# Patient Record
Sex: Male | Born: 1945 | Race: Black or African American | Hispanic: No | Marital: Married | Smoking: Former smoker
Health system: Southern US, Community
[De-identification: ages and names within clinical notes are randomized; demographics above are authoritative.]

## PROBLEM LIST (undated history)

## (undated) DIAGNOSIS — Z860100 Personal history of colon polyps, unspecified: Secondary | ICD-10-CM

## (undated) DIAGNOSIS — M1990 Unspecified osteoarthritis, unspecified site: Secondary | ICD-10-CM

## (undated) DIAGNOSIS — G8929 Other chronic pain: Secondary | ICD-10-CM

## (undated) DIAGNOSIS — K5790 Diverticulosis of intestine, part unspecified, without perforation or abscess without bleeding: Secondary | ICD-10-CM

## (undated) DIAGNOSIS — M5030 Other cervical disc degeneration, unspecified cervical region: Secondary | ICD-10-CM

## (undated) DIAGNOSIS — R768 Other specified abnormal immunological findings in serum: Secondary | ICD-10-CM

## (undated) DIAGNOSIS — E119 Type 2 diabetes mellitus without complications: Secondary | ICD-10-CM

## (undated) DIAGNOSIS — N400 Enlarged prostate without lower urinary tract symptoms: Secondary | ICD-10-CM

## (undated) DIAGNOSIS — Z87438 Personal history of other diseases of male genital organs: Secondary | ICD-10-CM

## (undated) DIAGNOSIS — R972 Elevated prostate specific antigen [PSA]: Secondary | ICD-10-CM

## (undated) DIAGNOSIS — H269 Unspecified cataract: Secondary | ICD-10-CM

## (undated) DIAGNOSIS — M545 Low back pain: Secondary | ICD-10-CM

## (undated) DIAGNOSIS — I10 Essential (primary) hypertension: Secondary | ICD-10-CM

## (undated) DIAGNOSIS — Z87442 Personal history of urinary calculi: Secondary | ICD-10-CM

## (undated) DIAGNOSIS — I82409 Acute embolism and thrombosis of unspecified deep veins of unspecified lower extremity: Secondary | ICD-10-CM

## (undated) DIAGNOSIS — D649 Anemia, unspecified: Secondary | ICD-10-CM

## (undated) DIAGNOSIS — E559 Vitamin D deficiency, unspecified: Secondary | ICD-10-CM

## (undated) DIAGNOSIS — H547 Unspecified visual loss: Secondary | ICD-10-CM

## (undated) DIAGNOSIS — N200 Calculus of kidney: Secondary | ICD-10-CM

## (undated) DIAGNOSIS — M519 Unspecified thoracic, thoracolumbar and lumbosacral intervertebral disc disorder: Secondary | ICD-10-CM

## (undated) DIAGNOSIS — K219 Gastro-esophageal reflux disease without esophagitis: Secondary | ICD-10-CM

## (undated) DIAGNOSIS — E785 Hyperlipidemia, unspecified: Secondary | ICD-10-CM

## (undated) DIAGNOSIS — Z972 Presence of dental prosthetic device (complete) (partial): Secondary | ICD-10-CM

## (undated) DIAGNOSIS — J309 Allergic rhinitis, unspecified: Secondary | ICD-10-CM

## (undated) HISTORY — DX: Hyperlipidemia, unspecified: E78.5

## (undated) HISTORY — DX: Calculus of kidney: N20.0

## (undated) HISTORY — DX: Other specified abnormal immunological findings in serum: R76.8

## (undated) HISTORY — PX: WISDOM TOOTH EXTRACTION: SHX21

## (undated) HISTORY — DX: Anemia, unspecified: D64.9

## (undated) HISTORY — DX: Gastro-esophageal reflux disease without esophagitis: K21.9

## (undated) HISTORY — PX: CYST EXCISION: SHX5701

## (undated) HISTORY — DX: Benign prostatic hyperplasia without lower urinary tract symptoms: N40.0

## (undated) HISTORY — DX: Unspecified thoracic, thoracolumbar and lumbosacral intervertebral disc disorder: M51.9

## (undated) HISTORY — DX: Personal history of other diseases of male genital organs: Z87.438

## (undated) HISTORY — DX: Allergic rhinitis, unspecified: J30.9

## (undated) HISTORY — DX: Other cervical disc degeneration, unspecified cervical region: M50.30

## (undated) HISTORY — PX: POLYPECTOMY: SHX149

## (undated) HISTORY — DX: Unspecified visual loss: H54.7

## (undated) HISTORY — PX: PROSTATE BIOPSY: SHX241

## (undated) HISTORY — DX: Essential (primary) hypertension: I10

## (undated) HISTORY — DX: Unspecified cataract: H26.9

## (undated) HISTORY — PX: BACK SURGERY: SHX140

## (undated) HISTORY — DX: Elevated prostate specific antigen (PSA): R97.20

## (undated) HISTORY — DX: Other chronic pain: G89.29

## (undated) HISTORY — DX: Type 2 diabetes mellitus without complications: E11.9

## (undated) HISTORY — DX: Low back pain: M54.5

---

## 2000-11-20 ENCOUNTER — Inpatient Hospital Stay (HOSPITAL_COMMUNITY): Admission: EM | Admit: 2000-11-20 | Discharge: 2000-11-23 | Payer: Self-pay | Admitting: Emergency Medicine

## 2000-11-20 ENCOUNTER — Encounter: Payer: Self-pay | Admitting: Emergency Medicine

## 2000-11-21 ENCOUNTER — Encounter: Payer: Self-pay | Admitting: Internal Medicine

## 2000-11-22 ENCOUNTER — Encounter: Payer: Self-pay | Admitting: Neurological Surgery

## 2000-11-22 ENCOUNTER — Encounter: Payer: Self-pay | Admitting: Internal Medicine

## 2001-03-03 ENCOUNTER — Encounter: Admission: RE | Admit: 2001-03-03 | Discharge: 2001-06-01 | Payer: Self-pay | Admitting: Neurological Surgery

## 2002-05-28 ENCOUNTER — Ambulatory Visit (HOSPITAL_COMMUNITY): Admission: RE | Admit: 2002-05-28 | Discharge: 2002-05-29 | Payer: Self-pay | Admitting: Neurological Surgery

## 2002-05-28 ENCOUNTER — Encounter: Payer: Self-pay | Admitting: Neurological Surgery

## 2002-09-15 ENCOUNTER — Encounter: Payer: Self-pay | Admitting: Internal Medicine

## 2002-09-15 ENCOUNTER — Encounter: Admission: RE | Admit: 2002-09-15 | Discharge: 2002-09-15 | Payer: Self-pay | Admitting: Internal Medicine

## 2005-02-01 HISTORY — PX: COLONOSCOPY: SHX174

## 2010-12-19 ENCOUNTER — Encounter: Payer: Self-pay | Admitting: Internal Medicine

## 2010-12-19 DIAGNOSIS — Z0001 Encounter for general adult medical examination with abnormal findings: Secondary | ICD-10-CM | POA: Insufficient documentation

## 2010-12-19 DIAGNOSIS — Z0000 Encounter for general adult medical examination without abnormal findings: Secondary | ICD-10-CM | POA: Insufficient documentation

## 2010-12-22 ENCOUNTER — Ambulatory Visit: Payer: Self-pay | Admitting: Internal Medicine

## 2011-02-06 ENCOUNTER — Encounter: Payer: Self-pay | Admitting: Internal Medicine

## 2011-02-06 DIAGNOSIS — H547 Unspecified visual loss: Secondary | ICD-10-CM | POA: Insufficient documentation

## 2011-02-06 DIAGNOSIS — M519 Unspecified thoracic, thoracolumbar and lumbosacral intervertebral disc disorder: Secondary | ICD-10-CM

## 2011-02-06 HISTORY — DX: Unspecified thoracic, thoracolumbar and lumbosacral intervertebral disc disorder: M51.9

## 2011-02-06 HISTORY — DX: Unspecified visual loss: H54.7

## 2011-02-08 ENCOUNTER — Ambulatory Visit (INDEPENDENT_AMBULATORY_CARE_PROVIDER_SITE_OTHER): Payer: Self-pay | Admitting: Internal Medicine

## 2011-02-08 ENCOUNTER — Encounter: Payer: Self-pay | Admitting: Internal Medicine

## 2011-02-08 VITALS — BP 120/72 | HR 72 | Temp 97.5°F | Ht 69.0 in | Wt 189.2 lb

## 2011-02-08 DIAGNOSIS — J309 Allergic rhinitis, unspecified: Secondary | ICD-10-CM

## 2011-02-08 DIAGNOSIS — N400 Enlarged prostate without lower urinary tract symptoms: Secondary | ICD-10-CM | POA: Insufficient documentation

## 2011-02-08 DIAGNOSIS — K219 Gastro-esophageal reflux disease without esophagitis: Secondary | ICD-10-CM

## 2011-02-08 DIAGNOSIS — M545 Low back pain, unspecified: Secondary | ICD-10-CM

## 2011-02-08 DIAGNOSIS — R972 Elevated prostate specific antigen [PSA]: Secondary | ICD-10-CM

## 2011-02-08 DIAGNOSIS — G8929 Other chronic pain: Secondary | ICD-10-CM

## 2011-02-08 HISTORY — DX: Other chronic pain: G89.29

## 2011-02-08 HISTORY — DX: Gastro-esophageal reflux disease without esophagitis: K21.9

## 2011-02-08 HISTORY — DX: Allergic rhinitis, unspecified: J30.9

## 2011-02-08 HISTORY — DX: Benign prostatic hyperplasia without lower urinary tract symptoms: N40.0

## 2011-02-08 HISTORY — DX: Low back pain, unspecified: M54.50

## 2011-02-08 HISTORY — DX: Elevated prostate specific antigen (PSA): R97.20

## 2011-02-08 MED ORDER — ASPIRIN 81 MG PO TBEC: 81.0000 mg | DELAYED_RELEASE_TABLET | Freq: Every day | ORAL | Status: AC

## 2011-02-08 MED ORDER — IBUPROFEN 800 MG PO TABS: 800.0000 mg | ORAL_TABLET | Freq: Three times a day (TID) | ORAL | Status: DC | PRN

## 2011-02-08 MED ORDER — LANSOPRAZOLE 30 MG PO CPDR: 30.0000 mg | DELAYED_RELEASE_CAPSULE | Freq: Every day | ORAL | Status: DC

## 2011-02-08 NOTE — Assessment & Plan Note (Addendum)
stable overall by hx and exam, , and pt to continue medical treatment as before   

## 2011-02-08 NOTE — Progress Notes (Signed)
  Subjective:    Patient ID: Vincent [name], male    DOB: [birth year], 66 y.o.   MRN: [id number]  HPI  Here to establish as new pt; overall doing ok,  Pt denies chest pain, increased sob or doe, wheezing, orthopnea, PND, increased LE swelling, palpitations, dizziness or syncope.  Pt denies new neurological symptoms such as new headache, or facial or extremity weakness or numbness   Pt denies polydipsia, polyuria, Pt states overall good compliance with meds, trying to follow lower cholesterol diet, wt overall stable but little exercise however. Sees Dr nesi/urology every 6 mo with psa; s/p 3 prostate biopsy.   Denies urinary symptoms such as dysuria, frequency, urgency,or hematuria.  For flu shot today.  Denies worsening reflux, dysphagia, abd pain, n/v, bowel change or blood but needs PPI refill.   Does have several wks ongoing nasal allergy symptoms with clear congestion, itch and sneeze, without fever, pain, ST, cough or wheezing, but better with meds.  Pt continues to have recurring LBP without change in severity, bowel or bladder change, fever, wt loss,  worsening LE pain/numbness/weakness, gait change or falls. Past Medical History  Diagnosis Date  . Lumbar disc disease 02/06/2011  . 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 Allergic rhinitis, cause unspecified 02/08/2011  . GERD (gastroesophageal reflux disease) 02/08/2011  . Elevated PSA 02/08/2011  . BPH (benign prostatic hypertrophy) 02/08/2011  . 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  reports that he has never smoked. He does not have any smokeless tobacco history on file. He reports that he does not drink alcohol or use illicit drugs. family history includes Blindness in his other; Diabetes in his other; Heart disease in his other; Hypertension in his other; Mental illness in his other; and Sudden death in his other. No Known Allergies No current outpatient prescriptions on file prior to visit.   Review of Systems Review of Systems    Constitutional: Negative for diaphoresis and unexpected weight change.  HENT: Negative for drooling and tinnitus.   Eyes: Negative for photophobia and visual disturbance.  Respiratory: Negative for choking and stridor.   Gastrointestinal: Negative for vomiting and blood in stool.  Genitourinary: Negative for hematuria and decreased urine volume.  Musculoskeletal: Negative for gait problem.  Skin: Negative for color change and wound.  Neurological: Negative for tremors and numbness.  Psychiatric/Behavioral: Negative for decreased concentration. The patient is not hyperactive.       Objective:   Physical Exam BP 120/72  Pulse 72  Temp(Src) 97.5 F (36.4 C) (Oral)  Ht 5\' 9"  (1.753 m)  Wt 189 lb 4 oz (85.843 kg)  BMI 27.95 kg/m2  SpO2 95% Physical Exam  VS noted,  Constitutional: Pt appears well-developed and well-nourished.  HENT: Head: Normocephalic.  Right Ear: External ear normal.  Left Ear: External ear normal.  Eyes: Conjunctivae and EOM are normal. Pupils are equal, round, and reactive to light.  Neck: Normal range of motion. Neck supple.  Cardiovascular: Normal rate and regular rhythm.   Pulmonary/Chest: Effort normal and breath sounds normal.  Abd:  Soft, NT, non-distended, + BS Neurological: Pt is alert. No cranial nerve deficit.  Skin: Skin is warm. No erythema.  Psychiatric: Pt behavior is normal. Thought content normal.     Assessment & Plan:

## 2011-02-08 NOTE — Assessment & Plan Note (Signed)
stable overall by hx and exam, and pt to continue medical treatment as before 

## 2011-02-08 NOTE — Assessment & Plan Note (Signed)
Mild to mod, for OTC allegra prn,  to f/u any worsening symptoms or concerns 

## 2011-02-08 NOTE — Patient Instructions (Addendum)
Please sign Release of Information Form to get records from Dr Eula Listen We will try to get last colonscopy record from Dr Alvis Lemmings had the flu shot today Please start the Aspirin 81 mg - 1 per day - COATED only Your ibuprofen refill was done today Please return in May 2013, or sooner if needed

## 2011-04-06 ENCOUNTER — Telehealth: Payer: Self-pay | Admitting: Internal Medicine

## 2011-04-06 NOTE — Telephone Encounter (Signed)
Received copies from [facility] ,on3/4/13 . Forwarded  15 pages to Dr Jonny Ruiz. ,for review.

## 2011-04-11 ENCOUNTER — Encounter: Payer: Self-pay | Admitting: Internal Medicine

## 2011-04-11 DIAGNOSIS — R768 Other specified abnormal immunological findings in serum: Secondary | ICD-10-CM | POA: Insufficient documentation

## 2011-04-11 DIAGNOSIS — M5030 Other cervical disc degeneration, unspecified cervical region: Secondary | ICD-10-CM

## 2011-04-11 DIAGNOSIS — Z87438 Personal history of other diseases of male genital organs: Secondary | ICD-10-CM

## 2011-04-11 DIAGNOSIS — D649 Anemia, unspecified: Secondary | ICD-10-CM

## 2011-04-11 HISTORY — DX: Other cervical disc degeneration, unspecified cervical region: M50.30

## 2011-04-11 HISTORY — DX: Other specified abnormal immunological findings in serum: R76.8

## 2011-04-11 HISTORY — DX: Anemia, unspecified: D64.9

## 2011-04-11 HISTORY — DX: Personal history of other diseases of male genital organs: Z87.438

## 2011-05-26 ENCOUNTER — Telehealth: Payer: Self-pay | Admitting: Gastroenterology

## 2011-05-26 NOTE — Telephone Encounter (Signed)
Wrong patient error...em

## 2011-06-08 ENCOUNTER — Ambulatory Visit: Payer: 59 | Admitting: Internal Medicine

## 2011-06-11 ENCOUNTER — Ambulatory Visit: Payer: 59 | Admitting: Internal Medicine

## 2011-07-02 ENCOUNTER — Other Ambulatory Visit (INDEPENDENT_AMBULATORY_CARE_PROVIDER_SITE_OTHER): Payer: 59

## 2011-07-02 ENCOUNTER — Other Ambulatory Visit: Payer: Self-pay | Admitting: Internal Medicine

## 2011-07-02 ENCOUNTER — Ambulatory Visit (INDEPENDENT_AMBULATORY_CARE_PROVIDER_SITE_OTHER): Payer: 59 | Admitting: Internal Medicine

## 2011-07-02 ENCOUNTER — Encounter: Payer: Self-pay | Admitting: Internal Medicine

## 2011-07-02 VITALS — BP 110/78 | HR 72 | Temp 97.4°F | Ht 69.5 in | Wt 174.0 lb

## 2011-07-02 DIAGNOSIS — Z125 Encounter for screening for malignant neoplasm of prostate: Secondary | ICD-10-CM

## 2011-07-02 DIAGNOSIS — R972 Elevated prostate specific antigen [PSA]: Secondary | ICD-10-CM

## 2011-07-02 DIAGNOSIS — Z0000 Encounter for general adult medical examination without abnormal findings: Secondary | ICD-10-CM

## 2011-07-02 DIAGNOSIS — Z79899 Other long term (current) drug therapy: Secondary | ICD-10-CM

## 2011-07-02 DIAGNOSIS — J309 Allergic rhinitis, unspecified: Secondary | ICD-10-CM

## 2011-07-02 LAB — CBC WITH DIFFERENTIAL/PLATELET
Basophils Relative: 0.4 % (ref 0.0–3.0)
Eosinophils Absolute: 0.5 10*3/uL (ref 0.0–0.7)
Eosinophils Relative: 11.9 % — ABNORMAL HIGH (ref 0.0–5.0)
HCT: 41.4 % (ref 39.0–52.0)
Hemoglobin: 13.1 g/dL (ref 13.0–17.0)
Lymphs Abs: 1.8 10*3/uL (ref 0.7–4.0)
MCHC: 31.7 g/dL (ref 30.0–36.0)
MCV: 87.4 fl (ref 78.0–100.0)
Monocytes Absolute: 0.4 10*3/uL (ref 0.1–1.0)
Neutro Abs: 1.6 10*3/uL (ref 1.4–7.7)
Neutrophils Relative %: 37.5 % — ABNORMAL LOW (ref 43.0–77.0)
RBC: 4.74 Mil/uL (ref 4.22–5.81)
WBC: 4.3 10*3/uL — ABNORMAL LOW (ref 4.5–10.5)

## 2011-07-02 LAB — URINALYSIS, ROUTINE W REFLEX MICROSCOPIC
Bilirubin Urine: NEGATIVE
Hgb urine dipstick: NEGATIVE
Ketones, ur: NEGATIVE
Leukocytes, UA: NEGATIVE
Nitrite: NEGATIVE
Urobilinogen, UA: 0.2 (ref 0.0–1.0)
pH: 6 (ref 5.0–8.0)

## 2011-07-02 LAB — HEPATIC FUNCTION PANEL
ALT: 16 U/L (ref 0–53)
Albumin: 3.7 g/dL (ref 3.5–5.2)
Bilirubin, Direct: 0.1 mg/dL (ref 0.0–0.3)
Total Protein: 7.2 g/dL (ref 6.0–8.3)

## 2011-07-02 LAB — BASIC METABOLIC PANEL
BUN: 12 mg/dL (ref 6–23)
Chloride: 104 mEq/L (ref 96–112)
Creatinine, Ser: 0.8 mg/dL (ref 0.4–1.5)
GFR: 117.78 mL/min (ref >60.00)
Potassium: 4.2 mEq/L (ref 3.5–5.1)

## 2011-07-02 LAB — LIPID PANEL
Cholesterol: 163 mg/dL (ref 0–200)
Triglycerides: 51 mg/dL (ref 0.0–149.0)
VLDL: 10.2 mg/dL (ref 0.0–40.0)

## 2011-07-02 LAB — PSA: PSA: 10.09 ng/mL — ABNORMAL HIGH (ref 0.10–4.00)

## 2011-07-02 NOTE — Patient Instructions (Signed)
Please try allegra 180 mg per day and mucinex twice per day as needed for the left inner ear stopping up (probably related to mild allergies) OK to try the "ear wax removal kit" for the wax to the right ear Continue all other medications as before Please have the pharmacy call with any refills you may need. Please continue your efforts at being more active, low cholesterol diet, and weight control. Please go to LAB in the Basement for the blood and/or urine tests to be done today You will be contacted by phone if any changes need to be made immediately.  Otherwise, you will receive a letter about your results with an explanation. You are otherwise up to date with prevention Please return in 1 year for your yearly visit, or sooner if needed, with Lab testing done 3-5 days before

## 2011-07-03 ENCOUNTER — Encounter: Payer: Self-pay | Admitting: Internal Medicine

## 2011-07-03 NOTE — Progress Notes (Signed)
Subjective:    Patient ID: Vincent [name], male    DOB: May 13, 1945, 67 y.o.   MRN: [id number]  HPI  Here for wellness and f/u;  Overall doing ok;  Pt denies CP, worsening SOB, DOE, wheezing, orthopnea, PND, worsening LE edema, palpitations, dizziness or syncope.  Pt denies neurological change such as new Headache, facial or extremity weakness.  Pt denies polydipsia, polyuria, or low sugar symptoms. Pt states overall good compliance with treatment and medications, good tolerability, and trying to follow lower cholesterol diet.  Pt denies worsening depressive symptoms, suicidal ideation or panic. No fever, wt loss, night sweats, loss of appetite, or other constitutional symptoms.  Pt states good ability with ADL's, low fall risk, home safety reviewed and adequate, no significant changes in hearing or vision, and occasionally active with exercise.  Does have left ear popping and crackling, in addition to mild nasal allergy symtpoms. Past Medical History  Diagnosis Date  . Lumbar disc disease 02/06/2011  . 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 Allergic rhinitis, cause unspecified 02/08/2011  . GERD (gastroesophageal reflux disease) 02/08/2011  . Elevated PSA 02/08/2011  . BPH (benign prostatic hypertrophy) 02/08/2011  . 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 History of prostatitis 04/11/2011  . 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 Anemia, unspecified 04/11/2011   No past surgical history on file.  reports that he has never smoked. He does not have any smokeless tobacco history on file. He reports that he does not drink alcohol or use illicit drugs. family history includes Blindness in his other; Diabetes in his other; Heart disease in his other; Hypertension in his other; Mental illness in his other; and Sudden death in his other. No Known Allergies Current Outpatient Prescriptions on File Prior to Visit  Medication Sig Dispense Refill  . aspirin 81 MG EC tablet Take 1  tablet (81 mg total) by mouth daily. Swallow whole.  30 tablet  12  . lansoprazole (PREVACID) 30 MG capsule Take 1 capsule (30 mg total) by mouth daily.  90 capsule  3   Review of Systems Review of Systems  Constitutional: Negative for diaphoresis, activity change, appetite change and unexpected weight change.  HENT: Negative for hearing loss, ear pain, facial swelling, mouth sores and neck stiffness.   Eyes: Negative for pain, redness and visual disturbance.  Respiratory: Negative for shortness of breath and wheezing.   Cardiovascular: Negative for chest pain and palpitations.  Gastrointestinal: Negative for diarrhea, blood in stool, abdominal distention and rectal pain.  Genitourinary: Negative for hematuria, flank pain and decreased urine volume.  Musculoskeletal: Negative for myalgias and joint swelling.  Skin: Negative for color change and wound.  Neurological: Negative for syncope and numbness.  Hematological: Negative for adenopathy.  Psychiatric/Behavioral: Negative for hallucinations, self-injury, decreased concentration and agitation.      Objective:   Physical Exam BP 110/78  Pulse 72  Temp(Src) 97.4 F (36.3 C) (Oral)  Ht 5' 9.5" (1.765 m)  Wt 174 lb (78.926 kg)  BMI 25.33 kg/m2  SpO2 99% Physical Exam  VS noted, not ill appaering Constitutional: Pt is oriented to person, place, and time. Appears well-developed and well-nourished.  HENT:  Head: Normocephalic and atraumatic.  Right Ear: External ear normal.  Left Ear: External ear normal.  Nose: Nose normal.  Mouth/Throat: Oropharynx is clear and moist. Bilat tm's mild erythema.  Sinus nontender.  Pharynx mild erythema  Right canal with mild wax impaction Eyes: Conjunctivae and EOM are normal. Pupils are  equal, round, and reactive to light.  Neck: Normal range of motion. Neck supple. No JVD present. No tracheal deviation present.  Cardiovascular: Normal rate, regular rhythm, normal heart sounds and intact distal  pulses.   Pulmonary/Chest: Effort normal and breath sounds normal.  Abdominal: Soft. Bowel sounds are normal. There is no tenderness.  Musculoskeletal: Normal range of motion. Exhibits no edema.  Lymphadenopathy:  Has no cervical adenopathy.  Neurological: Pt is alert and oriented to person, place, and time. Pt has normal reflexes. No cranial nerve deficit. Motor intact  Skin: Skin is warm and dry. No rash noted.  Psychiatric:  Has  normal mood and affect. Behavior is normal.     Assessment & Plan:

## 2011-07-03 NOTE — Assessment & Plan Note (Signed)

## 2011-07-03 NOTE — Assessment & Plan Note (Signed)
For otc allegra prn,  to f/u any worsening symptoms or concerns  

## 2012-02-01 ENCOUNTER — Ambulatory Visit (INDEPENDENT_AMBULATORY_CARE_PROVIDER_SITE_OTHER): Payer: 59 | Admitting: Family Medicine

## 2012-02-01 VITALS — BP 109/78 | HR 108 | Temp 101.0°F | Resp 18 | Ht 69.5 in | Wt 174.2 lb

## 2012-02-01 DIAGNOSIS — R509 Fever, unspecified: Secondary | ICD-10-CM

## 2012-02-01 LAB — POCT CBC
Hemoglobin: 13.9 g/dL — AB (ref 14.1–18.1)
Lymph, poc: 0.9 (ref 0.6–3.4)
MCH, POC: 27 pg (ref 27–31.2)
MCHC: 30.1 g/dL — AB (ref 31.8–35.4)
MPV: 8.6 fL (ref 0–99.8)
POC LYMPH PERCENT: 14.6 %L (ref 10–50)
POC MID %: 6.8 %M (ref 0–12)
RDW, POC: 13.9 %
WBC: 6 10*3/uL (ref 4.6–10.2)

## 2012-02-01 LAB — POCT URINALYSIS DIPSTICK
Blood, UA: NEGATIVE
Glucose, UA: NEGATIVE
Nitrite, UA: NEGATIVE
Urobilinogen, UA: 1
pH, UA: 5.5

## 2012-02-01 LAB — POCT UA - MICROSCOPIC ONLY
Casts, Ur, LPF, POC: NEGATIVE
Mucus, UA: POSITIVE
Yeast, UA: NEGATIVE

## 2012-02-01 MED ORDER — CEPHALEXIN 500 MG PO CAPS: 500.0000 mg | ORAL_CAPSULE | Freq: Four times a day (QID) | ORAL | Status: DC

## 2012-02-01 MED ORDER — OSELTAMIVIR PHOSPHATE 75 MG PO CAPS: 75.0000 mg | ORAL_CAPSULE | Freq: Two times a day (BID) | ORAL | Status: DC

## 2012-02-01 MED ORDER — IBUPROFEN 200 MG PO TABS: 600.0000 mg | ORAL_TABLET | Freq: Once | ORAL | Status: DC

## 2012-02-01 NOTE — Patient Instructions (Signed)
Alternate 650mg  (2 tabs of the regular strength) of tylenol with 600mg  (3 tabs) of ibuprofen every 3 hours so that you are taking medicine every 6 hours.  This will help keep your fever down and aches away.

## 2012-02-01 NOTE — Progress Notes (Signed)
Subjective:    Patient ID: Vincent [name], male    DOB: 06-28-1945, 66 y.o.   MRN: [id number] Chief Complaint  Patient presents with  . Fever    Started yesterday  . Cough  . Generalized Body Aches    HPI  Vincent [name] is a 66 yo male with a history significant for blindness, hepatitis B, and a h/o HepB. He was in his normal state of health until about 24 hrs previously when he developed a HA, myalgias - esp in legs and arms, and fever/chills. Today his temp was up to 102 at home so they decided to come in. He did not get flu shot this year and his wife has also been feeling ill.  No neck stiffness or pain, no upper respiratory sxs in general, no nasal sxs, no sore throat, is having a dry cough. No abd pain, no n/v.  Is urinating more frequent, no dysuria or hesitency.   Has not taken any medications today for sx relief including ibuprofen or tylenol today.  Has not been drinking or eating much today, just no appetite, just has had some OJ but not much else since breakfast, normal BM, feels weak when stands, no DOE, SHoB, no CP. Past Medical History  Diagnosis Date  . Lumbar disc disease 02/06/2011  . 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 Allergic rhinitis, cause unspecified 02/08/2011  . GERD (gastroesophageal reflux disease) 02/08/2011  . Elevated PSA 02/08/2011  . BPH (benign prostatic hypertrophy) 02/08/2011  . 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 History of prostatitis 04/11/2011  . 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 Anemia, unspecified 04/11/2011   Current Outpatient Prescriptions on File Prior to Visit  Medication Sig Dispense Refill  . aspirin 81 MG EC tablet Take 1 tablet (81 mg total) by mouth daily. Swallow whole.  30 tablet  12  . lansoprazole (PREVACID) 30 MG capsule Take 1 capsule (30 mg total) by mouth daily.  90 capsule  3   No Known Allergies   Review of Systems  Constitutional: Positive for fever, chills, diaphoresis, activity  change, appetite change and fatigue. Negative for unexpected weight change.  HENT: Negative for ear pain, nosebleeds, congestion, sore throat, rhinorrhea, sneezing, neck pain, neck stiffness and sinus pressure.   Respiratory: Positive for cough. Negative for chest tightness, shortness of breath and wheezing.   Cardiovascular: Negative for chest pain.  Gastrointestinal: Negative for nausea, vomiting, abdominal pain, diarrhea and constipation.  Genitourinary: Negative for dysuria.  Musculoskeletal: Positive for myalgias and arthralgias. Negative for back pain, joint swelling and gait problem.  Skin: Negative for rash.  Neurological: Positive for headaches. Negative for syncope.  Hematological: Negative for adenopathy.  Psychiatric/Behavioral: Negative for sleep disturbance.      BP 118/82  Pulse 111  Temp 101 F (38.3 C) (Oral)  Resp 18  Ht 5' 9.5" (1.765 m)  Wt 174 lb 3.2 oz (79.017 kg)  BMI 25.36 kg/m2  SpO2 93% Objective:   Physical Exam  Constitutional: He is oriented to person, place, and time. He appears well-developed and well-nourished. No distress.  HENT:  Head: Normocephalic and atraumatic.  Right Ear: Tympanic membrane, external ear and ear canal normal.  Left Ear: Tympanic membrane, external ear and ear canal normal.  Nose: Nose normal.  Mouth/Throat: Oropharynx is clear and moist and mucous membranes are normal. No oropharyngeal exudate.  Neck: Normal range of motion. Neck supple. No thyromegaly present.  Cardiovascular: Normal rate, regular rhythm, normal heart sounds  and intact distal pulses.   Pulmonary/Chest: Effort normal and breath sounds normal. No respiratory distress.  Lymphadenopathy:       Head (right side): Submandibular adenopathy present.       Head (left side): Submandibular adenopathy present.    He has cervical adenopathy.       Right cervical: Superficial cervical adenopathy present.       Right: No supraclavicular adenopathy present.       Left:  No supraclavicular adenopathy present.  Neurological: He is alert and oriented to person, place, and time.  Skin: Skin is warm and dry. He is not diaphoretic. No erythema.  Psychiatric: He has a normal mood and affect. His behavior is normal.       Given 600mg  ibuprofen po x 1 in office now.  Temp came down to 99.5 and O2 sat up to 96%. Still tachycardic with low BP. Results for orders placed in visit on 02/01/12  POCT INFLUENZA A/B      Component Value Range   Influenza A, POC Negative     Influenza B, POC Negative    POCT CBC      Component Value Range   WBC 6.0  4.6 - 10.2 K/uL   Lymph, poc 0.9  0.6 - 3.4   POC LYMPH PERCENT 14.6  10 - 50 %L   MID (cbc) 0.4  0 - 0.9   POC MID % 6.8  0 - 12 %M   POC Granulocyte 4.7  2 - 6.9   Granulocyte percent 78.6  37 - 80 %G   RBC 5.14  4.69 - 6.13 M/uL   Hemoglobin 13.9 (*) 14.1 - 18.1 g/dL   HCT, POC 16.1  09.6 - 53.7 %   MCV 89.9  80 - 97 fL   MCH, POC 27.0  27 - 31.2 pg   MCHC 30.1 (*) 31.8 - 35.4 g/dL   RDW, POC 04.5     Platelet Count, POC 181  142 - 424 K/uL   MPV 8.6  0 - 99.8 fL  POCT UA - MICROSCOPIC ONLY      Component Value Range   WBC, Ur, HPF, POC 1-8     RBC, urine, microscopic 0-3     Bacteria, U Microscopic 1+     Mucus, UA pos     Epithelial cells, urine per micros 0-1     Crystals, Ur, HPF, POC neg     Casts, Ur, LPF, POC neg     Yeast, UA neg    POCT URINALYSIS DIPSTICK      Component Value Range   Color, UA amber     Clarity, UA clear     Glucose, UA neg     Bilirubin, UA small     Ketones, UA 15     Spec Grav, UA 1.020     Blood, UA neg     pH, UA 5.5     Protein, UA 30     Urobilinogen, UA 1.0     Nitrite, UA neg     Leukocytes, UA Trace      Assessment & Plan:  Fever - Unknown origin. I truly think it is flu despite neg test. UClx pending. Due to h/o UTI and prostatitis will cover w/ Keflex (chosen since it would cover pulm pathogens as well) as well as starting Tamiflu. Alt tylenol and ibuprofen  every 3 hrs to suppress fever. Push fluids - importance of this reinforced and pt agrees - does not  need any nausea medicine for this.  Fast-track card given for recheck in 1-2d - if diagnosis still unclear, I would consider repeating flu test or checking a CXR.  F/u tomorrow if not able to drink much or any decrease in urination.

## 2012-02-04 ENCOUNTER — Ambulatory Visit (INDEPENDENT_AMBULATORY_CARE_PROVIDER_SITE_OTHER): Payer: 59 | Admitting: Emergency Medicine

## 2012-02-04 VITALS — BP 118/82 | HR 80 | Temp 98.4°F | Resp 18 | Ht 69.5 in | Wt 181.0 lb

## 2012-02-04 DIAGNOSIS — J111 Influenza due to unidentified influenza virus with other respiratory manifestations: Secondary | ICD-10-CM

## 2012-02-04 LAB — URINE CULTURE: Colony Count: 10000

## 2012-02-04 NOTE — Progress Notes (Signed)
[facility] [address], [place] [address] [phone]  Date:  02/04/2012   Name:  Vincent [name]   DOB:  1945-11-20   MRN:  [id number]  PCP:  Oliver Barre, MD    Chief Complaint: Follow-up   History of Present Illness:  Vincent [name] is a 67 y.o. very pleasant male patient who presents with the following:  Seen on 02/01/12 with influenza and on tamiflu.  Tolerating medication and is improving clinically.  No further fever or cough.  No nausea or vomiting.  No wheezing or shortness of breath.  Eating well.  Patient Active Problem List  Diagnosis  . Preventative health care  . Lumbar disc disease  . Blindness  . Allergic rhinitis, cause unspecified  . GERD (gastroesophageal reflux disease)  . Elevated PSA  . BPH (benign prostatic hypertrophy)  . Chronic LBP  . History of prostatitis  . Hepatitis B antibody positive  . DDD (degenerative disc disease), cervical  . Anemia, unspecified    Past Medical History  Diagnosis Date  . Lumbar disc disease 02/06/2011  . 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 Allergic rhinitis, cause unspecified 02/08/2011  . GERD (gastroesophageal reflux disease) 02/08/2011  . Elevated PSA 02/08/2011  . BPH (benign prostatic hypertrophy) 02/08/2011  . 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 History of prostatitis 04/11/2011  . 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 Anemia, unspecified 04/11/2011    No past surgical history on file.  History  Substance Use Topics  . Smoking status: Never Smoker   . Smokeless tobacco: Not on file  . Alcohol Use: No    Family History  Problem Relation Age of Onset  . Blindness Other   . Heart disease Other   . Hypertension Other   . Diabetes Other   . Mental illness Other   . Sudden death Other     No Known Allergies  Medication list has been reviewed and updated.  Current Outpatient Prescriptions on File Prior to Visit  Medication Sig  Dispense Refill  . aspirin 81 MG EC tablet Take 1 tablet (81 mg total) by mouth daily. Swallow whole.  30 tablet  12  . cephALEXin (KEFLEX) 500 MG capsule Take 1 capsule (500 mg total) by mouth 4 (four) times daily.  28 capsule  0  . lansoprazole (PREVACID) 30 MG capsule Take 1 capsule (30 mg total) by mouth daily.  90 capsule  3  . oseltamivir (TAMIFLU) 75 MG capsule Take 1 capsule (75 mg total) by mouth 2 (two) times daily.  10 capsule  0    Review of Systems:  As per HPI, otherwise negative.    Physical Examination: Filed Vitals:   02/04/12 1654  BP: 118/82  Pulse: 80  Temp: 98.4 F (36.9 C)  Resp: 18   Filed Vitals:   02/04/12 1654  Height: 5' 9.5" (1.765 m)  Weight: 181 lb (82.101 kg)   Body mass index is 26.35 kg/(m^2). Ideal Body Weight: Weight in (lb) to have BMI = 25: 171.4   GEN: WDWN, NAD, Non-toxic, A & O x 3 HEENT: Atraumatic, Normocephalic. Neck supple. No masses, No LAD. Ears and Nose: No external deformity. CV: RRR, No M/G/R. No JVD. No thrill. No extra heart sounds. PULM: CTA B, no wheezes, crackles, rhonchi. No retractions. No resp. distress. No accessory muscle use. ABD: S, NT, ND, +BS. No rebound. No HSM. EXTR: No c/c/e NEURO Normal gait.  PSYCH: Normally interactive. Conversant.  Not depressed or anxious appearing.  Calm demeanor.    Assessment and Plan: Influenza Follow up as needed  Carmelina Dane, MD

## 2012-08-01 ENCOUNTER — Encounter: Payer: Self-pay | Admitting: Internal Medicine

## 2012-08-01 ENCOUNTER — Ambulatory Visit (INDEPENDENT_AMBULATORY_CARE_PROVIDER_SITE_OTHER): Payer: 59 | Admitting: Internal Medicine

## 2012-08-01 ENCOUNTER — Other Ambulatory Visit (INDEPENDENT_AMBULATORY_CARE_PROVIDER_SITE_OTHER): Payer: Medicare Other

## 2012-08-01 VITALS — BP 132/80 | HR 59 | Temp 97.0°F | Ht 69.5 in | Wt 181.5 lb

## 2012-08-01 DIAGNOSIS — D649 Anemia, unspecified: Secondary | ICD-10-CM

## 2012-08-01 DIAGNOSIS — N400 Enlarged prostate without lower urinary tract symptoms: Secondary | ICD-10-CM

## 2012-08-01 DIAGNOSIS — Z0000 Encounter for general adult medical examination without abnormal findings: Secondary | ICD-10-CM

## 2012-08-01 DIAGNOSIS — R972 Elevated prostate specific antigen [PSA]: Secondary | ICD-10-CM

## 2012-08-01 DIAGNOSIS — Z23 Encounter for immunization: Secondary | ICD-10-CM

## 2012-08-01 LAB — LIPID PANEL
Cholesterol: 183 mg/dL (ref 0–200)
HDL: 44.3 mg/dL (ref >39.00)
LDL Cholesterol: 114 mg/dL — ABNORMAL HIGH (ref 0–99)
Triglycerides: 125 mg/dL (ref 0.0–149.0)
VLDL: 25 mg/dL (ref 0.0–40.0)

## 2012-08-01 LAB — CBC WITH DIFFERENTIAL/PLATELET
Basophils Relative: 0.3 % (ref 0.0–3.0)
Eosinophils Absolute: 0.2 10*3/uL (ref 0.0–0.7)
MCHC: 32.5 g/dL (ref 30.0–36.0)
MCV: 88.6 fl (ref 78.0–100.0)
Monocytes Absolute: 0.6 10*3/uL (ref 0.1–1.0)
Neutro Abs: 2.2 10*3/uL (ref 1.4–7.7)
Neutrophils Relative %: 45.5 % (ref 43.0–77.0)
RBC: 4.77 Mil/uL (ref 4.22–5.81)

## 2012-08-01 LAB — URINALYSIS, ROUTINE W REFLEX MICROSCOPIC
Hgb urine dipstick: NEGATIVE
Ketones, ur: NEGATIVE
Nitrite: NEGATIVE
Urine Glucose: NEGATIVE

## 2012-08-01 LAB — HEPATIC FUNCTION PANEL
Albumin: 4 g/dL (ref 3.5–5.2)
Bilirubin, Direct: 0.1 mg/dL (ref 0.0–0.3)
Total Protein: 7.7 g/dL (ref 6.0–8.3)

## 2012-08-01 LAB — BASIC METABOLIC PANEL
Chloride: 105 mEq/L (ref 96–112)
Potassium: 4.1 mEq/L (ref 3.5–5.1)
Sodium: 139 mEq/L (ref 135–145)

## 2012-08-01 LAB — PSA: PSA: 12.48 ng/mL — ABNORMAL HIGH (ref 0.10–4.00)

## 2012-08-01 MED ORDER — LANSOPRAZOLE 30 MG PO CPDR: 30.0000 mg | DELAYED_RELEASE_CAPSULE | Freq: Every day | ORAL | Status: DC | PRN

## 2012-08-01 MED ORDER — ASPIRIN 81 MG PO TBEC: 81.0000 mg | DELAYED_RELEASE_TABLET | Freq: Every day | ORAL | Status: AC

## 2012-08-01 NOTE — Patient Instructions (Signed)
You had the pneumonia shot today Please continue all other medications as before, the prevacid Please have the pharmacy call with any other refills you may need. Please also start Aspirin 81 mg - 1 per day (Enteric coated only) Please continue your efforts at being more active, low cholesterol diet, and weight control. You are otherwise up to date with prevention measures today. Please go to the LAB in the Basement (turn left off the elevator) for the tests to be done today You will be contacted by phone if any changes need to be made immediately.  Otherwise, you will receive a letter about your results with an explanation, but please check with MyChart first.  Please remember to sign up for My Chart if you have not done so, as this will be important to you in the future with finding out test results, communicating by private email, and scheduling acute appointments online when needed.  Please return in 1 year for your yearly visit, or sooner if needed

## 2012-08-01 NOTE — Assessment & Plan Note (Addendum)
Overall doing well, age appropriate education and counseling updated, referrals for preventative services and immunizations addressed, dietary and smoking counseling addressed, most recent labs reviewed.  I have personally reviewed and have noted: 1) the patient's medical and social history 2) The pt's use of alcohol, tobacco, and illicit drugs 3) The patient's current medications and supplements 4) Functional ability including ADL's, fall risk, home safety risk, hearing and visual impairment 5) Diet and physical activities 6) Evidence for depression or mood disorder 7) The patient's height, weight, and BMI have been recorded in the chart I have made referrals, and provided counseling and education based on review of the above ECG reviewed as per emr To start asa 81 mg, for pnuemovax today, and lab f/u.

## 2012-08-01 NOTE — Progress Notes (Signed)
Subjective:    Patient ID: Vincent [name], male    DOB: [birth year], 67 y.o.   MRN: [id number]  HPI  Here for wellness and f/u;  Overall doing ok;  Pt denies CP, worsening SOB, DOE, wheezing, orthopnea, PND, worsening LE edema, palpitations, dizziness or syncope.  Pt denies neurological change such as new headache, facial or extremity weakness.  Pt denies polydipsia, polyuria, or low sugar symptoms. Pt states overall good compliance with treatment and medications, good tolerability, and has been trying to follow lower cholesterol diet.  Pt denies worsening depressive symptoms, suicidal ideation or panic. No fever, night sweats, wt loss, loss of appetite, or other constitutional symptoms.  Pt states good ability with ADL's, has low fall risk, home safety reviewed and adequate, no other significant changes in hearing or vision, and only occasionally active with exercise.  No acute complaints Past Medical History  Diagnosis Date  . Lumbar disc disease 02/06/2011  . 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 Allergic rhinitis, cause unspecified 02/08/2011  . GERD (gastroesophageal reflux disease) 02/08/2011  . Elevated PSA 02/08/2011  . BPH (benign prostatic hypertrophy) 02/08/2011  . 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 History of prostatitis 04/11/2011  . 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 Anemia, unspecified 04/11/2011   No past surgical history on file.  reports that he has never smoked. He does not have any smokeless tobacco history on file. He reports that he does not drink alcohol or use illicit drugs. family history includes Blindness in his other; Diabetes in his other; Heart disease in his other; Hypertension in his other; Mental illness in his other; and Sudden death in his other. No Known Allergies No current outpatient prescriptions on file prior to visit.   No current facility-administered medications on file prior to visit.   Review of  Systems Constitutional: Negative for diaphoresis, activity change, appetite change or unexpected weight change.  HENT: Negative for hearing loss, ear pain, facial swelling, mouth sores and neck stiffness.   Eyes: Negative for pain, redness and visual disturbance.  Respiratory: Negative for shortness of breath and wheezing.   Cardiovascular: Negative for chest pain and palpitations.  Gastrointestinal: Negative for diarrhea, blood in stool, abdominal distention or other pain Genitourinary: Negative for hematuria, flank pain or change in urine volume.  Musculoskeletal: Negative for myalgias and joint swelling.  Skin: Negative for color change and wound.  Neurological: Negative for syncope and numbness. other than noted Hematological: Negative for adenopathy.  Psychiatric/Behavioral: Negative for hallucinations, self-injury, decreased concentration and agitation.      Objective:   Physical Exam BP 132/80  Pulse 59  Temp(Src) 97 F (36.1 C) (Oral)  Ht 5' 9.5" (1.765 m)  Wt 181 lb 8 oz (82.328 kg)  BMI 26.43 kg/m2  SpO2 98% VS noted,  Constitutional: Pt is oriented to person, place, and time. Appears well-developed and well-nourished.  Head: Normocephalic and atraumatic.  Right Ear: External ear normal.  Left Ear: External ear normal.  Nose: Nose normal.  Mouth/Throat: Oropharynx is clear and moist.  Eyes: Conjunctivae and EOM are normal. Pupils are equal, round, and reactive to light.  Neck: Normal range of motion. Neck supple. No JVD present. No tracheal deviation present.  Cardiovascular: Normal rate, regular rhythm, normal heart sounds and intact distal pulses.   Pulmonary/Chest: Effort normal and breath sounds normal.  Abdominal: Soft. Bowel sounds are normal. There is no tenderness. No HSM  Musculoskeletal: Normal range of motion. Exhibits no edema.  Lymphadenopathy:  Has no cervical adenopathy.  Neurological: Pt is alert and oriented to person, place, and time. Pt has normal  reflexes. No cranial nerve deficit.  Skin: Skin is warm and dry. No rash noted.  Psychiatric:  Has  normal mood and affect. Behavior is normal.     Assessment & Plan:

## 2012-08-02 ENCOUNTER — Encounter: Payer: Self-pay | Admitting: Internal Medicine

## 2012-12-05 ENCOUNTER — Other Ambulatory Visit: Payer: Self-pay | Admitting: Internal Medicine

## 2012-12-05 ENCOUNTER — Encounter (HOSPITAL_COMMUNITY): Payer: Self-pay | Admitting: Emergency Medicine

## 2012-12-05 ENCOUNTER — Emergency Department (HOSPITAL_COMMUNITY)
Admission: EM | Admit: 2012-12-05 | Discharge: 2012-12-05 | Disposition: A | Discharge status: Home / Self Care | Payer: Medicare Other | Attending: Emergency Medicine | Admitting: Emergency Medicine

## 2012-12-05 DIAGNOSIS — M5030 Other cervical disc degeneration, unspecified cervical region: Secondary | ICD-10-CM | POA: Insufficient documentation

## 2012-12-05 DIAGNOSIS — N419 Inflammatory disease of prostate, unspecified: Secondary | ICD-10-CM

## 2012-12-05 DIAGNOSIS — K219 Gastro-esophageal reflux disease without esophagitis: Secondary | ICD-10-CM | POA: Insufficient documentation

## 2012-12-05 DIAGNOSIS — N410 Acute prostatitis: Secondary | ICD-10-CM | POA: Insufficient documentation

## 2012-12-05 DIAGNOSIS — R894 Abnormal immunological findings in specimens from other organs, systems and tissues: Secondary | ICD-10-CM | POA: Insufficient documentation

## 2012-12-05 DIAGNOSIS — Z79899 Other long term (current) drug therapy: Secondary | ICD-10-CM | POA: Insufficient documentation

## 2012-12-05 DIAGNOSIS — H543 Unqualified visual loss, both eyes: Secondary | ICD-10-CM | POA: Insufficient documentation

## 2012-12-05 DIAGNOSIS — M545 Low back pain, unspecified: Secondary | ICD-10-CM | POA: Insufficient documentation

## 2012-12-05 DIAGNOSIS — Z7982 Long term (current) use of aspirin: Secondary | ICD-10-CM | POA: Insufficient documentation

## 2012-12-05 DIAGNOSIS — D649 Anemia, unspecified: Secondary | ICD-10-CM | POA: Insufficient documentation

## 2012-12-05 DIAGNOSIS — G8929 Other chronic pain: Secondary | ICD-10-CM | POA: Insufficient documentation

## 2012-12-05 DIAGNOSIS — M519 Unspecified thoracic, thoracolumbar and lumbosacral intervertebral disc disorder: Secondary | ICD-10-CM | POA: Insufficient documentation

## 2012-12-05 LAB — POCT I-STAT, CHEM 8
BUN: 17 mg/dL (ref 6–23)
Calcium, Ion: 1.21 mmol/L (ref 1.13–1.30)
Chloride: 104 mEq/L (ref 96–112)
HCT: 45 % (ref 39.0–52.0)
Hemoglobin: 15.3 g/dL (ref 13.0–17.0)
TCO2: 23 mmol/L (ref 0–100)

## 2012-12-05 LAB — CBC WITH DIFFERENTIAL/PLATELET
Basophils Absolute: 0 10*3/uL (ref 0.0–0.1)
Basophils Relative: 0 % (ref 0–1)
Eosinophils Absolute: 0 10*3/uL (ref 0.0–0.7)
Eosinophils Relative: 0 % (ref 0–5)
HCT: 39.4 % (ref 39.0–52.0)
Hemoglobin: 13.4 g/dL (ref 13.0–17.0)
Lymphocytes Relative: 14 % (ref 12–46)
MCH: 28.9 pg (ref 26.0–34.0)
MCHC: 34 g/dL (ref 30.0–36.0)
MCV: 84.9 fL (ref 78.0–100.0)
Monocytes Absolute: 0.3 10*3/uL (ref 0.1–1.0)
Monocytes Relative: 3 % (ref 3–12)
Platelets: 153 10*3/uL (ref 150–400)
RDW: 13.5 % (ref 11.5–15.5)
WBC: 8.4 10*3/uL (ref 4.0–10.5)

## 2012-12-05 LAB — URINE MICROSCOPIC-ADD ON

## 2012-12-05 LAB — URINALYSIS, ROUTINE W REFLEX MICROSCOPIC
Glucose, UA: 250 mg/dL — AB
Ketones, ur: 40 mg/dL — AB
Protein, ur: NEGATIVE mg/dL
Urobilinogen, UA: 0.2 mg/dL (ref 0.0–1.0)

## 2012-12-05 LAB — OCCULT BLOOD, POC DEVICE: Fecal Occult Bld: NEGATIVE

## 2012-12-05 MED ORDER — CIPROFLOXACIN HCL 500 MG PO TABS: 500.0000 mg | ORAL_TABLET | Freq: Two times a day (BID) | ORAL | Status: DC

## 2012-12-05 MED ORDER — HYDROCODONE-ACETAMINOPHEN 5-325 MG PO TABS
1.0000 | ORAL_TABLET | Freq: Once | ORAL | Status: AC
  Administered 2012-12-05: 1 via ORAL
  Filled 2012-12-05: qty 1

## 2012-12-05 MED ORDER — CIPROFLOXACIN HCL 500 MG PO TABS
500.0000 mg | ORAL_TABLET | Freq: Once | ORAL | Status: AC
  Administered 2012-12-05: 500 mg via ORAL
  Filled 2012-12-05: qty 1

## 2012-12-05 MED ORDER — HYDROCODONE-ACETAMINOPHEN 5-325 MG PO TABS: 1.0000 | ORAL_TABLET | Freq: Four times a day (QID) | ORAL | Status: DC | PRN

## 2012-12-05 NOTE — ED Notes (Signed)
Assisted NP with rectal exam. Pt tolerated well.

## 2012-12-05 NOTE — ED Provider Notes (Signed)
CSN: [id number]     Arrival date & time 12/05/12  0302 History   First MD Initiated Contact with Patient 12/05/12 0319     Chief Complaint  Patient presents with  . Back Pain  . Abdominal Pain   (Consider location/radiation/quality/duration/timing/severity/associated sxs/prior Treatment) HPI Comments: Patient presents tonight with low abdominal pain, dysuria.  That started this evening.  Denies any fever, constipation, difficulty urinating.  He does, state, that he's had 2 bowel movements since the pain started.  He has a history of chronic low back pain.  GERD.  Enlarged prostate prostatitis anemia, degenerative disc disease, and blindness.  Patient is a 67 y.o. male presenting with back pain and abdominal pain. The history is provided by the patient.  Back Pain Location:  Lumbar spine Quality:  Unable to specify Radiates to:  Does not radiate Pain severity:  Mild Onset quality:  Sudden Duration: Chronic low back pain. Associated symptoms: abdominal pain   Associated symptoms: no dysuria, no fever and no weakness   Abdominal Pain Associated symptoms: no chills, no dysuria, no fever, no nausea and no vomiting     Past Medical History  Diagnosis Date  . Lumbar disc disease 02/06/2011  . 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 Allergic rhinitis, cause unspecified 02/08/2011  . GERD (gastroesophageal reflux disease) 02/08/2011  . Elevated PSA 02/08/2011  . BPH (benign prostatic hypertrophy) 02/08/2011  . 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 History of prostatitis 04/11/2011  . 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 Anemia, unspecified 04/11/2011   Past Surgical History  Procedure Laterality Date  . Back surgery    . Cyst excision     Family History  Problem Relation Age of Onset  . Blindness Other   . Heart disease Other   . Hypertension Other   . Diabetes Other   . Mental illness Other   . Sudden death Other    History  Substance Use Topics  .  Smoking status: Never Smoker   . Smokeless tobacco: Not on file  . Alcohol Use: No    Review of Systems  Constitutional: Negative for fever and chills.  Gastrointestinal: Positive for abdominal pain. Negative for nausea and vomiting.  Genitourinary: Positive for flank pain. Negative for dysuria, frequency and decreased urine volume.  Musculoskeletal: Positive for back pain.  Neurological: Negative for dizziness and weakness.  All other systems reviewed and are negative.    Allergies  Review of patient's allergies indicates no known allergies.  Home Medications   Current Outpatient Rx  Name  Route  Sig  Dispense  Refill  . aspirin 81 MG EC tablet   Oral   Take 1 tablet (81 mg total) by mouth daily. Swallow whole.   30 tablet   12   . CRANBERRY PO   Oral   Take 2 capsules by mouth daily.         . lansoprazole (PREVACID) 30 MG capsule   Oral   Take 1 capsule (30 mg total) by mouth daily as needed.   90 capsule   3   . Multiple Vitamins-Minerals (MULTIVITAMIN WITH MINERALS) tablet   Oral   Take 1 tablet by mouth daily.         . ciprofloxacin (CIPRO) 500 MG tablet   Oral   Take 1 tablet (500 mg total) by mouth 2 (two) times daily.   28 tablet   0   . HYDROcodone-acetaminophen (NORCO/VICODIN) 5-325 MG per tablet  Oral   Take 1 tablet by mouth every 6 (six) hours as needed for pain.   20 tablet   0    BP 125/83  Pulse 78  Temp(Src) 97.7 F (36.5 C) (Oral)  Resp 24  Ht 5' 9.5" (1.765 m)  Wt 175 lb (79.379 kg)  BMI 25.48 kg/m2  SpO2 100% Physical Exam  Nursing note and vitals reviewed. Constitutional: He is oriented to person, place, and time. He appears well-developed and well-nourished.  HENT:  Head: Normocephalic.  Neck: Normal range of motion.  Cardiovascular: Normal rate and regular rhythm.   Pulmonary/Chest: Effort normal and breath sounds normal.  Abdominal: Soft. Bowel sounds are normal. He exhibits no distension. There is tenderness in  the suprapubic area.  Genitourinary: Rectum normal. Rectal exam shows no external hemorrhoid, no internal hemorrhoid, no fissure, no tenderness and anal tone normal. Guaiac negative stool. Prostate is enlarged.  Musculoskeletal: Normal range of motion.  Neurological: He is alert and oriented to person, place, and time.  Skin: Skin is warm and dry.    ED Course  Procedures (including critical care time) Labs Review Labs Reviewed  URINALYSIS, ROUTINE W REFLEX MICROSCOPIC - Abnormal; Notable for the following:    APPearance CLOUDY (*)    Glucose, UA 250 (*)    Hgb urine dipstick LARGE (*)    Ketones, ur 40 (*)    All other components within normal limits  CBC WITH DIFFERENTIAL - Abnormal; Notable for the following:    Neutrophils Relative % 82 (*)    All other components within normal limits  POCT I-STAT, CHEM 8 - Abnormal; Notable for the following:    Glucose, Bld 194 (*)    All other components within normal limits  URINE MICROSCOPIC-ADD ON  OCCULT BLOOD, POC DEVICE   Imaging Review No results found.  EKG Interpretation   None       MDM   1. Prostatitis, unspecified     After reviewing patient's lab work, urine rectal exam, which reveals that he has a large, firm prostate, discussion with Dr. Norlene Campbell,  decided to treat for prostatitis and have patient followup with his primary care physician   Arman Filter, NP 12/05/12 [phone]

## 2012-12-05 NOTE — ED Notes (Signed)
Patient presents to ED via [facility]. Per patient he started having lower back pain, flank pain and lower abdominal pain last night at approx 10pm. Pt c/o of burning/pain with urination. Pt also states that he has had "several loose stools" since the pain started- but denies any diarrhea. Also denies any N/V. Denies any heavy strain or lifting. A&Ox4 and ambulatory from EMS truck upon arrival to ED. BP 142/94. HR 76. Resp 16. Pain 8/10.

## 2012-12-05 NOTE — ED Provider Notes (Signed)
Medical screening examination/treatment/procedure(s) were performed by non-physician practitioner and as supervising physician I was immediately available for consultation/collaboration.    Nolton Denis M Chaden Doom, MD 12/05/12 0642 

## 2012-12-05 NOTE — ED Notes (Signed)
Patient informed that a urine sample is needed and provided patient with a urinal.

## 2013-02-20 ENCOUNTER — Ambulatory Visit (INDEPENDENT_AMBULATORY_CARE_PROVIDER_SITE_OTHER): Payer: 59 | Admitting: Physician Assistant

## 2013-02-20 VITALS — BP 112/82 | HR 100 | Temp 99.3°F | Resp 18 | Wt 181.0 lb

## 2013-02-20 DIAGNOSIS — R300 Dysuria: Secondary | ICD-10-CM

## 2013-02-20 DIAGNOSIS — B9789 Other viral agents as the cause of diseases classified elsewhere: Secondary | ICD-10-CM

## 2013-02-20 DIAGNOSIS — N419 Inflammatory disease of prostate, unspecified: Secondary | ICD-10-CM

## 2013-02-20 DIAGNOSIS — R82998 Other abnormal findings in urine: Secondary | ICD-10-CM

## 2013-02-20 DIAGNOSIS — J069 Acute upper respiratory infection, unspecified: Secondary | ICD-10-CM

## 2013-02-20 LAB — POCT CBC
Granulocyte percent: 71.6 %G (ref 37–80)
HCT, POC: 46.7 % (ref 43.5–53.7)
HEMOGLOBIN: 14.5 g/dL (ref 14.1–18.1)
LYMPH, POC: 1.6 (ref 0.6–3.4)
MCH, POC: 28.1 pg (ref 27–31.2)
MCHC: 31 g/dL — AB (ref 31.8–35.4)
MCV: 90.6 fL (ref 80–97)
MID (CBC): 0.7 (ref 0–0.9)
MPV: 9.3 fL (ref 0–99.8)
POC Granulocyte: 5.8 (ref 2–6.9)
POC LYMPH PERCENT: 19.7 %L (ref 10–50)
POC MID %: 8.7 % (ref 0–12)
Platelet Count, POC: 210 10*3/uL (ref 142–424)
RBC: 5.16 M/uL (ref 4.69–6.13)
RDW, POC: 14.3 %
WBC: 8.1 10*3/uL (ref 4.6–10.2)

## 2013-02-20 LAB — POCT UA - MICROSCOPIC ONLY
Casts, Ur, LPF, POC: NEGATIVE
Crystals, Ur, HPF, POC: NEGATIVE
Mucus, UA: POSITIVE
YEAST UA: NEGATIVE

## 2013-02-20 MED ORDER — DOXYCYCLINE HYCLATE 100 MG PO TABS: 100.0000 mg | ORAL_TABLET | Freq: Two times a day (BID) | ORAL | Status: DC

## 2013-02-20 MED ORDER — HYDROCOD POLST-CHLORPHEN POLST 10-8 MG/5ML PO LQCR: 5.0000 mL | Freq: Two times a day (BID) | ORAL | Status: AC

## 2013-02-20 NOTE — Progress Notes (Signed)
Subjective:    Patient ID: Vincent [name], male    DOB: 05-20-1945, 68 y.o.   MRN: [id number]  HPI Pt presents to clinic with 4 day h/o cold symptoms - his wife is sick with similar symptoms.  He has congestion with rhinorrhea - his worst complaint is the sore throat which has worsened since his symptoms started - he does not think that he has PND.  He also is worried because he has developed dysuria over the last several days and then today he started to have urinary frequency.  He has BPH and has had problems with prostatitis in the past.  OTC meds - mucinex DM Family sick contacts Flu vaccine - Oct  Review of Systems  Constitutional: Positive for fever and chills.  HENT: Positive for congestion, rhinorrhea and sore throat. Negative for postnasal drip.   Respiratory: Positive for cough.   Gastrointestinal: Positive for diarrhea.  Genitourinary: Positive for dysuria and frequency. Negative for testicular pain.  Musculoskeletal: Positive for myalgias.  Neurological: Positive for headaches.       Objective:   Physical Exam  Vitals reviewed. Constitutional: He is oriented to person, place, and time. He appears well-developed and well-nourished.  HENT:  Head: Normocephalic and atraumatic.  Right Ear: Hearing, tympanic membrane, external ear and ear canal normal.  Left Ear: Hearing, tympanic membrane, external ear and ear canal normal.  Nose: Nose normal.  Mouth/Throat: Uvula is midline, oropharynx is clear and moist and mucous membranes are normal.  Eyes: Conjunctivae are normal.  Neck: Normal range of motion.  Cardiovascular: Normal rate, regular rhythm and normal heart sounds.   No murmur heard. Pulmonary/Chest: Effort normal and breath sounds normal.  Genitourinary: Testes normal and penis normal. Uncircumcised. No penile erythema. No discharge found.  Lymphadenopathy:       Head (right side): No preauricular and no occipital adenopathy present.       Head (left  side): No preauricular and no occipital adenopathy present.    He has cervical adenopathy.       Right cervical: Superficial cervical adenopathy present.       Left cervical: Superficial cervical adenopathy present.       Right: No supraclavicular adenopathy present.       Left: No supraclavicular adenopathy present.  Neurological: He is alert and oriented to person, place, and time.  Skin: Skin is warm and dry.  Psychiatric: He has a normal mood and affect. His behavior is normal. Judgment and thought content normal.   Results for orders placed in visit on 02/20/13  POCT UA - MICROSCOPIC ONLY      Result Value Range   WBC, Ur, HPF, POC 2-8     RBC, urine, microscopic 4-7     Bacteria, U Microscopic 3+     Mucus, UA pos     Epithelial cells, urine per micros 0-4     Crystals, Ur, HPF, POC neg     Casts, Ur, LPF, POC neg     Yeast, UA neg    POCT CBC      Result Value Range   WBC 8.1  4.6 - 10.2 K/uL   Lymph, poc 1.6  0.6 - 3.4   POC LYMPH PERCENT 19.7  10 - 50 %L   MID (cbc) 0.7  0 - 0.9   POC MID % 8.7  0 - 12 %M   POC Granulocyte 5.8  2 - 6.9   Granulocyte percent 71.6  37 - 80 %G  RBC 5.16  4.69 - 6.13 M/uL   Hemoglobin 14.5  14.1 - 18.1 g/dL   HCT, POC 46.7  43.5 - 53.7 %   MCV 90.6  80 - 97 fL   MCH, POC 28.1  27 - 31.2 pg   MCHC 31.0 (*) 31.8 - 35.4 g/dL   RDW, POC 14.3     Platelet Count, POC 210  142 - 424 K/uL   MPV 9.3  0 - 99.8 fL       Assessment & Plan:  Dysuria - Plan: POCT UA - Microscopic Only, POCT CBC, POCT urinalysis dipstick  Viral URI with cough - Plan: POCT CBC, chlorpheniramine-HYDROcodone (TUSSIONEX PENNKINETIC ER) 10-8 MG/5ML LQCR  Urine white blood cells increased - Plan: Urine culture  Prostatitis - Plan: doxycycline (VIBRA-TABS) 100 MG tablet  We will treat him with Doxycycline which will cover his prostatitis and his possible bronchitis.  He will continue his Mucinex and we will treat his cough.  We have sent his urine out for  culture.  Windell Hummingbird PA-C 02/20/2013 9:12 PM

## 2013-02-20 NOTE — Patient Instructions (Signed)
Please push fluids.  Tylenol and Motrin for fever and body aches.    

## 2013-02-23 LAB — URINE CULTURE: Colony Count: 25000

## 2013-04-25 ENCOUNTER — Ambulatory Visit (INDEPENDENT_AMBULATORY_CARE_PROVIDER_SITE_OTHER): Payer: Medicare Other | Admitting: Physician Assistant

## 2013-04-25 ENCOUNTER — Encounter: Payer: Self-pay | Admitting: Physician Assistant

## 2013-04-25 ENCOUNTER — Other Ambulatory Visit (INDEPENDENT_AMBULATORY_CARE_PROVIDER_SITE_OTHER): Payer: Medicare Other

## 2013-04-25 VITALS — BP 138/90 | HR 68 | Temp 97.2°F | Ht 69.5 in | Wt 183.8 lb

## 2013-04-25 DIAGNOSIS — R61 Generalized hyperhidrosis: Secondary | ICD-10-CM

## 2013-04-25 LAB — BASIC METABOLIC PANEL
BUN: 13 mg/dL (ref 6–23)
CALCIUM: 9.5 mg/dL (ref 8.4–10.5)
CO2: 27 meq/L (ref 19–32)
CREATININE: 1 mg/dL (ref 0.4–1.5)
Chloride: 106 mEq/L (ref 96–112)
GFR: 93.62 mL/min (ref >60.00)
Glucose, Bld: 110 mg/dL — ABNORMAL HIGH (ref 70–99)
Potassium: 4.5 mEq/L (ref 3.5–5.1)
SODIUM: 138 meq/L (ref 135–145)

## 2013-04-25 LAB — HEPATIC FUNCTION PANEL
ALBUMIN: 3.9 g/dL (ref 3.5–5.2)
ALK PHOS: 76 U/L (ref 39–117)
ALT: 22 U/L (ref 0–53)
AST: 29 U/L (ref 0–37)
Bilirubin, Direct: 0 mg/dL (ref 0.0–0.3)
TOTAL PROTEIN: 7.7 g/dL (ref 6.0–8.3)
Total Bilirubin: 0.6 mg/dL (ref 0.3–1.2)

## 2013-04-25 LAB — CBC WITH DIFFERENTIAL/PLATELET
BASOS PCT: 0.4 % (ref 0.0–3.0)
Basophils Absolute: 0 10*3/uL (ref 0.0–0.1)
EOS PCT: 4.5 % (ref 0.0–5.0)
Eosinophils Absolute: 0.2 10*3/uL (ref 0.0–0.7)
HEMATOCRIT: 41.5 % (ref 39.0–52.0)
Hemoglobin: 13.2 g/dL (ref 13.0–17.0)
Lymphocytes Relative: 44.2 % (ref 12.0–46.0)
Lymphs Abs: 1.8 10*3/uL (ref 0.7–4.0)
MCHC: 31.8 g/dL (ref 30.0–36.0)
MCV: 87.4 fl (ref 78.0–100.0)
MONO ABS: 0.5 10*3/uL (ref 0.1–1.0)
MONOS PCT: 12.4 % — AB (ref 3.0–12.0)
NEUTROS ABS: 1.5 10*3/uL (ref 1.4–7.7)
Neutrophils Relative %: 38.5 % — ABNORMAL LOW (ref 43.0–77.0)
PLATELETS: 186 10*3/uL (ref 150.0–400.0)
RBC: 4.75 Mil/uL (ref 4.22–5.81)
RDW: 14.6 % (ref 11.5–14.6)
WBC: 4 10*3/uL — ABNORMAL LOW (ref 4.5–10.5)

## 2013-04-25 LAB — TSH: TSH: 0.82 u[IU]/mL (ref 0.35–5.50)

## 2013-04-25 NOTE — Progress Notes (Signed)
Pre visit review using our clinic review tool, if applicable. No additional management support is needed unless otherwise documented below in the visit note. 

## 2013-04-25 NOTE — Patient Instructions (Signed)
It was great meeting you today Mr. Khouri!  I have ordered labs for you today, please report to the lab in the lower level to have these completed.  If symptoms become much worse please follow up with Dr. Jenny Reichmann.

## 2013-04-25 NOTE — Progress Notes (Signed)
Subjective:    Patient ID: Vincent [name], male    DOB: 12/03/1945, 68 y.o.   MRN: [id number]  HPI Comments: Patient is a 68 year old male who presents to the office with complaint of night sweats. Reports they have been off and on over the last couple months and are starting to happen more often. Reports he notices it more on his trunk and upper extremities. States when he wakes up in the morning his sheets and bed clothes are wet. Denies N/V/F, diarrhea, constipation, lightheaded, dizziness, weight loss, travel outside of the country, SOB, cough. Patient is in a long term, monogamous relationship.      Review of Systems  Constitutional: Negative for fever, chills, activity change, appetite change and unexpected weight change.       Frequent night sweats  HENT: Negative for congestion and rhinorrhea.   Respiratory: Negative for cough and shortness of breath.   Cardiovascular: Negative for chest pain, palpitations and leg swelling.  Gastrointestinal: Negative for nausea, vomiting and diarrhea.  Musculoskeletal: Negative.   Neurological: Negative for dizziness, weakness, light-headedness and headaches.       Past Medical History  Diagnosis Date  . Lumbar disc disease 02/06/2011  . 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 Allergic rhinitis, cause unspecified 02/08/2011  . GERD (gastroesophageal reflux disease) 02/08/2011  . Elevated PSA 02/08/2011  . BPH (benign prostatic hypertrophy) 02/08/2011  . 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 History of prostatitis 04/11/2011  . 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 Anemia, unspecified 04/11/2011    Objective:   Physical Exam  Vitals reviewed. Constitutional: He is oriented to person, place, and time. He appears well-developed and well-nourished. No distress.  HENT:  Head: Normocephalic and atraumatic.  Right Ear: External ear normal.  Left Ear: External ear normal.  Nose: Nose normal.  Mouth/Throat:  Oropharynx is clear and moist. No oropharyngeal exudate.  Eyes:  Patient is blind  Neck: Normal range of motion. No tracheal deviation present. No thyromegaly present.  Cardiovascular: Normal rate, regular rhythm and intact distal pulses.  Exam reveals no gallop and no friction rub.   No murmur heard. Pulmonary/Chest: Effort normal and breath sounds normal. He has no wheezes. He has no rales.  Abdominal: Soft. There is no tenderness.  Musculoskeletal: Normal range of motion.  Lymphadenopathy:    He has no cervical adenopathy.       Right: No supraclavicular adenopathy present.       Left: No supraclavicular adenopathy present.  Neurological: He is alert and oriented to person, place, and time.  Skin: Skin is warm and dry.  Psychiatric: He has a normal mood and affect.       Lab Results  Component Value Date   WBC 8.1 02/20/2013   HGB 14.5 02/20/2013   HCT 46.7 02/20/2013   PLT 153 12/05/2012   GLUCOSE 194* 12/05/2012   CHOL 183 08/01/2012   TRIG 125.0 08/01/2012   HDL 44.30 08/01/2012   LDLCALC 114* 08/01/2012   ALT 16 08/01/2012   AST 19 08/01/2012   NA 139 12/05/2012   K 4.5 12/05/2012   CL 104 12/05/2012   CREATININE 1.20 12/05/2012   BUN 17 12/05/2012   CO2 27 08/01/2012   TSH 1.58 08/01/2012   PSA 12.48* 08/01/2012   Filed Vitals:   04/25/13 0827  BP: 138/90  Pulse: 68  Temp: 97.2 F (36.2 C)   Wt Readings from Last 3 Encounters:  04/25/13 183 lb 12.8  oz (83.371 kg)  02/20/13 181 lb (82.101 kg)  12/05/12 175 lb (79.379 kg)    Assessment & Plan:    Night sweats: Labs ordered today for CBC, BMET, HFT and TSH, will review when resulted. Patient instructed to monitor when it happens and frequency.  RTO if symptoms become worse.

## 2013-08-02 ENCOUNTER — Encounter: Payer: 59 | Admitting: Internal Medicine

## 2013-09-05 ENCOUNTER — Encounter: Payer: Self-pay | Admitting: Internal Medicine

## 2013-09-05 ENCOUNTER — Ambulatory Visit (INDEPENDENT_AMBULATORY_CARE_PROVIDER_SITE_OTHER): Payer: Medicare Other | Admitting: Internal Medicine

## 2013-09-05 VITALS — BP 132/80 | HR 57 | Temp 97.9°F | Wt 184.0 lb

## 2013-09-05 DIAGNOSIS — Z136 Encounter for screening for cardiovascular disorders: Secondary | ICD-10-CM

## 2013-09-05 DIAGNOSIS — Z0000 Encounter for general adult medical examination without abnormal findings: Secondary | ICD-10-CM

## 2013-09-05 DIAGNOSIS — M542 Cervicalgia: Secondary | ICD-10-CM

## 2013-09-05 MED ORDER — IBUPROFEN 800 MG PO TABS: ORAL_TABLET | ORAL | Status: DC

## 2013-09-05 MED ORDER — TIZANIDINE HCL 4 MG PO TABS: 4.0000 mg | ORAL_TABLET | Freq: Four times a day (QID) | ORAL | Status: DC | PRN

## 2013-09-05 NOTE — Patient Instructions (Signed)
Please take all new medication as prescribed - the ibuprofen, and muscle relaxer for the neck as needed  Please continue all other medications as before, and refills have been done if requested.  Please have the pharmacy call with any other refills you may need.  Please continue your efforts at being more active, low cholesterol diet, and weight control.  You are otherwise up to date with prevention measures today.  Please keep your appointments with your specialists as you may have planned - Dr Janice Norrie, for the PSA  I think we can hold on more blood work today  Please return in 1 year for your yearly visit, or sooner if needed, with Lab testing done 3-5 days before

## 2013-09-05 NOTE — Progress Notes (Signed)
Subjective:    Patient ID: Vincent [name], male    DOB: 1945/10/12, 68 y.o.   MRN: [id number]  HPI  Here for wellness and f/u;  Overall doing ok;  Pt denies CP, worsening SOB, DOE, wheezing, orthopnea, PND, worsening LE edema, palpitations, dizziness or syncope.  Pt denies neurological change such as new headache, facial or extremity weakness.  Pt denies polydipsia, polyuria, or low sugar symptoms. Pt states overall good compliance with treatment and medications, good tolerability, and has been trying to follow lower cholesterol diet.  Pt denies worsening depressive symptoms, suicidal ideation or panic. No fever, night sweats, wt loss, loss of appetite, or other constitutional symptoms.  Pt states good ability with ADL's, has low fall risk, home safety reviewed and adequate, no other significant changes in hearing or vision, and only occasionally active with exercise. Declines labs today, done in mar 2015, Sees DR nesi/urology with PSA. No complaints except for several days left post neck aching, mild to mod, worse to turn head to left, no radicular symtoms, Pt denies new neurological symptoms such as new headache, or facial or extremity weakness or numbness Past Medical History  Diagnosis Date  . Lumbar disc disease 02/06/2011  . 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 Allergic rhinitis, cause unspecified 02/08/2011  . GERD (gastroesophageal reflux disease) 02/08/2011  . Elevated PSA 02/08/2011  . BPH (benign prostatic hypertrophy) 02/08/2011  . 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 History of prostatitis 04/11/2011  . 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 Anemia, unspecified 04/11/2011   Past Surgical History  Procedure Laterality Date  . Back surgery    . Cyst excision      reports that he has never smoked. He does not have any smokeless tobacco history on file. He reports that he does not drink alcohol or use illicit drugs. family history includes Blindness in  his other; Diabetes in his other; Heart disease in his other; Hypertension in his other; Mental illness in his other; Sudden death in his other. No Known Allergies Current Outpatient Prescriptions on File Prior to Visit  Medication Sig Dispense Refill  . aspirin 81 MG EC tablet Take 1 tablet (81 mg total) by mouth daily. Swallow whole.  30 tablet  12  . CRANBERRY PO Take 2 capsules by mouth daily.      . Multiple Vitamins-Minerals (MULTIVITAMIN WITH MINERALS) tablet Take 1 tablet by mouth daily.       No current facility-administered medications on file prior to visit.   Review of Systems Constitutional: Negative for increased diaphoresis, other activity, appetite or other siginficant weight change  HENT: Negative for worsening hearing loss, ear pain, facial swelling, mouth sores and neck stiffness.   Eyes: Negative for other worsening pain, redness or visual disturbance.  Respiratory: Negative for shortness of breath and wheezing.   Cardiovascular: Negative for chest pain and palpitations.  Gastrointestinal: Negative for diarrhea, blood in stool, abdominal distention or other pain Genitourinary: Negative for hematuria, flank pain or change in urine volume.  Musculoskeletal: Negative for myalgias or other joint complaints.  Skin: Negative for color change and wound.  Neurological: Negative for syncope and numbness. other than noted Hematological: Negative for adenopathy. or other swelling Psychiatric/Behavioral: Negative for hallucinations, self-injury, decreased concentration or other worsening agitation.      Objective:   Physical Exam BP 132/80  Pulse 57  Temp(Src) 97.9 F (36.6 C) (Oral)  Wt 184 lb (83.462 kg)  SpO2 97% VS noted,  Constitutional: Pt is oriented to person, place, and time. Appears well-developed and well-nourished.  Head: Normocephalic and atraumatic.  Right Ear: External ear normal.  Left Ear: External ear normal.  Nose: Nose normal.  Mouth/Throat:  Oropharynx is clear and moist.  Eyes: Conjunctivae and EOM are normal. Pupils are equal, round, and reactive to light.  Neck: Normal range of motion. Neck supple. No JVD present. No tracheal deviation present.  Cardiovascular: Normal rate, regular rhythm, normal heart sounds and intact distal pulses.   Pulmonary/Chest: Effort normal and breath sounds without rales or wheezing  Abdominal: Soft. Bowel sounds are normal. NT. No HSM  Musculoskeletal: Normal range of motion. Exhibits no edema.  Lymphadenopathy:  Has no cervical adenopathy.  Neurological: Pt is alert and oriented to person, place, and time. Pt has normal reflexes. No cranial nerve deficit. Motor grossly intact Skin: Skin is warm and dry. No rash noted.  Psychiatric:  Has normal mood and affect. Behavior is normal.  Has mild tender left postlateral neck only    Assessment & Plan:

## 2013-09-05 NOTE — Assessment & Plan Note (Signed)

## 2013-09-05 NOTE — Progress Notes (Signed)
Pre visit review using our clinic review tool, if applicable. No additional management support is needed unless otherwise documented below in the visit note. 

## 2013-09-08 DIAGNOSIS — M542 Cervicalgia: Secondary | ICD-10-CM | POA: Insufficient documentation

## 2013-09-08 NOTE — Assessment & Plan Note (Signed)
C/w msk strain, for ibuprofen, muscle relaxer prn,  to f/u any worsening symptoms or concerns

## 2014-04-12 ENCOUNTER — Telehealth: Payer: Self-pay

## 2014-04-15 NOTE — Telephone Encounter (Signed)
LVM for patient to call the practice for confirmation of flu shot or can call the office for an apt to receive a flu vaccine before 05/02/2014.

## 2014-09-10 ENCOUNTER — Ambulatory Visit (INDEPENDENT_AMBULATORY_CARE_PROVIDER_SITE_OTHER): Payer: Medicare Other | Admitting: Internal Medicine

## 2014-09-10 ENCOUNTER — Encounter: Payer: Self-pay | Admitting: Internal Medicine

## 2014-09-10 ENCOUNTER — Other Ambulatory Visit (INDEPENDENT_AMBULATORY_CARE_PROVIDER_SITE_OTHER): Payer: Medicare Other

## 2014-09-10 VITALS — BP 114/82 | HR 68 | Temp 97.8°F | Ht 70.0 in | Wt 182.0 lb

## 2014-09-10 DIAGNOSIS — R972 Elevated prostate specific antigen [PSA]: Secondary | ICD-10-CM | POA: Diagnosis not present

## 2014-09-10 DIAGNOSIS — Z23 Encounter for immunization: Secondary | ICD-10-CM

## 2014-09-10 DIAGNOSIS — E119 Type 2 diabetes mellitus without complications: Secondary | ICD-10-CM | POA: Insufficient documentation

## 2014-09-10 DIAGNOSIS — D649 Anemia, unspecified: Secondary | ICD-10-CM

## 2014-09-10 DIAGNOSIS — Z0000 Encounter for general adult medical examination without abnormal findings: Secondary | ICD-10-CM

## 2014-09-10 DIAGNOSIS — R739 Hyperglycemia, unspecified: Secondary | ICD-10-CM

## 2014-09-10 LAB — LIPID PANEL
Cholesterol: 199 mg/dL (ref 0–200)
HDL: 45.3 mg/dL (ref >39.00)
LDL Cholesterol: 137 mg/dL — ABNORMAL HIGH (ref 0–99)
NONHDL: 153.58
Total CHOL/HDL Ratio: 4
Triglycerides: 85 mg/dL (ref 0.0–149.0)
VLDL: 17 mg/dL (ref 0.0–40.0)

## 2014-09-10 LAB — HEPATIC FUNCTION PANEL
ALT: 21 U/L (ref 0–53)
AST: 24 U/L (ref 0–37)
Albumin: 4.4 g/dL (ref 3.5–5.2)
Alkaline Phosphatase: 82 U/L (ref 39–117)
BILIRUBIN DIRECT: 0.1 mg/dL (ref 0.0–0.3)
Total Bilirubin: 0.5 mg/dL (ref 0.2–1.2)
Total Protein: 7.9 g/dL (ref 6.0–8.3)

## 2014-09-10 LAB — BASIC METABOLIC PANEL
BUN: 18 mg/dL (ref 6–23)
CALCIUM: 10 mg/dL (ref 8.4–10.5)
CHLORIDE: 104 meq/L (ref 96–112)
CO2: 31 mEq/L (ref 19–32)
CREATININE: 1.03 mg/dL (ref 0.40–1.50)
GFR: 92.19 mL/min (ref >60.00)
GLUCOSE: 104 mg/dL — AB (ref 70–99)
Potassium: 4.5 mEq/L (ref 3.5–5.1)
Sodium: 139 mEq/L (ref 135–145)

## 2014-09-10 LAB — URINALYSIS, ROUTINE W REFLEX MICROSCOPIC
Bilirubin Urine: NEGATIVE
HGB URINE DIPSTICK: NEGATIVE
Ketones, ur: NEGATIVE
Leukocytes, UA: NEGATIVE
Nitrite: NEGATIVE
RBC / HPF: NONE SEEN (ref 0–?)
Specific Gravity, Urine: 1.025 (ref 1.000–1.030)
TOTAL PROTEIN, URINE-UPE24: NEGATIVE
UROBILINOGEN UA: 0.2 (ref 0.0–1.0)
Urine Glucose: NEGATIVE
WBC UA: NONE SEEN (ref 0–?)
pH: 6 (ref 5.0–8.0)

## 2014-09-10 LAB — CBC WITH DIFFERENTIAL/PLATELET
BASOS PCT: 0.4 % (ref 0.0–3.0)
Basophils Absolute: 0 10*3/uL (ref 0.0–0.1)
EOS PCT: 4.8 % (ref 0.0–5.0)
Eosinophils Absolute: 0.2 10*3/uL (ref 0.0–0.7)
HCT: 43.5 % (ref 39.0–52.0)
Hemoglobin: 14 g/dL (ref 13.0–17.0)
LYMPHS ABS: 1.5 10*3/uL (ref 0.7–4.0)
Lymphocytes Relative: 37.6 % (ref 12.0–46.0)
MCHC: 32.3 g/dL (ref 30.0–36.0)
MCV: 86.7 fl (ref 78.0–100.0)
MONOS PCT: 9.9 % (ref 3.0–12.0)
Monocytes Absolute: 0.4 10*3/uL (ref 0.1–1.0)
NEUTROS ABS: 1.9 10*3/uL (ref 1.4–7.7)
NEUTROS PCT: 47.3 % (ref 43.0–77.0)
PLATELETS: 197 10*3/uL (ref 150.0–400.0)
RBC: 5.02 Mil/uL (ref 4.22–5.81)
RDW: 14.3 % (ref 11.5–15.5)
WBC: 4 10*3/uL (ref 4.0–10.5)

## 2014-09-10 LAB — TSH: TSH: 0.77 u[IU]/mL (ref 0.35–4.50)

## 2014-09-10 LAB — PSA: PSA: 14.74 ng/mL — ABNORMAL HIGH (ref 0.10–4.00)

## 2014-09-10 LAB — HEMOGLOBIN A1C: Hgb A1c MFr Bld: 6.8 % — ABNORMAL HIGH (ref 4.6–6.5)

## 2014-09-10 MED ORDER — IBUPROFEN 800 MG PO TABS: ORAL_TABLET | ORAL | Status: DC

## 2014-09-10 NOTE — Progress Notes (Signed)
Pre visit review using our clinic review tool, if applicable. No additional management support is needed unless otherwise documented below in the visit note. 

## 2014-09-10 NOTE — Progress Notes (Signed)
Subjective:    Patient ID: Vincent [name], male    DOB: 06-Feb-1945, 69 y.o.   MRN: [id number]  HPI  Here for wellness and f/u;  Overall doing ok;  Pt denies Chest pain, worsening SOB, DOE, wheezing, orthopnea, PND, worsening LE edema, palpitations, dizziness or syncope.  Pt denies neurological change such as new headache, facial or extremity weakness.  Pt denies polydipsia, polyuria, or low sugar symptoms. Pt states overall good compliance with treatment and medications, good tolerability, and has been trying to follow appropriate diet.  Pt denies worsening depressive symptoms, suicidal ideation or panic. No fever, night sweats, wt loss, loss of appetite, or other constitutional symptoms.  Pt states good ability with ADL's, has low fall risk, home safety reviewed and adequate, no other significant changes in hearing or vision, and occasionally active with exercise with walking on treadmill several days per wk for 30 min  Also has occas wakening in the AM with right sciatica after lying flat for several hours, always improved with 30 min Wt Readings from Last 3 Encounters:  09/10/14 182 lb (82.555 kg)  09/05/13 184 lb (83.462 kg)  04/25/13 183 lb 12.8 oz (83.371 kg)   Past Medical History  Diagnosis Date  . Lumbar disc disease 02/06/2011  . 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 Allergic rhinitis, cause unspecified 02/08/2011  . GERD (gastroesophageal reflux disease) 02/08/2011  . Elevated PSA 02/08/2011  . BPH (benign prostatic hypertrophy) 02/08/2011  . 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 History of prostatitis 04/11/2011  . 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 Anemia, unspecified 04/11/2011   Past Surgical History  Procedure Laterality Date  . Back surgery    . Cyst excision      reports that he has never smoked. He does not have any smokeless tobacco history on file. He reports that he does not drink alcohol or use illicit drugs. family history includes  Blindness in his other; Diabetes in his other; Heart disease in his other; Hypertension in his other; Mental illness in his other; Sudden death in his other. No Known Allergies Current Outpatient Prescriptions on File Prior to Visit  Medication Sig Dispense Refill  . aspirin 81 MG EC tablet Take 1 tablet (81 mg total) by mouth daily. Swallow whole. 30 tablet 12  . CRANBERRY PO Take 2 capsules by mouth daily.    . Multiple Vitamins-Minerals (MULTIVITAMIN WITH MINERALS) tablet Take 1 tablet by mouth daily.    Marland Kitchen tiZANidine (ZANAFLEX) 4 MG tablet Take 1 tablet (4 mg total) by mouth every 6 (six) hours as needed for muscle spasms. (Patient not taking: Reported on 09/10/2014) 40 tablet 1   No current facility-administered medications on file prior to visit.     Review of Systems Constitutional: Negative for increased diaphoresis, other activity, appetite or siginficant weight change other than noted HENT: Negative for worsening hearing loss, ear pain, facial swelling, mouth sores and neck stiffness.   Eyes: Negative for other worsening pain, redness or visual disturbance.  Respiratory: Negative for shortness of breath and wheezing  Cardiovascular: Negative for chest pain and palpitations.  Gastrointestinal: Negative for diarrhea, blood in stool, abdominal distention or other pain Genitourinary: Negative for hematuria, flank pain or change in urine volume.  Musculoskeletal: Negative for myalgias or other joint complaints.  Skin: Negative for color change and wound or drainage.  Neurological: Negative for syncope and numbness. other than noted Hematological: Negative for adenopathy. or other swelling Psychiatric/Behavioral: Negative for hallucinations,  SI, self-injury, decreased concentration or other worsening agitation.      Objective:   Physical Exam BP 114/82 mmHg  Pulse 68  Temp(Src) 97.8 F (36.6 C) (Oral)  Ht 5\' 10"  (1.778 m)  Wt 182 lb (82.555 kg)  BMI 26.11 kg/m2  SpO2 98% VS  noted,  Constitutional: Pt is oriented to person, place, and time. Appears well-developed and well-nourished, in no significant distress Head: Normocephalic and atraumatic.  Right Ear: External ear normal.  Left Ear: External ear normal.  Nose: Nose normal.  Mouth/Throat: Oropharynx is clear and moist.  Eyes: Conjunctivae and EOM are normal. Pupils are equal, round, and reactive to light.  Neck: Normal range of motion. Neck supple. No JVD present. No tracheal deviation present or significant neck LA or mass Cardiovascular: Normal rate, regular rhythm, normal heart sounds and intact distal pulses.   Pulmonary/Chest: Effort normal and breath sounds without rales or wheezing  Abdominal: Soft. Bowel sounds are normal. NT. No HSM  Musculoskeletal: Normal range of motion. Exhibits no edema.  Lymphadenopathy:  Has no cervical adenopathy.  Neurological: Pt is alert and oriented to person, place, and time. Pt has normal reflexes. No cranial nerve deficit. Motor grossly intact Skin: Skin is warm and dry. No rash noted.  Psychiatric:  Has normal mood and affect. Behavior is normal.  Lab Results  Component Value Date   WBC 4.0* 04/25/2013   HGB 13.2 04/25/2013   HCT 41.5 04/25/2013   PLT 186.0 04/25/2013   GLUCOSE 110* 04/25/2013   CHOL 183 08/01/2012   TRIG 125.0 08/01/2012   HDL 44.30 08/01/2012   LDLCALC 114* 08/01/2012   ALT 22 04/25/2013   AST 29 04/25/2013   NA 138 04/25/2013   K 4.5 04/25/2013   CL 106 04/25/2013   CREATININE 1.0 04/25/2013   BUN 13 04/25/2013   CO2 27 04/25/2013   TSH 0.82 04/25/2013   PSA 12.48* 08/01/2012       Assessment & Plan:

## 2014-09-10 NOTE — Assessment & Plan Note (Signed)
Labs to include PSA with forwarding to urology/Dr [facility]

## 2014-09-10 NOTE — Patient Instructions (Addendum)
You had the Pneumovax shot today  Please continue all other medications as before, and refills have been done if requested.  Please have the pharmacy call with any other refills you may need.  Please continue your efforts at being more active, low cholesterol diet, and weight control.  You are otherwise up to date with prevention measures today.  Please keep your appointments with your specialists as you may have planned  Please go to the LAB in the Basement (turn left off the elevator) for the tests to be done today  You will be contacted by phone if any changes need to be made immediately.  Otherwise, you will receive a letter about your results with an explanation, but please check with MyChart first.  Please remember to sign up for MyChart if you have not done so, as this will be important to you in the future with finding out test results, communicating by private email, and scheduling acute appointments online when needed.  Please return in 1 year for your yearly visit, or sooner if needed, with Lab testing done 3-5 days before  

## 2014-09-10 NOTE — Assessment & Plan Note (Signed)
Minor, to also add [id number]

## 2014-09-10 NOTE — Assessment & Plan Note (Signed)

## 2014-09-10 NOTE — Addendum Note (Signed)
Addended by: Lyman Bishop on: 09/10/2014 01:57 PM   Modules accepted: Orders

## 2014-12-21 ENCOUNTER — Other Ambulatory Visit: Payer: Self-pay | Admitting: Internal Medicine

## 2015-01-27 ENCOUNTER — Ambulatory Visit (INDEPENDENT_AMBULATORY_CARE_PROVIDER_SITE_OTHER): Payer: Medicare Other | Admitting: Family Medicine

## 2015-01-27 VITALS — BP 118/80 | HR 91 | Temp 99.2°F | Resp 18 | Ht 69.0 in | Wt 186.0 lb

## 2015-01-27 DIAGNOSIS — R300 Dysuria: Secondary | ICD-10-CM

## 2015-01-27 DIAGNOSIS — N390 Urinary tract infection, site not specified: Secondary | ICD-10-CM

## 2015-01-27 DIAGNOSIS — R8281 Pyuria: Secondary | ICD-10-CM

## 2015-01-27 LAB — POCT URINALYSIS DIP (MANUAL ENTRY)
Bilirubin, UA: NEGATIVE
Glucose, UA: 100 — AB
Nitrite, UA: POSITIVE — AB
Protein Ur, POC: 100 — AB
Spec Grav, UA: 1.02
Urobilinogen, UA: 1
pH, UA: 5

## 2015-01-27 LAB — POC MICROSCOPIC URINALYSIS (UMFC): Mucus: ABSENT

## 2015-01-27 MED ORDER — CIPROFLOXACIN HCL 500 MG PO TABS: 500.0000 mg | ORAL_TABLET | Freq: Two times a day (BID) | ORAL | Status: DC

## 2015-01-27 NOTE — Patient Instructions (Signed)

## 2015-01-27 NOTE — Progress Notes (Signed)
@[email or web address] @  By signing my name below, I, Vincent [name], attest that this documentation has been prepared under the direction and in the presence of Vincent Haber, MD.  Electronically Signed: [name], ED Scribe. 01/27/2015. 3:18 PM.  Patient ID: Vincent [name] MRN: [id number], DOB: 04/25/1945, 69 y.o. Date of Encounter: 01/27/2015, 3:17 PM  Primary Physician: [name], MD  Chief Complaint:  Chief Complaint  Patient presents with  . Urinary Tract Infection    Frequency, Painful urination x 2 days    HPI: 69 y.o. year old male with history below presents with dysuria that began 2 days ago. Pt reports associated frequency, urine odor, fever and body aches. He thinks he may have a UTI. He denies nausea and emesis.    Pt is blind and works at the [employer].   Past Medical History  Diagnosis Date  . Lumbar disc disease 02/06/2011  . 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 Allergic rhinitis, cause unspecified 02/08/2011  . GERD (gastroesophageal reflux disease) 02/08/2011  . Elevated PSA 02/08/2011  . BPH (benign prostatic hypertrophy) 02/08/2011  . 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 History of prostatitis 04/11/2011  . 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 Anemia, unspecified 04/11/2011     Home Meds: Prior to Admission medications   Medication Sig Start Date End Date Taking? Authorizing Provider  aspirin 81 MG EC tablet Take 1 tablet (81 mg total) by mouth daily. Swallow whole. 08/01/12  Yes Biagio Borg, MD  CRANBERRY PO Take 2 capsules by mouth daily.   Yes Historical Provider, MD  ibuprofen (ADVIL,MOTRIN) 800 MG tablet Take 1 tablet (800 mg total) by mouth every 8 (eight) hours as needed. for pain 12/23/14  Yes Biagio Borg, MD  lansoprazole (PREVACID) 15 MG capsule Take 15 mg by mouth daily at 12 noon.   Yes Historical Provider, MD  Multiple Vitamins-Minerals (MULTIVITAMIN WITH MINERALS) tablet Take 1 tablet by mouth daily.   Yes  Historical Provider, MD  tamsulosin (FLOMAX) 0.4 MG CAPS capsule TK ONE C PO  HS 07/12/14  Yes Historical Provider, MD    Allergies: No Known Allergies  Social History   Social History  . Marital Status: Married    Spouse Name: N/A  . Number of Children: N/A  . Years of Education: N/A   Occupational History  . Not on file.   Social History Main Topics  . Smoking status: Never Smoker   . Smokeless tobacco: Not on file  . Alcohol Use: No  . Drug Use: No  . Sexual Activity: Not on file   Other Topics Concern  . Not on file   Social History Narrative     Review of Systems: Constitutional: negative for chills, night sweats, weight changes, or fatigue  HEENT: negative for vision changes, hearing loss, congestion, rhinorrhea, ST, epistaxis, or sinus pressure Cardiovascular: negative for chest pain or palpitations Respiratory: negative for hemoptysis, wheezing, shortness of breath, or cough Abdominal: negative for abdominal pain, nausea, vomiting, diarrhea, or constipation Dermatological: negative for rash Neurologic: negative for headache, dizziness, or syncope All other systems reviewed and are otherwise negative with the exception to those above and in the HPI.   Physical Exam: Blood pressure 118/80, pulse 91, temperature 99.2 F (37.3 C), temperature source Oral, resp. rate 18, height 5\' 9"  (1.753 m), weight 186 lb (84.369 kg), SpO2 94 %., Body mass index is 27.45 kg/(m^2). General: Well developed, well nourished, in no acute distress. Head:  Normocephalic, atraumatic, eyes without discharge, sclera non-icteric, nares are without discharge. Bilateral auditory canals clear, TM's are without perforation, pearly grey and translucent with reflective cone of light bilaterally. Oral cavity moist, posterior pharynx without exudate, erythema, peritonsillar abscess, or post nasal drip.  Neck: Supple. No thyromegaly. Full ROM. No lymphadenopathy. Lungs: Clear bilaterally to  auscultation without wheezes, rales, or rhonchi. Breathing is unlabored. Heart: RRR with S1 S2. No murmurs, rubs, or gallops appreciated. Abdomen: Soft, non-tender, non-distended with normoactive bowel sounds. No hepatomegaly. No rebound/guarding. No obvious abdominal masses. Msk:  Strength and tone normal for age. Extremities/Skin: Warm and dry. No clubbing or cyanosis. No edema. No rashes or suspicious lesions. Neuro: Alert and oriented X 3. Moves all extremities spontaneously. Gait is normal. CNII-XII grossly in tact. Psych:  Responds to questions appropriately with a normal affect.   Labs: Results for orders placed or performed in visit on 01/27/15  POCT urinalysis dipstick  Result Value Ref Range   Color, UA orange (A) yellow   Clarity, UA cloudy (A) clear   Glucose, UA =100 (A) negative   Bilirubin, UA negative negative   Ketones, POC UA trace (5) (A) negative   Spec Grav, UA 1.020    Blood, UA moderate (A) negative   pH, UA 5.0    Protein Ur, POC =100 (A) negative   Urobilinogen, UA 1.0    Nitrite, UA Positive (A) Negative   Leukocytes, UA large (3+) (A) Negative     ASSESSMENT AND PLAN:  69 y.o. year old male with recurrence of remote UTI This chart was scribed in my presence and reviewed by me personally.    ICD-9-CM ICD-10-CM   1. Dysuria 788.1 R30.0 POCT urinalysis dipstick     POCT Microscopic Urinalysis ([facility])     Urine culture     ciprofloxacin (CIPRO) 500 MG tablet  2. Pyuria 791.9 N39.0 Urine culture     ciprofloxacin (CIPRO) 500 MG tablet      Signed, Vincent Haber, MD 01/27/2015 3:17 PM

## 2015-01-29 LAB — URINE CULTURE: Colony Count: >=100000

## 2015-02-02 HISTORY — PX: COLONOSCOPY: SHX174

## 2015-07-31 ENCOUNTER — Ambulatory Visit (INDEPENDENT_AMBULATORY_CARE_PROVIDER_SITE_OTHER): Payer: Medicare Other | Admitting: Family

## 2015-07-31 ENCOUNTER — Encounter: Payer: Self-pay | Admitting: Family

## 2015-07-31 VITALS — BP 132/70 | HR 68 | Temp 97.8°F | Ht 69.0 in | Wt 184.2 lb

## 2015-07-31 DIAGNOSIS — M25561 Pain in right knee: Secondary | ICD-10-CM

## 2015-07-31 MED ORDER — DICLOFENAC SODIUM 1 % TD GEL: 4.0000 g | Freq: Four times a day (QID) | TRANSDERMAL | Status: DC

## 2015-07-31 NOTE — Progress Notes (Signed)
Subjective:    Patient ID: Vincent [name], male    DOB: 02-11-1945, 70 y.o.   MRN: [id number]   Vincent [name] is a 70 y.o. male who presents today for an acute visit.    HPI Comments:  Patient complains of right knee pain which started yesterday. Notes recent long car ride 3 days ago. Describes pain as dull pain when sitting, when standing, pain is intense. Pain doesn't radiate. No catching or giving way of knee. No numbness and tingling.  Pain is worse with standing and walking.  Tried ibuprofen for pain with some relief. No injury or falls. No h/o blood clots or GIB.  He also mentions nausea which started this morning. Resolved. No vomiting, abdominal pain.   Past Medical History  Diagnosis Date  . Lumbar disc disease 02/06/2011  . 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 Allergic rhinitis, cause unspecified 02/08/2011  . GERD (gastroesophageal reflux disease) 02/08/2011  . Elevated PSA 02/08/2011  . BPH (benign prostatic hypertrophy) 02/08/2011  . 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 History of prostatitis 04/11/2011  . 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 Anemia, unspecified 04/11/2011   Allergies: Review of patient's allergies indicates no known allergies. Current Outpatient Prescriptions on File Prior to Visit  Medication Sig Dispense Refill  . aspirin 81 MG EC tablet Take 1 tablet (81 mg total) by mouth daily. Swallow whole. 30 tablet 12  . ciprofloxacin (CIPRO) 500 MG tablet Take 1 tablet (500 mg total) by mouth 2 (two) times daily. 20 tablet 0  . CRANBERRY PO Take 2 capsules by mouth daily.    Marland Kitchen ibuprofen (ADVIL,MOTRIN) 800 MG tablet Take 1 tablet (800 mg total) by mouth every 8 (eight) hours as needed. for pain 90 tablet 1  . lansoprazole (PREVACID) 15 MG capsule Take 15 mg by mouth daily at 12 noon.    . Multiple Vitamins-Minerals (MULTIVITAMIN WITH MINERALS) tablet Take 1 tablet by mouth daily.    . tamsulosin (FLOMAX) 0.4 MG CAPS  capsule TK ONE C PO  HS  8   No current facility-administered medications on file prior to visit.    Social History  Substance Use Topics  . Smoking status: Never Smoker   . Smokeless tobacco: None  . Alcohol Use: No    Review of Systems  Constitutional: Negative for fever and chills.  Respiratory: Negative for cough.   Cardiovascular: Negative for chest pain and palpitations.  Gastrointestinal: Positive for nausea. Negative for vomiting, abdominal pain and abdominal distention.      Objective:    BP 132/70 mmHg  Pulse 68  Temp(Src) 97.8 F (36.6 C) (Oral)  Ht 5\' 9"  (1.753 m)  Wt 184 lb 4 oz (83.575 kg)  BMI 27.20 kg/m2  SpO2 98%   Physical Exam  Constitutional: He appears well-developed and well-nourished.  Cardiovascular: Regular rhythm and normal heart sounds.   Pulmonary/Chest: Effort normal and breath sounds normal. No respiratory distress. He has no wheezes. He has no rhonchi. He has no rales.  Musculoskeletal:       Right knee: He exhibits normal range of motion, no swelling, no effusion, no ecchymosis, no deformity and no laceration. No tenderness found. No medial joint line and no lateral joint line tenderness noted.  Right knee:No tenderness of joint lines with palpation however patient reports medial joint line pain with standing.   No catching with McMurray maneuver. Full ROM. No effusion appreciated. Negative anterior drawer, posterior translation, and lachman's- no laxity  appreciated.   Lymphadenopathy:       Head (left side): No submandibular and no preauricular adenopathy present.  Neurological: He is alert.  Skin: Skin is warm and dry.  Psychiatric: He has a normal mood and affect. His speech is normal and behavior is normal.  Vitals reviewed.      Assessment & Plan:   1. Right knee pain Suspect osteoarthritis. Patient has had no injury. We jointly agreed on conservative therapy at this time with topical Voltaren gel, alternating heat and ice, and  him exercises that I printed for patient. If this dose not improve pain, I advised patient that I would place a referral to sports medicine for him.  - diclofenac sodium (VOLTAREN) 1 % GEL; Apply 4 g topically 4 (four) times daily.  Dispense: 1 Tube; Refill: 3    I am having Mr. Britz maintain his aspirin, multivitamin with minerals, CRANBERRY PO, tamsulosin, ibuprofen, lansoprazole, ciprofloxacin, and finasteride.   Meds ordered this encounter  Medications  . finasteride (PROSCAR) 5 MG tablet    Sig: TK 1 T PO QD    Refill:  11     Start medications as prescribed and explained to patient on After Visit Summary ( AVS). Risks, benefits, and alternatives of the medications and treatment plan prescribed today were discussed, and patient expressed understanding.   Education regarding symptom management and diagnosis given to patient.   Follow-up:Plan follow-up and return precautions given if any worsening symptoms or change in condition.   Continue to follow with [name], MD for routine health maintenance.   Charleston Poot and I agreed with plan.   Mable Paris, FNP

## 2015-07-31 NOTE — Patient Instructions (Signed)
Suspect arthritis. Rest your knee. Do more NON weight bearing activities.  Alternate ice and heat. Apply topical cream I have prescribed.   Let's treat conservatively as we discussed.   Over-the-counter medications you may try for arthritic pain include:   ThermaCare patches   Capsaicin cream   Icy hot  Home exercises as printed separately.  If conservative treatment doesn't yield results, we will consider physical therapy, consult to Sports Therapy, and imaging.   If there is no improvement in your symptoms, or if there is any worsening of symptoms, or if you have any additional concerns, please return for re-evaluation; or, if we are closed, consider going to the Emergency Room for evaluation if symptoms urgent.  Knee Pain Knee pain is a very common symptom and can have many causes. Knee pain often goes away when you follow your health care provider's instructions for relieving pain and discomfort at home. However, knee pain can develop into a condition that needs treatment. Some conditions may include:  Arthritis caused by wear and tear (osteoarthritis).  Arthritis caused by swelling and irritation (rheumatoid arthritis or gout).  A cyst or growth in your knee.  An infection in your knee joint.  An injury that will not heal.  Damage, swelling, or irritation of the tissues that support your knee (torn ligaments or tendinitis). If your knee pain continues, additional tests may be ordered to diagnose your condition. Tests may include X-rays or other imaging studies of your knee. You may also need to have fluid removed from your knee. Treatment for ongoing knee pain depends on the cause, but treatment may include:  Medicines to relieve pain or swelling.  Steroid injections in your knee.  Physical therapy.  Surgery. HOME CARE INSTRUCTIONS  Take medicines only as directed by your health care provider.  Rest your knee and keep it raised (elevated) while you are resting.  Do  not do things that cause or worsen pain.  Avoid high-impact activities or exercises, such as running, jumping rope, or doing jumping jacks.  Apply ice to the knee area:  Put ice in a plastic bag.  Place a towel between your skin and the bag.  Leave the ice on for 20 minutes, 2-3 times a day.  Ask your health care provider if you should wear an elastic knee support.  Keep a pillow under your knee when you sleep.  Lose weight if you are overweight. Extra weight can put pressure on your knee.  Do not use any tobacco products, including cigarettes, chewing tobacco, or electronic cigarettes. If you need help quitting, ask your health care provider. Smoking may slow the healing of any bone and joint problems that you may have. SEEK MEDICAL CARE IF:  Your knee pain continues, changes, or gets worse.  You have a fever along with knee pain.  Your knee buckles or locks up.  Your knee becomes more swollen. SEEK IMMEDIATE MEDICAL CARE IF:   Your knee joint feels hot to the touch.  You have chest pain or trouble breathing.   This information is not intended to replace advice given to you by your health care provider. Make sure you discuss any questions you have with your health care provider.   Document Released: 11/15/2006 Document Revised: 02/08/2014 Document Reviewed: 09/03/2013 Elsevier Interactive Patient Education [employer].

## 2015-07-31 NOTE — Progress Notes (Signed)
Pre visit review using our clinic review tool, if applicable. No additional management support is needed unless otherwise documented below in the visit note. 

## 2015-08-02 ENCOUNTER — Ambulatory Visit (INDEPENDENT_AMBULATORY_CARE_PROVIDER_SITE_OTHER): Payer: Medicare Other

## 2015-08-02 ENCOUNTER — Ambulatory Visit (INDEPENDENT_AMBULATORY_CARE_PROVIDER_SITE_OTHER): Payer: Medicare Other | Admitting: Physician Assistant

## 2015-08-02 VITALS — BP 120/80 | HR 65 | Temp 98.0°F | Resp 18 | Ht 69.0 in | Wt 184.1 lb

## 2015-08-02 DIAGNOSIS — M25561 Pain in right knee: Secondary | ICD-10-CM

## 2015-08-02 MED ORDER — MELOXICAM 7.5 MG PO TABS: 7.5000 mg | ORAL_TABLET | Freq: Every day | ORAL | Status: AC

## 2015-08-02 NOTE — Patient Instructions (Addendum)
IF you received an x-ray today, you will receive an invoice from [facility]. Please contact [facility] at [phone] with questions or concerns regarding your invoice.   IF you received labwork today, you will receive an invoice from [employer]. Please contact [facility] at [phone] with questions or concerns regarding your invoice.   Our billing staff will not be able to assist you with questions regarding bills from these companies.  You will be contacted with the lab results as soon as they are available. The fastest way to get your results is to activate your My Chart account. Instructions are located on the last page of this paperwork. If you have not heard from [place] regarding the results in 2 weeks, please contact this office.    Please ice the knee three times per day for 15 minutes Do not take ibuprofen or naproxen with this pain.  You are able to take tylenol.   Please perform stretches three times per day then follow with the ice mentioned.  Wear the knee brace during the day, and take off at night.  You will let me know if you are not having improvement of the pain within 10-14 days.  I will then pursue an orthopedic consult.  Generic Knee Exercises EXERCISES RANGE OF MOTION (ROM) AND STRETCHING EXERCISES These exercises may help you when beginning to rehabilitate your injury. Your symptoms may resolve with or without further involvement from your physician, physical therapist, or athletic trainer. While completing these exercises, remember:   Restoring tissue flexibility helps normal motion to return to the joints. This allows healthier, less painful movement and activity.  An effective stretch should be held for at least 30 seconds.  A stretch should never be painful. You should only feel a gentle lengthening or release in the stretched tissue. STRETCH - Knee Extension, Prone  Lie on your stomach on a firm surface, such as  a bed or countertop. Place your right / left knee and leg just beyond the edge of the surface. You may wish to place a towel under the far end of your right / left thigh for comfort.  Relax your leg muscles and allow gravity to straighten your knee. Your clinician may advise you to add an ankle weight if more resistance is helpful for you.  You should feel a stretch in the back of your right / left knee. Hold this position for __________ seconds. Repeat __________ times. Complete this stretch __________ times per day. * Your physician, physical therapist, or athletic trainer may ask you to add ankle weight to enhance your stretch.  RANGE OF MOTION - Knee Flexion, Active  Lie on your back with both knees straight. (If this causes back discomfort, bend your opposite knee, placing your foot flat on the floor.)  Slowly slide your heel back toward your buttocks until you feel a gentle stretch in the front of your knee or thigh.  Hold for __________ seconds. Slowly slide your heel back to the starting position. Repeat __________ times. Complete this exercise __________ times per day.  STRETCH - Quadriceps, Prone   Lie on your stomach on a firm surface, such as a bed or padded floor.  Bend your right / left knee and grasp your ankle. If you are unable to reach your ankle or pant leg, use a belt around your foot to lengthen your reach.  Gently pull your heel toward your buttocks. Your knee should not slide out to the  side. You should feel a stretch in the front of your thigh and/or knee.  Hold this position for __________ seconds. Repeat __________ times. Complete this stretch __________ times per day.  STRETCH - Hamstrings, Supine   Lie on your back. Loop a belt or towel over the ball of your right / left foot.  Straighten your right / left knee and slowly pull on the belt to raise your leg. Do not allow the right / left knee to bend. Keep your opposite leg flat on the floor.  Raise the leg  until you feel a gentle stretch behind your right / left knee or thigh. Hold this position for __________ seconds. Repeat __________ times. Complete this stretch __________ times per day.  STRENGTHENING EXERCISES These exercises may help you when beginning to rehabilitate your injury. They may resolve your symptoms with or without further involvement from your physician, physical therapist, or athletic trainer. While completing these exercises, remember:   Muscles can gain both the endurance and the strength needed for everyday activities through controlled exercises.  Complete these exercises as instructed by your physician, physical therapist, or athletic trainer. Progress the resistance and repetitions only as guided.  You may experience muscle soreness or fatigue, but the pain or discomfort you are trying to eliminate should never worsen during these exercises. If this pain does worsen, stop and make certain you are following the directions exactly. If the pain is still present after adjustments, discontinue the exercise until you can discuss the trouble with your clinician. STRENGTH - Quadriceps, Isometrics  Lie on your back with your right / left leg extended and your opposite knee bent.  Gradually tense the muscles in the front of your right / left thigh. You should see either your knee cap slide up toward your hip or increased dimpling just above the knee. This motion will push the back of the knee down toward the floor/mat/bed on which you are lying.  Hold the muscle as tight as you can without increasing your pain for __________ seconds.  Relax the muscles slowly and completely in between each repetition. Repeat __________ times. Complete this exercise __________ times per day.  STRENGTH - Quadriceps, Short Arcs   Lie on your back. Place a __________ inch towel roll under your knee so that the knee slightly bends.  Raise only your lower leg by tightening the muscles in the front of  your thigh. Do not allow your thigh to rise.  Hold this position for __________ seconds. Repeat __________ times. Complete this exercise __________ times per day.  OPTIONAL ANKLE WEIGHTS: Begin with ____________________, but DO NOT exceed ____________________. Increase in 1 pound/0.5 kilogram increments.  STRENGTH - Quadriceps, Straight Leg Raises  Quality counts! Watch for signs that the quadriceps muscle is working to insure you are strengthening the correct muscles and not "cheating" by substituting with healthier muscles.  Lay on your back with your right / left leg extended and your opposite knee bent.  Tense the muscles in the front of your right / left thigh. You should see either your knee cap slide up or increased dimpling just above the knee. Your thigh may even quiver.  Tighten these muscles even more and raise your leg 4 to 6 inches off the floor. Hold for __________ seconds.  Keeping these muscles tense, lower your leg.  Relax the muscles slowly and completely in between each repetition. Repeat __________ times. Complete this exercise __________ times per day.  STRENGTH - Hamstring, Curls  Lay  on your stomach with your legs extended. (If you lay on a bed, your feet may hang over the edge.)  Tighten the muscles in the back of your thigh to bend your right / left knee up to 90 degrees. Keep your hips flat on the bed/floor.  Hold this position for __________ seconds.  Slowly lower your leg back to the starting position. Repeat __________ times. Complete this exercise __________ times per day.  OPTIONAL ANKLE WEIGHTS: Begin with ____________________, but DO NOT exceed ____________________. Increase in 1 pound/0.5 kilogram increments.  STRENGTH - Quadriceps, Squats  Stand in a door frame so that your feet and knees are in line with the frame.  Use your hands for balance, not support, on the frame.  Slowly lower your weight, bending at the hips and knees. Keep your lower  legs upright so that they are parallel with the door frame. Squat only within the range that does not increase your knee pain. Never let your hips drop below your knees.  Slowly return upright, pushing with your legs, not pulling with your hands. Repeat __________ times. Complete this exercise __________ times per day.  STRENGTH - Quadriceps, Wall Slides  Follow guidelines for form closely. Increased knee pain often results from poorly placed feet or knees.  Lean against a smooth wall or door and walk your feet out 18-24 inches. Place your feet hip-width apart.  Slowly slide down the wall or door until your knees bend __________ degrees.* Keep your knees over your heels, not your toes, and in line with your hips, not falling to either side.  Hold for __________ seconds. Stand up to rest for __________ seconds in between each repetition. Repeat __________ times. Complete this exercise __________ times per day. * Your physician, physical therapist, or athletic trainer will alter this angle based on your symptoms and progress.   This information is not intended to replace advice given to you by your health care provider. Make sure you discuss any questions you have with your health care provider.   Document Released: 12/02/2004 Document Revised: 02/08/2014 Document Reviewed: 05/02/2008 Elsevier Interactive Patient Education [employer].

## 2015-08-02 NOTE — Progress Notes (Signed)
[facility] [address], [place] [address] [phone]  Date:  08/02/2015   Name:  Vincent [name]   DOB:  12-17-1945   MRN:  [id number]  PCP:  [name], MD  Chief Complaint  Patient presents with  . Knee Pain    right knee & leg pain since Wed/Thurs     History of Present Illness:  Vincent [name] is a 70 y.o. male patient who presents to [facility] for right leg pain.     4 days of right leg pain, discomfort which has progressively worsened around the knee and below the knee, with radiation up to the top of the knee.  He can barely place pressure on his right leg.  Standing or walking is aggravating.  Sitting is a relief from the pain.  He has no warmth to the knee or numbness or tingling.  No hx of injury to this knee.  No past injuries or problems with the knee.   No sob or cough.  Patient Active Problem List   Diagnosis Date Noted  . Hyperglycemia 09/10/2014  . Neck pain on left side 09/08/2013  . History of prostatitis 04/11/2011  . 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 Anemia, unspecified 04/11/2011  . Allergic rhinitis, cause unspecified 02/08/2011  . GERD (gastroesophageal reflux disease) 02/08/2011  . Elevated PSA 02/08/2011  . BPH (benign prostatic hypertrophy) 02/08/2011  . 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 Lumbar disc disease 02/06/2011  . 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 Preventative health care 12/19/2010    Past Medical History  Diagnosis Date  . Lumbar disc disease 02/06/2011  . 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 Allergic rhinitis, cause unspecified 02/08/2011  . GERD (gastroesophageal reflux disease) 02/08/2011  . Elevated PSA 02/08/2011  . BPH (benign prostatic hypertrophy) 02/08/2011  . 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 History of prostatitis 04/11/2011  . 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 Anemia, unspecified 04/11/2011    Past Surgical  History  Procedure Laterality Date  . Back surgery    . Cyst excision      Social History  Substance Use Topics  . Smoking status: Never Smoker   . Smokeless tobacco: None  . Alcohol Use: No    Family History  Problem Relation Age of Onset  . Blindness Other   . Heart disease Other   . Hypertension Other   . Diabetes Other   . Mental illness Other   . Sudden death Other     No Known Allergies  Medication list has been reviewed and updated.  Current Outpatient Prescriptions on File Prior to Visit  Medication Sig Dispense Refill  . aspirin 81 MG EC tablet Take 1 tablet (81 mg total) by mouth daily. Swallow whole. 30 tablet 12  . CRANBERRY PO Take 2 capsules by mouth daily.    . diclofenac sodium (VOLTAREN) 1 % GEL Apply 4 g topically 4 (four) times daily. 1 Tube 3  . finasteride (PROSCAR) 5 MG tablet TK 1 T PO QD  11  . ibuprofen (ADVIL,MOTRIN) 800 MG tablet Take 1 tablet (800 mg total) by mouth every 8 (eight) hours as needed. for pain 90 tablet 1  . lansoprazole (PREVACID) 15 MG capsule Take 15 mg by mouth daily at 12 noon.    . Multiple Vitamins-Minerals (MULTIVITAMIN WITH MINERALS) tablet Take 1 tablet by mouth daily.    . tamsulosin (FLOMAX) 0.4 MG  CAPS capsule TK ONE C PO  HS  8   No current facility-administered medications on file prior to visit.    ROS ROS otherwise unremarkable unless listed above.   Physical Examination: BP 120/80 mmHg  Pulse 65  Temp(Src) 98 F (36.7 C) (Oral)  Resp 18  Ht 5\' 9"  (1.753 m)  Wt 184 lb 2 oz (83.519 kg)  BMI 27.18 kg/m2  SpO2 98% Ideal Body Weight: Weight in (lb) to have BMI = 25: 168.9  Physical Exam  Constitutional: He is oriented to person, place, and time. He appears well-developed and well-nourished. No distress.  HENT:  Head: Normocephalic and atraumatic.  Eyes: Conjunctivae and EOM are normal. Pupils are equal, round, and reactive to light.  Cardiovascular: Normal rate.   Pulmonary/Chest: Effort normal. No  respiratory distress.  Musculoskeletal:       Right knee: He exhibits normal range of motion, no swelling, no effusion, normal alignment and normal meniscus. No tenderness found. No medial joint line, no lateral joint line and no patellar tendon tenderness noted.  Pain incited with resisted extension.    Neurological: He is alert and oriented to person, place, and time.  Skin: Skin is warm and dry. He is not diaphoretic.  Psychiatric: He has a normal mood and affect. His behavior is normal.     Assessment and Plan: Vincent [name] is a 70 y.o. male who is here today for right knee pain. Advised nsaid and precautionary use.  Ice, and will let me know if his pain continues after 7-10 days.  Given stretches to perform at home.  If not improved will appreciate ortho consult.  Brace given.  Right knee pain - Plan: DG Knee Complete 4 Views Right, meloxicam (MOBIC) 7.5 MG tablet   Ivar Drape, PA-C [facility] 08/02/2015 11:02 AM

## 2015-08-07 ENCOUNTER — Emergency Department (HOSPITAL_BASED_OUTPATIENT_CLINIC_OR_DEPARTMENT_OTHER)
Admit: 2015-08-07 | Discharge: 2015-08-07 | Disposition: A | Discharge status: Home / Self Care | Payer: Medicare Other | Attending: Emergency Medicine | Admitting: Emergency Medicine

## 2015-08-07 ENCOUNTER — Encounter (HOSPITAL_COMMUNITY): Payer: Self-pay | Admitting: Emergency Medicine

## 2015-08-07 ENCOUNTER — Emergency Department (HOSPITAL_COMMUNITY)
Admission: EM | Admit: 2015-08-07 | Discharge: 2015-08-07 | Disposition: A | Discharge status: Home / Self Care | Payer: Medicare Other | Attending: Emergency Medicine | Admitting: Emergency Medicine

## 2015-08-07 ENCOUNTER — Emergency Department (HOSPITAL_COMMUNITY): Payer: Medicare Other

## 2015-08-07 DIAGNOSIS — Z7982 Long term (current) use of aspirin: Secondary | ICD-10-CM | POA: Insufficient documentation

## 2015-08-07 DIAGNOSIS — Z79899 Other long term (current) drug therapy: Secondary | ICD-10-CM | POA: Diagnosis not present

## 2015-08-07 DIAGNOSIS — Z791 Long term (current) use of non-steroidal anti-inflammatories (NSAID): Secondary | ICD-10-CM | POA: Diagnosis not present

## 2015-08-07 DIAGNOSIS — M5030 Other cervical disc degeneration, unspecified cervical region: Secondary | ICD-10-CM | POA: Diagnosis not present

## 2015-08-07 DIAGNOSIS — M25561 Pain in right knee: Secondary | ICD-10-CM

## 2015-08-07 MED ORDER — OXYCODONE-ACETAMINOPHEN 5-325 MG PO TABS
1.0000 | ORAL_TABLET | Freq: Once | ORAL | Status: AC
  Administered 2015-08-07: 1 via ORAL
  Filled 2015-08-07: qty 1

## 2015-08-07 MED ORDER — HYDROCODONE-ACETAMINOPHEN 5-325 MG PO TABS: 1.0000 | ORAL_TABLET | Freq: Four times a day (QID) | ORAL | Status: DC | PRN

## 2015-08-07 NOTE — ED Provider Notes (Signed)
CSN: [id number]     Arrival date & time 08/07/15  0124 History  By signing my name below, I, Reola Mosher, attest that this documentation has been prepared under the direction and in the presence of Delora Fuel, MD.  Electronically Signed: Reola Mosher, ED Scribe. 08/07/2015. 2:45 AM.   Chief Complaint  Patient presents with  . Knee Pain   The history is provided by the patient. No language interpreter was used.   HPI Comments: Vincent [name] is a 70 y.o. male with a PMHx significant for blindness, lumbar disc disease, GERD, DDD, and anemia who presents to the Emergency Department complaining of gradual onset, gradually worsening, constant right knee pain onset 2 weeks ago. He has associated lower back pain. Pt denies any known trauma to the knee or recent falls. Pt reports that he recently took a long trip where he sat for six hours, but had intermittent breaks where he ambulated and his pain began a few days after that. His knee pain is rated 10/10, and it is exacerbated by ambulation, long periods of standing, and when he is laying down to sleep. Pt has been taking Motrin and Meloxicam with no relief of his pain. He has also been wearing a knee brace which has not helped with his pain. He denies any recent falls. Pt was seen at [facility] on 08/02/15 where they performed an xray with no abnormalities. Pt is not complaining of any other symptoms.   Past Medical History  Diagnosis Date  . Lumbar disc disease 02/06/2011  . 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 Allergic rhinitis, cause unspecified 02/08/2011  . GERD (gastroesophageal reflux disease) 02/08/2011  . Elevated PSA 02/08/2011  . BPH (benign prostatic hypertrophy) 02/08/2011  . 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 History of prostatitis 04/11/2011  . 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 Anemia, unspecified 04/11/2011   Past Surgical History  Procedure Laterality Date  . Back surgery    . Cyst  excision     Family History  Problem Relation Age of Onset  . Blindness Other   . Heart disease Other   . Hypertension Other   . Diabetes Other   . Mental illness Other   . Sudden death Other    Social History  Substance Use Topics  . Smoking status: Never Smoker   . Smokeless tobacco: None  . Alcohol Use: No    Review of Systems  Constitutional: Negative for fever.  Musculoskeletal: Positive for myalgias, back pain (lower) and arthralgias.  All other systems reviewed and are negative.  Allergies  Review of patient's allergies indicates no known allergies.  Home Medications   Prior to Admission medications   Medication Sig Start Date End Date Taking? Authorizing Provider  aspirin 81 MG EC tablet Take 1 tablet (81 mg total) by mouth daily. Swallow whole. 08/01/12  Yes Biagio Borg, MD  CRANBERRY PO Take 2 capsules by mouth daily.   Yes Historical Provider, MD  diclofenac sodium (VOLTAREN) 1 % GEL Apply 4 g topically 4 (four) times daily. 07/31/15  Yes Burnard Hawthorne, FNP  finasteride (PROSCAR) 5 MG tablet TK 1 T PO QD 05/13/15  Yes Historical Provider, MD  ibuprofen (ADVIL,MOTRIN) 800 MG tablet Take 1 tablet (800 mg total) by mouth every 8 (eight) hours as needed. for pain 12/23/14  Yes Biagio Borg, MD  lansoprazole (PREVACID) 15 MG capsule Take 15 mg by mouth daily at 12 noon.  Yes Historical Provider, MD  meloxicam (MOBIC) 7.5 MG tablet Take 1 tablet (7.5 mg total) by mouth daily. 08/02/15 09/01/15 Yes Stephanie D English, PA  Multiple Vitamins-Minerals (MULTIVITAMIN WITH MINERALS) tablet Take 1 tablet by mouth daily.   Yes Historical Provider, MD  tamsulosin (FLOMAX) 0.4 MG CAPS capsule TK ONE C PO  HS 07/12/14  Yes Historical Provider, MD   BP 166/99 mmHg  Pulse 70  Temp(Src) 98.1 F (36.7 C) (Oral)  Resp 16  SpO2 100%   Physical Exam  Constitutional: He is oriented to person, place, and time. He appears well-developed and well-nourished.  HENT:  Head: Normocephalic  and atraumatic.  Eyes: Conjunctivae and EOM are normal. Pupils are equal, round, and reactive to light.  Neck: Normal range of motion. Neck supple. No JVD present.  Cardiovascular: Normal rate and normal heart sounds.   No murmur heard. Pulmonary/Chest: Effort normal and breath sounds normal. He has no wheezes. He has no rales. He exhibits no tenderness.  Abdominal: Soft. Bowel sounds are normal. He exhibits no distension and no mass. There is no tenderness.  Musculoskeletal: Normal range of motion. He exhibits no edema.  Mild tenderness on the lower lumber spine with mild paralumbar spasm. Negative straight leg raise. Full ROM of right hip without obvious pain.  Right knee/leg: No TTP, no effusion, no instability. Lachman and McMurray's tests are negative. No calf tenderness or swelling. Calf circumference is symmetric.   Neurological: He is alert and oriented to person, place, and time. No cranial nerve deficit. He exhibits normal muscle tone. Coordination normal.  Skin: Skin is warm and dry. No rash noted.  Psychiatric: He has a normal mood and affect. His behavior is normal. Judgment and thought content normal.  Nursing note and vitals reviewed.  ED Course  Procedures (including critical care time)  DIAGNOSTIC STUDIES: Oxygen Saturation is 100% on RA, normal by my interpretation.   COORDINATION OF CARE: 2:43 AM-Discussed next steps with pt including DG right hip. Pt verbalized understanding and is agreeable with the plan.   Imaging Review Dg Hip Unilat With Pelvis 2-3 Views Right  08/07/2015  CLINICAL DATA:  Right hip pain radiating to the knee, onset 2 days prior. No known injury. EXAM: DG HIP (WITH OR WITHOUT PELVIS) 2-3V RIGHT COMPARISON:  None. FINDINGS: The cortical margins of the bony pelvis are intact. No fracture. Pubic symphysis and sacroiliac joints are congruent. Both femoral heads are well-seated in the respective acetabula. Age-related acetabular spurring. No bony  destructive change. IMPRESSION: No acute bony abnormality. 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 Electronically Signed   By: Jeb Levering M.D.   On: 08/07/2015 03:21    I have personally reviewed and evaluated these images as part of my medical decision-making.  MDM   Final diagnoses:  Pain in right knee    Right knee pain of uncertain cause. Old records are reviewed and he has been seen twice in the last week. X-rays showed no significant degenerative changes. He had been treated with Voltaren gel and with oral meloxicam with no relief. Exam does not show significant abnormality in the knee. Consider causes of referred pain. He is sent for x-rays of the right hip which were negative. He'll be sent for venous Doppler. If negative, consider sciatica. He was given dose of oxycodone have acetaminophen for pain. Case will be signed out to Dr. Tomi Bamberger.  I personally performed the services described in this documentation, which was scribed in my presence. The recorded information  has been reviewed and is accurate.      Delora Fuel, MD [phone] [id number]

## 2015-08-07 NOTE — Discharge Instructions (Signed)

## 2015-08-07 NOTE — ED Notes (Addendum)
Pt from home with his wife with complaints of right knee pain. Pt states this began around Tuesday or Wednesday of last week and has gotten progressively worse since then. Pt was seen at [facility] on 7/1 and had an xray done. Pt states he was told at [facility] to follow up in about 2 weeks if the pain persists. Pt states he has taken was given a rx for meloxicam which he last took 7/5 in the morning. Pt also has been taking motrin with no relief.   Pt is blind in both eyes

## 2015-08-07 NOTE — ED Notes (Signed)
Patient transported to X-ray 

## 2015-08-07 NOTE — ED Provider Notes (Signed)
Doppler study negative for DVT. Follow up with PCP to discuss further evaluation, possible MRI for further evaluation of referred sources of pain.  Dorie Rank, MD 08/07/15 [phone]

## 2015-08-07 NOTE — Progress Notes (Signed)
*  Preliminary Results* Right lower extremity venous duplex completed. Patient was unwilling to move into proper position for exam, therefore the exam was technically difficult. Visualized veins of the right lower extremity are negative for deep vein thrombosis. There is no evidence of right Baker's cyst.  Preliminary results discussed with Dr. Tomi Bamberger.  08/07/2015 8:44 AM  Maudry Mayhew, RVT, RDCS, RDMS

## 2015-08-16 ENCOUNTER — Other Ambulatory Visit: Payer: Self-pay | Admitting: Orthopedic Surgery

## 2015-08-16 DIAGNOSIS — M25561 Pain in right knee: Secondary | ICD-10-CM

## 2015-08-23 ENCOUNTER — Ambulatory Visit
Admission: RE | Admit: 2015-08-23 | Discharge: 2015-08-23 | Disposition: A | Discharge status: Home / Self Care | Payer: Medicare Other | Source: Ambulatory Visit | Attending: Orthopedic Surgery | Admitting: Orthopedic Surgery

## 2015-08-23 DIAGNOSIS — M25561 Pain in right knee: Secondary | ICD-10-CM

## 2015-09-18 ENCOUNTER — Ambulatory Visit (INDEPENDENT_AMBULATORY_CARE_PROVIDER_SITE_OTHER): Payer: Medicare Other | Admitting: Internal Medicine

## 2015-09-18 ENCOUNTER — Encounter: Payer: Self-pay | Admitting: Internal Medicine

## 2015-09-18 ENCOUNTER — Other Ambulatory Visit (INDEPENDENT_AMBULATORY_CARE_PROVIDER_SITE_OTHER): Payer: Medicare Other

## 2015-09-18 VITALS — BP 122/78 | HR 57 | Temp 97.9°F | Resp 20 | Wt 180.0 lb

## 2015-09-18 DIAGNOSIS — E119 Type 2 diabetes mellitus without complications: Secondary | ICD-10-CM

## 2015-09-18 DIAGNOSIS — R6889 Other general symptoms and signs: Secondary | ICD-10-CM | POA: Diagnosis not present

## 2015-09-18 DIAGNOSIS — G8929 Other chronic pain: Secondary | ICD-10-CM

## 2015-09-18 DIAGNOSIS — Z1159 Encounter for screening for other viral diseases: Secondary | ICD-10-CM

## 2015-09-18 DIAGNOSIS — M545 Low back pain, unspecified: Secondary | ICD-10-CM

## 2015-09-18 DIAGNOSIS — Z0000 Encounter for general adult medical examination without abnormal findings: Secondary | ICD-10-CM

## 2015-09-18 DIAGNOSIS — Z0001 Encounter for general adult medical examination with abnormal findings: Secondary | ICD-10-CM

## 2015-09-18 DIAGNOSIS — R739 Hyperglycemia, unspecified: Secondary | ICD-10-CM | POA: Diagnosis not present

## 2015-09-18 DIAGNOSIS — Z1211 Encounter for screening for malignant neoplasm of colon: Secondary | ICD-10-CM | POA: Diagnosis not present

## 2015-09-18 DIAGNOSIS — R972 Elevated prostate specific antigen [PSA]: Secondary | ICD-10-CM

## 2015-09-18 LAB — LIPID PANEL
CHOL/HDL RATIO: 3
Cholesterol: 178 mg/dL (ref 0–200)
HDL: 56.6 mg/dL (ref >39.00)
LDL CALC: 106 mg/dL — AB (ref 0–99)
NonHDL: 121.35
Triglycerides: 75 mg/dL (ref 0.0–149.0)
VLDL: 15 mg/dL (ref 0.0–40.0)

## 2015-09-18 LAB — URINALYSIS, ROUTINE W REFLEX MICROSCOPIC
BILIRUBIN URINE: NEGATIVE
HGB URINE DIPSTICK: NEGATIVE
Ketones, ur: NEGATIVE
LEUKOCYTES UA: NEGATIVE
NITRITE: NEGATIVE
RBC / HPF: NONE SEEN (ref 0–?)
Specific Gravity, Urine: 1.02 (ref 1.000–1.030)
TOTAL PROTEIN, URINE-UPE24: NEGATIVE
Urine Glucose: NEGATIVE
Urobilinogen, UA: 0.2 (ref 0.0–1.0)
pH: 6 (ref 5.0–8.0)

## 2015-09-18 LAB — BASIC METABOLIC PANEL
BUN: 14 mg/dL (ref 6–23)
CALCIUM: 9.7 mg/dL (ref 8.4–10.5)
CO2: 29 mEq/L (ref 19–32)
Chloride: 105 mEq/L (ref 96–112)
Creatinine, Ser: 1 mg/dL (ref 0.40–1.50)
GFR: 95.1 mL/min (ref >60.00)
Glucose, Bld: 111 mg/dL — ABNORMAL HIGH (ref 70–99)
POTASSIUM: 4.3 meq/L (ref 3.5–5.1)
SODIUM: 138 meq/L (ref 135–145)

## 2015-09-18 LAB — CBC WITH DIFFERENTIAL/PLATELET
BASOS PCT: 0.3 % (ref 0.0–3.0)
Basophils Absolute: 0 10*3/uL (ref 0.0–0.1)
EOS PCT: 3.7 % (ref 0.0–5.0)
Eosinophils Absolute: 0.2 10*3/uL (ref 0.0–0.7)
HEMATOCRIT: 41.6 % (ref 39.0–52.0)
HEMOGLOBIN: 13.7 g/dL (ref 13.0–17.0)
Lymphocytes Relative: 31 % (ref 12.0–46.0)
Lymphs Abs: 1.4 10*3/uL (ref 0.7–4.0)
MCHC: 33 g/dL (ref 30.0–36.0)
MCV: 86.3 fl (ref 78.0–100.0)
MONO ABS: 0.5 10*3/uL (ref 0.1–1.0)
MONOS PCT: 10.8 % (ref 3.0–12.0)
Neutro Abs: 2.5 10*3/uL (ref 1.4–7.7)
Neutrophils Relative %: 54.2 % (ref 43.0–77.0)
Platelets: 203 10*3/uL (ref 150.0–400.0)
RBC: 4.82 Mil/uL (ref 4.22–5.81)
RDW: 14.2 % (ref 11.5–15.5)
WBC: 4.6 10*3/uL (ref 4.0–10.5)

## 2015-09-18 LAB — HEPATIC FUNCTION PANEL
ALBUMIN: 4.1 g/dL (ref 3.5–5.2)
ALK PHOS: 88 U/L (ref 39–117)
ALT: 15 U/L (ref 0–53)
AST: 17 U/L (ref 0–37)
BILIRUBIN TOTAL: 0.4 mg/dL (ref 0.2–1.2)
Bilirubin, Direct: 0.1 mg/dL (ref 0.0–0.3)
Total Protein: 7.7 g/dL (ref 6.0–8.3)

## 2015-09-18 LAB — TSH: TSH: 0.66 u[IU]/mL (ref 0.35–4.50)

## 2015-09-18 LAB — HEMOGLOBIN A1C: Hgb A1c MFr Bld: 7 % — ABNORMAL HIGH (ref 4.6–6.5)

## 2015-09-18 MED ORDER — IBUPROFEN 800 MG PO TABS: 800.0000 mg | ORAL_TABLET | Freq: Three times a day (TID) | ORAL | 1 refills | Status: DC | PRN

## 2015-09-18 NOTE — Assessment & Plan Note (Signed)

## 2015-09-18 NOTE — Assessment & Plan Note (Addendum)
Recent new onset, diet controlled, for f/u lab, diet, excercse, wt control  In addition to the time spent performing CPE, I spent an additional 15 minutes face to face,in which greater than 50% of this time was spent in counseling and coordination of care for patient's illness as documented.

## 2015-09-18 NOTE — Assessment & Plan Note (Signed)
Now seeing urology every 3 mo per pt, ok for f/u psa, asympt,  to f/u any worsening symptoms or concerns

## 2015-09-18 NOTE — Progress Notes (Signed)
Subjective:    Patient ID: Vincent [name], male    DOB: January 29, 1946, 70 y.o.   MRN: [id number]  HPI  Here for wellness and f/u;  Overall doing ok;  Pt denies Chest pain, worsening SOB, DOE, wheezing, orthopnea, PND, worsening LE edema, palpitations, dizziness or syncope.  Pt denies neurological change such as new headache, facial or extremity weakness.  Pt denies polydipsia, polyuria, or low sugar symptoms. Pt states overall good compliance with treatment and medications, good tolerability, and has been trying to follow appropriate diet.  Pt denies worsening depressive symptoms, suicidal ideation or panic. No fever, night sweats, wt loss, loss of appetite, or other constitutional symptoms.  Pt states good ability with ADL's, has low fall risk, home safety reviewed and adequate, no other significant changes in hearing or vision (complete blind) , but occasionally active with exercise, usually treadmill 4 days per wk. Curently no significant pain with right knee s/p cortisone injection. ibuprofen helps for now, but ran out, needs refill.  Also has new onset DM from last visit, but has not required DM med, and declines DM education for now  Denies urinary symptoms such as dysuria, frequency, urgency, flank pain, hematuria or n/v, fever, chills. Denies worsening reflux, abd pain, dysphagia, n/v, bowel change or blood.  Pt continues to have recurring LBP without change in severity, bowel or bladder change, fever, wt loss,  worsening LE pain/numbness/weakness, gait change or falls.  Pt continues to have recurring LBP without change in severity, bowel or bladder change, fever, wt loss,  worsening LE pain/numbness/weakness, gait change or falls.  Past Medical History:  Diagnosis Date  . Allergic rhinitis, cause unspecified 02/08/2011  . Anemia, unspecified 04/11/2011  . 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 BPH (benign prostatic hypertrophy) 02/08/2011  . 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 DDD (degenerative disc disease), cervical  04/11/2011  . Elevated PSA 02/08/2011  . GERD (gastroesophageal reflux disease) 02/08/2011  . 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 History of prostatitis 04/11/2011  . Lumbar disc disease 02/06/2011   Past Surgical History:  Procedure Laterality Date  . BACK SURGERY    . CYST EXCISION      reports that he has never smoked. He does not have any smokeless tobacco history on file. He reports that he does not drink alcohol or use drugs. family history includes Blindness in his other; Diabetes in his other; Heart disease in his other; Hypertension in his other; Mental illness in his other; Sudden death in his other. No Known Allergies Current Outpatient Prescriptions on File Prior to Visit  Medication Sig Dispense Refill  . aspirin 81 MG EC tablet Take 1 tablet (81 mg total) by mouth daily. Swallow whole. 30 tablet 12  . CRANBERRY PO Take 2 capsules by mouth daily.    . diclofenac sodium (VOLTAREN) 1 % GEL Apply 4 g topically 4 (four) times daily. 1 Tube 3  . finasteride (PROSCAR) 5 MG tablet TK 1 T PO QD  11  . lansoprazole (PREVACID) 15 MG capsule Take 15 mg by mouth daily at 12 noon.    . Multiple Vitamins-Minerals (MULTIVITAMIN WITH MINERALS) tablet Take 1 tablet by mouth daily.    . tamsulosin (FLOMAX) 0.4 MG CAPS capsule TK ONE C PO  HS  8   No current facility-administered medications on file prior to visit.      Review of Systems  Constitutional: Negative for unusual diaphoresis or night sweats HENT: Negative for ear swelling or discharge Eyes: Negative for worsening visual  haziness  Respiratory: Negative for choking and stridor.   Gastrointestinal: Negative for distension or worsening eructation Genitourinary: Negative for retention or change in urine volume.  Musculoskeletal: Negative for other MSK pain or swelling Skin: Negative for color change and worsening wound Neurological: Negative for tremors and numbness other than noted  Psychiatric/Behavioral: Negative for  decreased concentration or agitation other than above      Objective:   Physical Exam BP 122/78   Pulse (!) 57   Temp 97.9 F (36.6 C) (Oral)   Resp 20   Wt 180 lb (81.6 kg)   SpO2 97%   BMI 26.58 kg/m  VS noted,  Constitutional: Pt is oriented to person, place, and time. Appears well-developed and well-nourished, in no significant distress Head: Normocephalic and atraumatic  Eyes: Conjunctivae and EOM are normal. Pupils are equal, round, and reactive to light Right Ear: External ear normal.  Left Ear: External ear normal Nose: Nose normal.  Mouth/Throat: Oropharynx is clear and moist  Neck: Normal range of motion. Neck supple. No JVD present. No tracheal deviation present or significant neck LA or mass Cardiovascular: Normal rate, regular rhythm, normal heart sounds and intact distal pulses.   Pulmonary/Chest: Effort normal and breath sounds without rales or wheezing  Abdominal: Soft. Bowel sounds are normal. NT. No HSM  Musculoskeletal: Normal range of motion. Exhibits no edema Lymphadenopathy: Has no cervical adenopathy.  Neurological: Pt is alert and oriented to person, place, and time. Pt has normal reflexes. No cranial nerve deficit except blindness,. Motor grossly intact Skin: Skin is warm and dry. No rash noted or new ulcers Psychiatric:  Has normal mood and affect. Behavior is normal.   EXAM: most recent July 2017 -  MRI OF THE RIGHT KNEE WITHOUT CONTRAST Negative for meniscal or ligament tear.  No internal derangement. Mild degenerative change patellofemoral and lateral compartments.     Assessment & Plan:

## 2015-09-18 NOTE — Patient Instructions (Addendum)
You had the flu shot today  Please continue all other medications as before, and refills have been done if requested.  Please have the pharmacy call with any other refills you may need.  Please continue your efforts at being more active, low cholesterol diet, and weight control.  You are otherwise up to date with prevention measures today.  Please keep your appointments with your specialists as you may have planned  You will be contacted regarding the referral for: colonoscopy  Please go to the LAB in the Basement (turn left off the elevator) for the tests to be done today  You will be contacted by phone if any changes need to be made immediately.  Otherwise, you will receive a letter about your results with an explanation, but please check with MyChart first.  Please remember to sign up for MyChart if you have not done so, as this will be important to you in the future with finding out test results, communicating by private email, and scheduling acute appointments online when needed.  Please return in 6 months, or sooner if needed, with Lab testing done 3-5 days before

## 2015-09-18 NOTE — Progress Notes (Signed)
Pre visit review using our clinic review tool, if applicable. No additional management support is needed unless otherwise documented below in the visit note. 

## 2015-09-18 NOTE — Assessment & Plan Note (Signed)
stable overall by history and exam, recent data d/w pt including right knee MRI as well, and pt to continue medical treatment as before,  to f/u any worsening symptoms or concerns

## 2015-09-19 ENCOUNTER — Encounter: Payer: Self-pay | Admitting: Internal Medicine

## 2015-09-19 ENCOUNTER — Other Ambulatory Visit: Payer: Self-pay | Admitting: Internal Medicine

## 2015-09-19 LAB — HEPATITIS C ANTIBODY: HCV AB: NEGATIVE

## 2015-09-19 MED ORDER — METFORMIN HCL ER 500 MG PO TB24: 500.0000 mg | ORAL_TABLET | Freq: Every day | ORAL | 3 refills | Status: DC

## 2015-09-19 MED ORDER — ROSUVASTATIN CALCIUM 10 MG PO TABS: 10.0000 mg | ORAL_TABLET | Freq: Every day | ORAL | 3 refills | Status: DC

## 2015-09-19 NOTE — Telephone Encounter (Signed)
Medication refills sent

## 2015-09-19 NOTE — Addendum Note (Signed)
Addended by: Della Goo C on: 09/19/2015 01:16 PM   Modules accepted: Orders

## 2015-09-24 ENCOUNTER — Telehealth: Payer: Self-pay | Admitting: Internal Medicine

## 2015-09-24 NOTE — Telephone Encounter (Signed)
Please follow up with patient on lab results.

## 2015-09-25 NOTE — Telephone Encounter (Signed)
Called patient unable to reach left message to give us a call back.

## 2015-10-15 ENCOUNTER — Encounter: Payer: Self-pay | Admitting: Gastroenterology

## 2015-11-14 ENCOUNTER — Other Ambulatory Visit: Payer: Self-pay | Admitting: Internal Medicine

## 2015-11-27 ENCOUNTER — Ambulatory Visit: Payer: Medicare Other | Admitting: *Deleted

## 2015-11-27 VITALS — Ht 69.0 in | Wt 178.4 lb

## 2015-11-27 DIAGNOSIS — Z1211 Encounter for screening for malignant neoplasm of colon: Secondary | ICD-10-CM

## 2015-11-27 MED ORDER — SUPREP BOWEL PREP KIT 17.5-3.13-1.6 GM/177ML PO SOLN: 1.0000 | Freq: Once | ORAL | 0 refills | Status: AC

## 2015-11-27 NOTE — Progress Notes (Signed)
Patient denies any allergies to egg or soy products. Patient denies complications with anesthesia/sedation.  Patient denies oxygen use at home and denies diet medications. Emmi instructions for colonoscopy  explained but patient denied.     Patient is blind, accompany by wife.  Wife sign consent form for her husband.

## 2015-11-28 ENCOUNTER — Encounter: Payer: Self-pay | Admitting: Gastroenterology

## 2015-12-11 ENCOUNTER — Ambulatory Visit (AMBULATORY_SURGERY_CENTER): Payer: Medicare Other | Admitting: Gastroenterology

## 2015-12-11 ENCOUNTER — Encounter: Payer: Self-pay | Admitting: Gastroenterology

## 2015-12-11 VITALS — BP 144/98 | HR 71 | Temp 97.7°F | Resp 16 | Ht 69.0 in | Wt 178.0 lb

## 2015-12-11 DIAGNOSIS — Z1212 Encounter for screening for malignant neoplasm of rectum: Secondary | ICD-10-CM | POA: Diagnosis not present

## 2015-12-11 DIAGNOSIS — Z1211 Encounter for screening for malignant neoplasm of colon: Secondary | ICD-10-CM

## 2015-12-11 DIAGNOSIS — D120 Benign neoplasm of cecum: Secondary | ICD-10-CM

## 2015-12-11 LAB — GLUCOSE, CAPILLARY
GLUCOSE-CAPILLARY: 80 mg/dL (ref 65–99)
Glucose-Capillary: 81 mg/dL (ref 65–99)

## 2015-12-11 MED ORDER — DEXTROSE 5 % IV SOLN: INTRAVENOUS | Status: DC

## 2015-12-11 MED ORDER — SODIUM CHLORIDE 0.9 % IV SOLN: 500.0000 mL | INTRAVENOUS | Status: DC

## 2015-12-11 NOTE — Progress Notes (Signed)
Called to room to assist during endoscopic procedure.  Patient ID and intended procedure confirmed with present staff. Received instructions for my participation in the procedure from the performing physician.  

## 2015-12-11 NOTE — Progress Notes (Signed)
A/ox3 pleased with MAC, report to Robbin RN 

## 2015-12-11 NOTE — Patient Instructions (Signed)
Colon polyps removed today. Handouts given on polyps, hemorrhoids, diverticulosis.  NO ibuprofen,naproxen,or any other non-steriodal anti-inflammatory medications for 2 weeks! Call us with any questions or concerns. Thank you!   YOU HAD AN ENDOSCOPIC PROCEDURE TODAY AT [facility]:   Refer to the procedure report that was given to you for any specific questions about what was found during the examination.  If the procedure report does not answer your questions, please call your gastroenterologist to clarify.  If you requested that your care partner not be given the details of your procedure findings, then the procedure report has been included in a sealed envelope for you to review at your convenience later.  YOU SHOULD EXPECT: Some feelings of bloating in the abdomen. Passage of more gas than usual.  Walking can help get rid of the air that was put into your GI tract during the procedure and reduce the bloating. If you had a lower endoscopy (such as a colonoscopy or flexible sigmoidoscopy) you may notice spotting of blood in your stool or on the toilet paper. If you underwent a bowel prep for your procedure, you may not have a normal bowel movement for a few days.  Please Note:  You might notice some irritation and congestion in your nose or some drainage.  This is from the oxygen used during your procedure.  There is no need for concern and it should clear up in a day or so.  SYMPTOMS TO REPORT IMMEDIATELY:   Following lower endoscopy (colonoscopy or flexible sigmoidoscopy):  Excessive amounts of blood in the stool  Significant tenderness or worsening of abdominal pains  Swelling of the abdomen that is new, acute  Fever of 100F or higher   For urgent or emergent issues, a gastroenterologist can be reached at any hour by calling [phone].   DIET:  We do recommend a small meal at first, but then you may proceed to your regular diet.  Drink plenty of fluids but you  should avoid alcoholic beverages for 24 hours.  ACTIVITY:  You should plan to take it easy for the rest of today and you should NOT DRIVE or use heavy machinery until tomorrow (because of the sedation medicines used during the test).    FOLLOW UP: Our staff will call the number listed on your records the next business day following your procedure to check on you and address any questions or concerns that you may have regarding the information given to you following your procedure. If we do not reach you, we will leave a message.  However, if you are feeling well and you are not experiencing any problems, there is no need to return our call.  We will assume that you have returned to your regular daily activities without incident.  If any biopsies were taken you will be contacted by phone or by letter within the next 1-3 weeks.  Please call us at [phone] if you have not heard about the biopsies in 3 weeks.    SIGNATURES/CONFIDENTIALITY: You and/or your care partner have signed paperwork which will be entered into your electronic medical record.  These signatures attest to the fact that that the information above on your After Visit Summary has been reviewed and is understood.  Full responsibility of the confidentiality of this discharge information lies with you and/or your care-partner.

## 2015-12-11 NOTE — Op Note (Signed)
[facility] Patient Name: Vincent Vincent [name] Procedure Date: 12/11/2015 8:33 AM MRN: [id number] Endoscopist: Vincent Lipps P. Monai Hindes MD, Vincent [name] Age: 70 Referring Vincent [name]:  Date of Birth: 26-Aug-1945 Gender: Male Account #: [id number] Procedure:                Colonoscopy Indications:              Screening for malignant neoplasm in the colon Medicines:                Monitored Anesthesia Care Procedure:                Pre-Anesthesia Assessment:                           - Prior to the procedure, a History and Physical                            was performed, and patient medications and                            allergies were reviewed. The patient's tolerance of                            previous anesthesia was also reviewed. The risks                            and benefits of the procedure and the sedation                            options and risks were discussed with the patient.                            All questions were answered, and informed consent                            was obtained. Prior Anticoagulants: The patient has                            taken aspirin, last dose was 1 day prior to                            procedure. ASA Grade Assessment: II - A patient                            with mild systemic disease. After reviewing the                            risks and benefits, the patient was deemed in                            satisfactory condition to undergo the procedure.                           After obtaining informed consent, the colonoscope  was passed under direct vision. Throughout the                            procedure, the patient's blood pressure, pulse, and                            oxygen saturations were monitored continuously. The                            Model CF-HQ190L [id number]) scope was introduced                            through the anus and advanced to the the cecum,   identified by appendiceal orifice and ileocecal                            valve. The colonoscopy was performed without                            difficulty. The patient tolerated the procedure                            well. The quality of the bowel preparation was                            good. The ileocecal valve, appendiceal orifice, and                            rectum were photographed. Scope In: 8:37:42 AM Scope Out: 8:54:30 AM Scope Withdrawal Time: 0 hours 13 minutes 55 seconds  Total Procedure Duration: 0 hours 16 minutes 48 seconds  Findings:                 The perianal and digital rectal examinations were                            normal.                           Medium-mouthed diverticula were found in the left                            colon.                           A 5-6 mm polyp was found in the cecum. The polyp                            was sessile. The polyp was removed with a cold                            snare. Resection and retrieval were complete.                           Non-bleeding internal hemorrhoids were found during  retroflexion.                           The exam was otherwise without abnormality. Complications:            No immediate complications. Estimated blood loss:                            Minimal. Estimated Blood Loss:     Estimated blood loss was minimal. Impression:               - Diverticulosis in the left colon.                           - One 5-6 mm polyp in the cecum, removed with a                            cold snare. Resected and retrieved.                           - Non-bleeding internal hemorrhoids.                           - The examination was otherwise normal. Recommendation:           - Patient has a contact number available for                            emergencies. The signs and symptoms of potential                            delayed complications were discussed with the                             patient. Return to normal activities tomorrow.                            Written discharge instructions were provided to the                            patient.                           - Resume previous diet.                           - Continue present medications.                           - No ibuprofen, naproxen, or other non-steroidal                            anti-inflammatory drugs for 2 weeks after polyp                            removal.                           -  Await pathology results.                           - Repeat colonoscopy is recommended for                            surveillance. The colonoscopy date will be                            determined after pathology results from today's                            exam become available for review. Vincent Lipps P. Anh Bigos MD, Vincent [name] 12/11/2015 8:59:41 AM This report has been signed electronically.

## 2015-12-12 ENCOUNTER — Telehealth: Payer: Self-pay | Admitting: *Deleted

## 2015-12-12 NOTE — Telephone Encounter (Signed)
  Follow up Call-  Call back number 12/11/2015  Post procedure Call Back phone  # [phone]  Permission to leave phone message Yes  Some recent data might be hidden     Patient questions:  Do you have a fever, pain , or abdominal swelling? No. Pain Score  0 *  Have you tolerated food without any problems? Yes.      Have you been able to return to your normal activities? Yes.    Do you have any questions about your discharge instructions: Diet   No. Medications  No. Follow up visit  No.  Do you have questions or concerns about your Care? No.  Actions: * If pain score is 4 or above: No action needed, pain <4.

## 2015-12-18 ENCOUNTER — Encounter: Payer: Self-pay | Admitting: Gastroenterology

## 2016-01-28 ENCOUNTER — Other Ambulatory Visit: Payer: Self-pay | Admitting: Internal Medicine

## 2016-01-28 ENCOUNTER — Telehealth: Payer: Self-pay | Admitting: Gastroenterology

## 2016-01-28 NOTE — Telephone Encounter (Signed)
Patient did not receive letter with pathology results. I read him what the letter said, explained that in 5 years would have a recall to repeat the colonoscopy. I have also printed another copy of the letter and mailed it to him, address verified.

## 2016-01-29 NOTE — Telephone Encounter (Signed)
Done erx for ibuprofen  But also possibly needs evaluation if conts to need this, please ask pt if ok to refer to Dr Smith/sport med in this office, or ortho if he already has one

## 2016-01-29 NOTE — Telephone Encounter (Signed)
Please advise, thanks.

## 2016-01-29 NOTE — Telephone Encounter (Signed)
Patient refused needing sports med or ortho referral, states he doesn't take them very often, only occasionally for back ache pain---no referral needed per patient but advised that rx has been sent to [facility]

## 2016-02-05 ENCOUNTER — Ambulatory Visit (INDEPENDENT_AMBULATORY_CARE_PROVIDER_SITE_OTHER): Payer: Medicare Other | Admitting: Physician Assistant

## 2016-02-05 VITALS — BP 118/72 | HR 87 | Temp 98.6°F | Resp 16 | Ht 69.0 in | Wt 176.8 lb

## 2016-02-05 DIAGNOSIS — R8299 Other abnormal findings in urine: Secondary | ICD-10-CM | POA: Diagnosis not present

## 2016-02-05 DIAGNOSIS — R82998 Other abnormal findings in urine: Secondary | ICD-10-CM

## 2016-02-05 DIAGNOSIS — R300 Dysuria: Secondary | ICD-10-CM | POA: Diagnosis not present

## 2016-02-05 LAB — POC MICROSCOPIC URINALYSIS (UMFC): MUCUS RE: ABSENT

## 2016-02-05 LAB — POCT URINALYSIS DIP (MANUAL ENTRY)
Bilirubin, UA: NEGATIVE
Glucose, UA: NEGATIVE
Ketones, POC UA: NEGATIVE
Nitrite, UA: NEGATIVE
PH UA: 5.5
UROBILINOGEN UA: 1

## 2016-02-05 MED ORDER — CIPROFLOXACIN HCL 500 MG PO TABS: 500.0000 mg | ORAL_TABLET | Freq: Two times a day (BID) | ORAL | 0 refills | Status: DC

## 2016-02-05 NOTE — Progress Notes (Signed)
Vincent [name]  MRN: [id number] DOB: [birth year]  Subjective:  Pt presents to clinic with dysuria with urination that started a few days ago. Occurs during the entire stream and no trouble getting the stream started or with afterwards dribbling.  Not currently sexually active. He has had no fevers or chills.  He has had some low back pain.  Review of Systems  Constitutional: Positive for fever (low grade). Negative for chills.  HENT: Negative.   Genitourinary: Positive for dysuria, frequency, testicular pain and urgency. Negative for scrotal swelling.    Patient Active Problem List   Diagnosis Date Noted  . Diabetes ([place]) 09/10/2014  . Neck pain on left side 09/08/2013  . History of prostatitis 04/11/2011  . 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 Anemia, unspecified 04/11/2011  . Allergic rhinitis, cause unspecified 02/08/2011  . GERD (gastroesophageal reflux disease) 02/08/2011  . Elevated PSA 02/08/2011  . BPH (benign prostatic hypertrophy) 02/08/2011  . 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 Lumbar disc disease 02/06/2011  . 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 Encounter for well adult exam with abnormal findings 12/19/2010    Current Outpatient Prescriptions on File Prior to Visit  Medication Sig Dispense Refill  . aspirin 81 MG EC tablet Take 1 tablet (81 mg total) by mouth daily. Swallow whole. 30 tablet 12  . finasteride (PROSCAR) 5 MG tablet TK 1 T PO QHS  11  . ibuprofen (ADVIL,MOTRIN) 800 MG tablet TAKE 1 TABLET(800 MG) BY MOUTH EVERY 8 HOURS AS NEEDED FOR PAIN 90 tablet 0  . lansoprazole (PREVACID) 15 MG capsule Take 15 mg by mouth daily as needed.     . metFORMIN (GLUCOPHAGE-XR) 500 MG 24 hr tablet Take 1 tablet (500 mg total) by mouth daily with breakfast. 90 tablet 3  . rosuvastatin (CRESTOR) 10 MG tablet Take 1 tablet (10 mg total) by mouth daily. 90 tablet 3  . tamsulosin (FLOMAX) 0.4 MG CAPS capsule TK ONE C PO  QHS   8   Current Facility-Administered Medications on File Prior to Visit  Medication Dose Route Frequency Provider Last Rate Last Dose  . 0.9 %  sodium chloride infusion  500 mL Intravenous Continuous Manus Gunning, MD      . dextrose 5 % solution   Intravenous Continuous Manus Gunning, MD        No Known Allergies  Pt patients past, family and social history were reviewed and updated.   Objective:  BP 118/72   Pulse 87   Temp 98.6 F (37 C) (Oral)   Resp 16   Ht 5\' 9"  (1.753 m)   Wt 176 lb 12.8 oz (80.2 kg)   SpO2 98%   BMI 26.11 kg/m   Physical Exam  Constitutional: He is oriented to person, place, and time and well-developed, well-nourished, and in no distress.  HENT:  Head: Normocephalic and atraumatic.  Right Ear: External ear normal.  Left Ear: External ear normal.  Eyes: Conjunctivae are normal.  Neck: Normal range of motion.  Cardiovascular: Normal rate, regular rhythm and normal heart sounds.   No murmur heard. Pulmonary/Chest: Effort normal and breath sounds normal. He has no wheezes.  Abdominal: Soft. Bowel sounds are normal. There is no tenderness. There is no CVA tenderness.  Neurological: He is alert and oriented to person, place, and time. Gait normal.  Skin: Skin is warm and dry.  Psychiatric: Mood, memory, affect and judgment normal.    Results for orders placed  or performed in visit on 02/05/16  POCT Microscopic Urinalysis ([facility])  Result Value Ref Range   WBC,UR,HPF,POC Too numerous to count  (A) None WBC/hpf   RBC,UR,HPF,POC Few (A) None RBC/hpf   Bacteria Many (A) None, Too numerous to count   Mucus Absent Absent   Epithelial Cells, UR Per Microscopy Few (A) None, Too numerous to count cells/hpf  POCT urinalysis dipstick  Result Value Ref Range   Color, UA yellow yellow   Clarity, UA clear clear   Glucose, UA negative negative   Bilirubin, UA negative negative   Ketones, POC UA negative negative   Spec Grav, UA >=1.030     Blood, UA large (A) negative   pH, UA 5.5    Protein Ur, POC =100 (A) negative   Urobilinogen, UA 1.0    Nitrite, UA Negative Negative   Leukocytes, UA large (3+) (A) Negative    Assessment and Plan :  Dysuria - Plan: POCT Microscopic Urinalysis ([facility]), POCT urinalysis dipstick  Urine white blood cells increased - Plan: Urine culture   Likely UTI - start cipro and pt will increase his fluids - we will wait for urine culture and we may need to extend his abx depending on his status at that time.  Windell Hummingbird PA-C  [facility] 02/05/2016 12:19 PM

## 2016-02-05 NOTE — Patient Instructions (Addendum)
  You have a UTI that we are treating today. Please take all your abx and we will call with your lab results.   IF you received an x-ray today, you will receive an invoice from [facility]. Please contact [facility] at [phone] with questions or concerns regarding your invoice.   IF you received labwork today, you will receive an invoice from [facility]. Please contact [facility] at [phone] with questions or concerns regarding your invoice.   Our billing staff will not be able to assist you with questions regarding bills from these companies.  You will be contacted with the lab results as soon as they are available. The fastest way to get your results is to activate your My Chart account. Instructions are located on the last page of this paperwork. If you have not heard from [place] regarding the results in 2 weeks, please contact this office.

## 2016-02-07 LAB — URINE CULTURE

## 2016-05-20 ENCOUNTER — Ambulatory Visit (INDEPENDENT_AMBULATORY_CARE_PROVIDER_SITE_OTHER): Payer: Medicare Other | Admitting: Emergency Medicine

## 2016-05-20 VITALS — BP 144/85 | HR 64 | Temp 97.9°F | Ht 68.5 in | Wt 176.0 lb

## 2016-05-20 DIAGNOSIS — N390 Urinary tract infection, site not specified: Secondary | ICD-10-CM | POA: Insufficient documentation

## 2016-05-20 DIAGNOSIS — R300 Dysuria: Secondary | ICD-10-CM

## 2016-05-20 DIAGNOSIS — R350 Frequency of micturition: Secondary | ICD-10-CM | POA: Diagnosis not present

## 2016-05-20 LAB — POCT URINALYSIS DIPSTICK
Bilirubin, UA: NEGATIVE
GLUCOSE UA: NEGATIVE
Ketones, UA: NEGATIVE
NITRITE UA: POSITIVE
PROTEIN UA: 30
Spec Grav, UA: 1.025 (ref 1.010–1.025)
UROBILINOGEN UA: 0.2 U/dL
pH, UA: 5 (ref 5.0–8.0)

## 2016-05-20 MED ORDER — CIPROFLOXACIN HCL 500 MG PO TABS: 500.0000 mg | ORAL_TABLET | Freq: Two times a day (BID) | ORAL | 0 refills | Status: AC

## 2016-05-20 NOTE — Progress Notes (Signed)
Vincent [name] 71 y.o.   Chief Complaint  Patient presents with  . Urinary Tract Infection    x 1 day  . urinary burning  . Urinary Frequency    HISTORY OF PRESENT ILLNESS: This is a 71 y.o. male complaining of UTI symptoms since yesterday; saw Urologist 3 days ago and everything was OK.  Urinary Tract Infection   This is a new problem. The current episode started yesterday. The problem occurs every urination. The problem has been gradually worsening. The quality of the pain is described as burning. The pain is at a severity of 3/10. The pain is mild. There has been no fever. There is no history of pyelonephritis. Associated symptoms include frequency and urgency. Pertinent negatives include no chills, flank pain, hematuria, nausea or vomiting. He has tried nothing for the symptoms.     Prior to Admission medications   Medication Sig Start Date End Date Taking? Authorizing Provider  aspirin 81 MG EC tablet Take 1 tablet (81 mg total) by mouth daily. Swallow whole. 08/01/12  Yes Biagio Borg, MD  finasteride (PROSCAR) 5 MG tablet TK 1 T PO QHS 05/13/15  Yes Historical Provider, MD  ibuprofen (ADVIL,MOTRIN) 800 MG tablet TAKE 1 TABLET(800 MG) BY MOUTH EVERY 8 HOURS AS NEEDED FOR PAIN 01/29/16  Yes Biagio Borg, MD  lansoprazole (PREVACID) 15 MG capsule Take 15 mg by mouth daily as needed.    Yes Historical Provider, MD  tamsulosin (FLOMAX) 0.4 MG CAPS capsule TK ONE C PO  QHS 07/12/14  Yes Historical Provider, MD  ciprofloxacin (CIPRO) 500 MG tablet Take 1 tablet (500 mg total) by mouth 2 (two) times daily. 05/20/16 05/27/16  Horald Pollen, MD  metFORMIN (GLUCOPHAGE-XR) 500 MG 24 hr tablet Take 1 tablet (500 mg total) by mouth daily with breakfast. Patient not taking: Reported on 05/20/2016 09/19/15   Biagio Borg, MD  rosuvastatin (CRESTOR) 10 MG tablet Take 1 tablet (10 mg total) by mouth daily. Patient taking differently: Take 10 mg by mouth 3 (three) times a week.  09/19/15    Biagio Borg, MD    No Known Allergies  Patient Active Problem List   Diagnosis Date Noted  . Diabetes ([place]) 09/10/2014  . Neck pain on left side 09/08/2013  . History of prostatitis 04/11/2011  . 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 Anemia, unspecified 04/11/2011  . Allergic rhinitis, cause unspecified 02/08/2011  . GERD (gastroesophageal reflux disease) 02/08/2011  . Elevated PSA 02/08/2011  . BPH (benign prostatic hypertrophy) 02/08/2011  . 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 Lumbar disc disease 02/06/2011  . 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 Encounter for well adult exam with abnormal findings 12/19/2010    Past Medical History:  Diagnosis Date  . Allergic rhinitis, cause unspecified 02/08/2011  . Anemia, unspecified 04/11/2011  . 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 BPH (benign prostatic hyperplasia)   . BPH (benign prostatic hypertrophy) 02/08/2011  . Cataract    as a child, blind since 89 or 40 yrs old  . 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 Diabetes ([place])   . Elevated PSA 02/08/2011  . GERD (gastroesophageal reflux disease) 02/08/2011  . 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 History of prostatitis 04/11/2011  . Hyperlipidemia   . Kidney stone    passed stone - no surgery required  . Lumbar disc disease 02/06/2011    Past Surgical History:  Procedure Laterality Date  .  BACK SURGERY     x 3  L4-L5  . COLONOSCOPY  2007   Dr Wynetta Emery  . CYST EXCISION    . WISDOM TOOTH EXTRACTION      Social History   Social History  . Marital status: Married    Spouse name: N/A  . Number of children: N/A  . Years of education: N/A   Occupational History  . Not on file.   Social History Main Topics  . Smoking status: Former Smoker    Types: Cigarettes    Quit date: 05/21/1963  . Smokeless tobacco: Never Used  . Alcohol use No  . Drug use: No  . Sexual activity: Not on file   Other Topics Concern    . Not on file   Social History Narrative  . No narrative on file    Family History  Problem Relation Age of Onset  . Blindness Other   . Heart disease Other   . Hypertension Other   . Diabetes Other   . Mental illness Other   . Sudden death Other   . Colon cancer Neg Hx   . Esophageal cancer Neg Hx   . Stomach cancer Neg Hx   . Rectal cancer Neg Hx      Review of Systems  Constitutional: Negative.  Negative for chills and fever.  HENT: Negative.  Negative for sore throat.   Respiratory: Negative.  Negative for shortness of breath.   Cardiovascular: Negative for chest pain and leg swelling.  Gastrointestinal: Negative for abdominal pain, diarrhea, nausea and vomiting.  Genitourinary: Positive for dysuria, frequency and urgency. Negative for flank pain and hematuria.  Musculoskeletal: Negative.   Skin: Negative.  Negative for rash.  Neurological: Negative.   Endo/Heme/Allergies: Negative.   All other systems reviewed and are negative.   Vitals:   05/20/16 0841 05/20/16 0900  BP: (!) 146/89 (!) 144/85  Pulse: 64   Temp: 97.9 F (36.6 C)     Physical Exam  Constitutional: He is oriented to person, place, and time. He appears well-developed and well-nourished.  HENT:  Head: Normocephalic and atraumatic.  Nose: Nose normal.  Eyes: EOM are normal. Pupils are equal, round, and reactive to light.  Neck: Normal range of motion. Neck supple. No JVD present.  Cardiovascular: Normal rate, regular rhythm and normal heart sounds.   Pulmonary/Chest: Effort normal and breath sounds normal.  Abdominal: Soft. Bowel sounds are normal. He exhibits no distension. There is no tenderness.  Musculoskeletal: Normal range of motion.  Lymphadenopathy:    He has no cervical adenopathy.  Neurological: He is alert and oriented to person, place, and time. No sensory deficit. He exhibits normal muscle tone.  Skin: Skin is warm and dry. Capillary refill takes less than 2 seconds.   Psychiatric: He has a normal mood and affect. His behavior is normal.  Vitals reviewed.   Results for orders placed or performed in visit on 05/20/16 (from the past 24 hour(s))  POCT urinalysis dipstick     Status: Abnormal   Collection Time: 05/20/16  9:11 AM  Result Value Ref Range   Color, UA brown    Clarity, UA cloudy    Glucose, UA negative    Bilirubin, UA negative    Ketones, UA negative    Spec Grav, UA 1.025 1.010 - 1.025   Blood, UA large    pH, UA 5.0 5.0 - 8.0   Protein, UA 30    Urobilinogen, UA 0.2 0.2 or 1.0 E.U./dL  Nitrite, UA positive    Leukocytes, UA Large (3+) (A) Negative    ASSESSMENT & PLAN: Undra was seen today for urinary tract infection, urinary burning and urinary frequency.  Diagnoses and all orders for this visit:  Acute UTI  Frequency of urination -     POCT urinalysis dipstick -     Microalbumin, urine -     Urine culture  Dysuria -     POCT urinalysis dipstick -     Microalbumin, urine -     Urine culture  Other orders -     ciprofloxacin (CIPRO) 500 MG tablet; Take 1 tablet (500 mg total) by mouth 2 (two) times daily.     Patient Instructions       IF you received an x-ray today, you will receive an invoice from [facility]. Please contact [facility] at [phone] with questions or concerns regarding your invoice.   IF you received labwork today, you will receive an invoice from [facility]. Please contact [facility] at [phone] with questions or concerns regarding your invoice.   Our billing staff will not be able to assist you with questions regarding bills from these companies.  You will be contacted with the lab results as soon as they are available. The fastest way to get your results is to activate your My Chart account. Instructions are located on the last page of this paperwork. If you have not heard from [place] regarding the results in 2 weeks, please contact this office.     Urinary  Tract Infection, Adult A urinary tract infection (UTI) is an infection of any part of the urinary tract. The urinary tract includes the:  Kidneys.  Ureters.  Bladder.  Urethra. These organs make, store, and get rid of pee (urine) in the body. Follow these instructions at home:  Take over-the-counter and prescription medicines only as told by your doctor.  If you were prescribed an antibiotic medicine, take it as told by your doctor. Do not stop taking the antibiotic even if you start to feel better.  Avoid the following drinks:  Alcohol.  Caffeine.  Tea.  Carbonated drinks.  Drink enough fluid to keep your pee clear or pale yellow.  Keep all follow-up visits as told by your doctor. This is important.  Make sure to:  Empty your bladder often and completely. Do not to hold pee for long periods of time.  Empty your bladder before and after sex.  Wipe from front to back after a bowel movement if you are male. Use each tissue one time when you wipe. Contact a doctor if:  You have back pain.  You have a fever.  You feel sick to your stomach (nauseous).  You throw up (vomit).  Your symptoms do not get better after 3 days.  Your symptoms go away and then come back. Get help right away if:  You have very bad back pain.  You have very bad lower belly (abdominal) pain.  You are throwing up and cannot keep down any medicines or water. This information is not intended to replace advice given to you by your health care provider. Make sure you discuss any questions you have with your health care provider. Document Released: 07/07/2007 Document Revised: 06/26/2015 Document Reviewed: 12/09/2014 Elsevier Interactive Patient Education  2017 Elsevier Inc.      Agustina Caroli, MD [facility]

## 2016-05-20 NOTE — Patient Instructions (Addendum)
     IF you received an x-ray today, you will receive an invoice from [facility]. Please contact [facility] at [phone] with questions or concerns regarding your invoice.   IF you received labwork today, you will receive an invoice from [facility]. Please contact [facility] at [phone] with questions or concerns regarding your invoice.   Our billing staff will not be able to assist you with questions regarding bills from these companies.  You will be contacted with the lab results as soon as they are available. The fastest way to get your results is to activate your My Chart account. Instructions are located on the last page of this paperwork. If you have not heard from us regarding the results in 2 weeks, please contact this office.      Urinary Tract Infection, Adult A urinary tract infection (UTI) is an infection of any part of the urinary tract. The urinary tract includes the:  Kidneys.  Ureters.  Bladder.  Urethra. These organs make, store, and get rid of pee (urine) in the body. Follow these instructions at home:  Take over-the-counter and prescription medicines only as told by your doctor.  If you were prescribed an antibiotic medicine, take it as told by your doctor. Do not stop taking the antibiotic even if you start to feel better.  Avoid the following drinks:  Alcohol.  Caffeine.  Tea.  Carbonated drinks.  Drink enough fluid to keep your pee clear or pale yellow.  Keep all follow-up visits as told by your doctor. This is important.  Make sure to:  Empty your bladder often and completely. Do not to hold pee for long periods of time.  Empty your bladder before and after sex.  Wipe from front to back after a bowel movement if you are male. Use each tissue one time when you wipe. Contact a doctor if:  You have back pain.  You have a fever.  You feel sick to your stomach (nauseous).  You throw up (vomit).  Your symptoms do  not get better after 3 days.  Your symptoms go away and then come back. Get help right away if:  You have very bad back pain.  You have very bad lower belly (abdominal) pain.  You are throwing up and cannot keep down any medicines or water. This information is not intended to replace advice given to you by your health care provider. Make sure you discuss any questions you have with your health care provider. Document Released: 07/07/2007 Document Revised: 06/26/2015 Document Reviewed: 12/09/2014 Elsevier Interactive Patient Education  2017 Elsevier Inc.  

## 2016-05-21 LAB — MICROALBUMIN, URINE: Microalbumin, Urine: 112.5 ug/mL

## 2016-05-22 LAB — URINE CULTURE

## 2016-08-02 ENCOUNTER — Telehealth: Payer: Self-pay

## 2016-08-02 NOTE — Telephone Encounter (Signed)
Pt is due for an appt.  

## 2016-08-05 NOTE — Telephone Encounter (Signed)
Pt has been scheduled for annual exam.

## 2016-09-21 ENCOUNTER — Encounter: Payer: Self-pay | Admitting: Internal Medicine

## 2016-09-21 ENCOUNTER — Other Ambulatory Visit (INDEPENDENT_AMBULATORY_CARE_PROVIDER_SITE_OTHER): Payer: Medicare Other

## 2016-09-21 ENCOUNTER — Ambulatory Visit (INDEPENDENT_AMBULATORY_CARE_PROVIDER_SITE_OTHER): Payer: Medicare Other | Admitting: Internal Medicine

## 2016-09-21 VITALS — BP 128/86 | HR 60 | Temp 98.0°F | Ht 68.5 in | Wt 180.0 lb

## 2016-09-21 DIAGNOSIS — E785 Hyperlipidemia, unspecified: Secondary | ICD-10-CM | POA: Diagnosis not present

## 2016-09-21 DIAGNOSIS — Z0000 Encounter for general adult medical examination without abnormal findings: Secondary | ICD-10-CM

## 2016-09-21 DIAGNOSIS — E119 Type 2 diabetes mellitus without complications: Secondary | ICD-10-CM

## 2016-09-21 LAB — HEPATIC FUNCTION PANEL
ALT: 11 U/L (ref 0–53)
AST: 16 U/L (ref 0–37)
Albumin: 3.8 g/dL (ref 3.5–5.2)
Alkaline Phosphatase: 80 U/L (ref 39–117)
BILIRUBIN DIRECT: 0.1 mg/dL (ref 0.0–0.3)
BILIRUBIN TOTAL: 0.5 mg/dL (ref 0.2–1.2)
Total Protein: 7.4 g/dL (ref 6.0–8.3)

## 2016-09-21 LAB — LIPID PANEL
CHOL/HDL RATIO: 3
Cholesterol: 147 mg/dL (ref 0–200)
HDL: 43.2 mg/dL (ref >39.00)
LDL Cholesterol: 91 mg/dL (ref 0–99)
NONHDL: 103.91
Triglycerides: 64 mg/dL (ref 0.0–149.0)
VLDL: 12.8 mg/dL (ref 0.0–40.0)

## 2016-09-21 LAB — URINALYSIS, ROUTINE W REFLEX MICROSCOPIC
Bilirubin Urine: NEGATIVE
Hgb urine dipstick: NEGATIVE
KETONES UR: NEGATIVE
Leukocytes, UA: NEGATIVE
Nitrite: NEGATIVE
PH: 6 (ref 5.0–8.0)
RBC / HPF: NONE SEEN (ref 0–?)
SPECIFIC GRAVITY, URINE: 1.02 (ref 1.000–1.030)
Total Protein, Urine: NEGATIVE
URINE GLUCOSE: NEGATIVE
UROBILINOGEN UA: 0.2 (ref 0.0–1.0)

## 2016-09-21 LAB — CBC WITH DIFFERENTIAL/PLATELET
BASOS PCT: 0.6 % (ref 0.0–3.0)
Basophils Absolute: 0 10*3/uL (ref 0.0–0.1)
EOS ABS: 0.2 10*3/uL (ref 0.0–0.7)
Eosinophils Relative: 4.8 % (ref 0.0–5.0)
HEMATOCRIT: 40.2 % (ref 39.0–52.0)
Hemoglobin: 12.8 g/dL — ABNORMAL LOW (ref 13.0–17.0)
LYMPHS PCT: 35.8 % (ref 12.0–46.0)
Lymphs Abs: 1.8 10*3/uL (ref 0.7–4.0)
MCHC: 31.7 g/dL (ref 30.0–36.0)
MCV: 89.1 fl (ref 78.0–100.0)
Monocytes Absolute: 0.5 10*3/uL (ref 0.1–1.0)
Monocytes Relative: 9.8 % (ref 3.0–12.0)
NEUTROS ABS: 2.4 10*3/uL (ref 1.4–7.7)
Neutrophils Relative %: 49 % (ref 43.0–77.0)
PLATELETS: 181 10*3/uL (ref 150.0–400.0)
RBC: 4.52 Mil/uL (ref 4.22–5.81)
RDW: 13.8 % (ref 11.5–15.5)
WBC: 4.9 10*3/uL (ref 4.0–10.5)

## 2016-09-21 LAB — BASIC METABOLIC PANEL
BUN: 14 mg/dL (ref 6–23)
CHLORIDE: 106 meq/L (ref 96–112)
CO2: 30 mEq/L (ref 19–32)
CREATININE: 0.99 mg/dL (ref 0.40–1.50)
Calcium: 9.1 mg/dL (ref 8.4–10.5)
GFR: 95.93 mL/min (ref >60.00)
Glucose, Bld: 98 mg/dL (ref 70–99)
POTASSIUM: 4 meq/L (ref 3.5–5.1)
SODIUM: 138 meq/L (ref 135–145)

## 2016-09-21 LAB — MICROALBUMIN / CREATININE URINE RATIO
CREATININE, U: 153.4 mg/dL
MICROALB UR: 0.9 mg/dL (ref 0.0–1.9)
MICROALB/CREAT RATIO: 0.6 mg/g (ref 0.0–30.0)

## 2016-09-21 LAB — HEMOGLOBIN A1C: Hgb A1c MFr Bld: 6.7 % — ABNORMAL HIGH (ref 4.6–6.5)

## 2016-09-21 LAB — PSA: PSA: 11.76 ng/mL — ABNORMAL HIGH (ref 0.10–4.00)

## 2016-09-21 LAB — TSH: TSH: 0.9 u[IU]/mL (ref 0.35–4.50)

## 2016-09-21 MED ORDER — IBUPROFEN 800 MG PO TABS: ORAL_TABLET | ORAL | 0 refills | Status: DC

## 2016-09-21 NOTE — Assessment & Plan Note (Signed)

## 2016-09-21 NOTE — Patient Instructions (Addendum)
Please remember to make appt with Dr Gershon Crane for eye exam  Please continue all other medications as before, and refills have been done if requested.  Please have the pharmacy call with any other refills you may need.  Please continue your efforts at being more active, low cholesterol diet, and weight control.  You are otherwise up to date with prevention measures today.  Please keep your appointments with your specialists as you may have planned  Please go to the LAB in the Basement (turn left off the elevator) for the tests to be done today  You will be contacted by phone if any changes need to be made immediately.  Otherwise, you will receive a letter about your results with an explanation, but please check with MyChart first.  Please remember to sign up for MyChart if you have not done so, as this will be important to you in the future with finding out test results, communicating by private email, and scheduling acute appointments online when needed.  Please return in 6 months, or sooner if needed, with Lab testing done 3-5 days before

## 2016-09-21 NOTE — Assessment & Plan Note (Addendum)
Stable to cont diet, not taking the statin, for f/u lab today, ideally goal ldl < 70

## 2016-09-21 NOTE — Progress Notes (Signed)
Subjective:    Patient ID: Vincent [name], male    DOB: 1945/04/23, 71 y.o.   MRN: [id number]  HPI  Here for wellness and f/u;  Overall doing ok;  Pt denies Chest pain, worsening SOB, DOE, wheezing, orthopnea, PND, worsening LE edema, palpitations, dizziness or syncope.  Pt denies neurological change such as new headache, facial or extremity weakness.  Pt denies polydipsia, polyuria, or low sugar symptoms. Pt states overall good compliance with treatment and medications, good tolerability, and has been trying to follow appropriate diet.  Pt denies worsening depressive symptoms, suicidal ideation or panic. No fever, night sweats, wt loss, loss of appetite, or other constitutional symptoms.  Pt states good ability with ADL's, has low fall risk, home safety reviewed and adequate, no other significant changes in hearing or vision, and only occasionally active with exercise. Had low sugar on OHA, so stopped metformin.  Does not take the statin regularly Trying to work on diet  Pt continues to have recurring LBP without change in severity, bowel or bladder change, fever, wt loss,  worsening LE pain/numbness/weakness, gait change or falls. Past Medical History:  Diagnosis Date  . Allergic rhinitis, cause unspecified 02/08/2011  . Anemia, unspecified 04/11/2011  . 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 BPH (benign prostatic hyperplasia)   . BPH (benign prostatic hypertrophy) 02/08/2011  . Cataract    as a child, blind since 9 or 14 yrs old  . 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 Diabetes ([place])   . Elevated PSA 02/08/2011  . GERD (gastroesophageal reflux disease) 02/08/2011  . 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 History of prostatitis 04/11/2011  . Hyperlipidemia   . Kidney stone    passed stone - no surgery required  . Lumbar disc disease 02/06/2011   Past Surgical History:  Procedure Laterality Date  . BACK SURGERY     x 3  L4-L5  . COLONOSCOPY  2007   Dr  Wynetta Emery  . CYST EXCISION    . WISDOM TOOTH EXTRACTION      reports that he quit smoking about 53 years ago. His smoking use included Cigarettes. He has never used smokeless tobacco. He reports that he does not drink alcohol or use drugs. family history includes Blindness in his other; Diabetes in his other; Heart disease in his other; Hypertension in his other; Mental illness in his other; Sudden death in his other. No Known Allergies Current Outpatient Prescriptions on File Prior to Visit  Medication Sig Dispense Refill  . aspirin 81 MG EC tablet Take 1 tablet (81 mg total) by mouth daily. Swallow whole. 30 tablet 12  . finasteride (PROSCAR) 5 MG tablet TK 1 T PO QHS  11  . lansoprazole (PREVACID) 15 MG capsule Take 15 mg by mouth daily as needed.     . rosuvastatin (CRESTOR) 10 MG tablet Take 1 tablet (10 mg total) by mouth daily. (Patient taking differently: Take 10 mg by mouth 3 (three) times a week. ) 90 tablet 3  . tamsulosin (FLOMAX) 0.4 MG CAPS capsule TK ONE C PO  QHS  8   No current facility-administered medications on file prior to visit.     Review of Systems  Constitutional: Negative for other unusual diaphoresis, sweats, appetite or weight changes HENT: Negative for other worsening hearing loss, ear pain, facial swelling, mouth sores or neck stiffness.   Eyes: Negative for other worsening pain, redness or other visual disturbance.  Respiratory: Negative for other stridor  or swelling Cardiovascular: Negative for other palpitations or other chest pain  Gastrointestinal: Negative for worsening diarrhea or loose stools, blood in stool, distention or other pain Genitourinary: Negative for hematuria, flank pain or other change in urine volume.  Musculoskeletal: Negative for myalgias or other joint swelling.  Skin: Negative for other color change, or other wound or worsening drainage.  Neurological: Negative for other syncope or numbness. Hematological: Negative for other  adenopathy or swelling Psychiatric/Behavioral: Negative for hallucinations, other worsening agitation, SI, self-injury, or new decreased concentration All other system neg per pt    Objective:   Physical Exam BP 128/86   Pulse 60   Temp 98 F (36.7 C)   Ht 5' 8.5" (1.74 m)   Wt 180 lb (81.6 kg)   SpO2 98%   BMI 26.97 kg/m  VS noted,  Constitutional: Pt is oriented to person, place, and time. Appears well-developed and well-nourished, in no significant distress and comfortable Head: Normocephalic and atraumatic  Eyes: Conjunctivae and EOM are normal. Pupils are equal, round, and reactive to light Right Ear: External ear normal without discharge Left Ear: External ear normal without discharge Nose: Nose without discharge or deformity Mouth/Throat: Oropharynx is without other ulcerations and moist  Neck: Normal range of motion. Neck supple. No JVD present. No tracheal deviation present or significant neck LA or mass Cardiovascular: Normal rate, regular rhythm, normal heart sounds and intact distal pulses.   Pulmonary/Chest: WOB normal and breath sounds without rales or wheezing  Abdominal: Soft. Bowel sounds are normal. NT. No HSM  Musculoskeletal: Normal range of motion. Exhibits no edema Lymphadenopathy: Has no other cervical adenopathy.  Neurological: Pt is alert and oriented to person, place, and time. Pt has normal reflexes. No cranial nerve deficit. Motor grossly intact, Gait intact Skin: Skin is warm and dry. No rash noted or new ulcerations Psychiatric:  Has normal mood and affect. Behavior is normal without agitation  No other exam findings  Lab Results  Component Value Date   WBC 4.6 09/18/2015   HGB 13.7 09/18/2015   HCT 41.6 09/18/2015   PLT 203.0 09/18/2015   GLUCOSE 111 (H) 09/18/2015   CHOL 178 09/18/2015   TRIG 75.0 09/18/2015   HDL 56.60 09/18/2015   LDLCALC 106 (H) 09/18/2015   ALT 15 09/18/2015   AST 17 09/18/2015   NA 138 09/18/2015   K 4.3 09/18/2015     CL 105 09/18/2015   CREATININE 1.00 09/18/2015   BUN 14 09/18/2015   CO2 29 09/18/2015   TSH 0.66 09/18/2015   PSA 14.74 (H) 09/10/2014   HGBA1C 7.0 (H) 09/18/2015       Assessment & Plan:

## 2016-09-21 NOTE — Assessment & Plan Note (Signed)
Unfortunately had low sugar with metformin so stopped, to cont diet,  to f/u any worsening symptoms or concerns

## 2016-11-01 ENCOUNTER — Other Ambulatory Visit: Payer: Self-pay | Admitting: Internal Medicine

## 2017-03-24 ENCOUNTER — Ambulatory Visit: Payer: Medicare Other | Admitting: Internal Medicine

## 2017-04-19 ENCOUNTER — Ambulatory Visit: Payer: Medicare Other | Admitting: Internal Medicine

## 2017-04-29 ENCOUNTER — Encounter: Payer: Self-pay | Admitting: Internal Medicine

## 2017-04-29 ENCOUNTER — Other Ambulatory Visit (INDEPENDENT_AMBULATORY_CARE_PROVIDER_SITE_OTHER): Payer: Medicare Other

## 2017-04-29 ENCOUNTER — Ambulatory Visit: Payer: Medicare Other | Admitting: Internal Medicine

## 2017-04-29 VITALS — BP 122/84 | HR 86 | Temp 98.0°F | Ht 68.5 in | Wt 184.0 lb

## 2017-04-29 DIAGNOSIS — G8929 Other chronic pain: Secondary | ICD-10-CM | POA: Diagnosis not present

## 2017-04-29 DIAGNOSIS — E119 Type 2 diabetes mellitus without complications: Secondary | ICD-10-CM

## 2017-04-29 DIAGNOSIS — R972 Elevated prostate specific antigen [PSA]: Secondary | ICD-10-CM | POA: Diagnosis not present

## 2017-04-29 DIAGNOSIS — Z0000 Encounter for general adult medical examination without abnormal findings: Secondary | ICD-10-CM

## 2017-04-29 DIAGNOSIS — M545 Low back pain: Secondary | ICD-10-CM | POA: Diagnosis not present

## 2017-04-29 LAB — URINALYSIS, ROUTINE W REFLEX MICROSCOPIC
Bilirubin Urine: NEGATIVE
Hgb urine dipstick: NEGATIVE
KETONES UR: NEGATIVE
Leukocytes, UA: NEGATIVE
Nitrite: NEGATIVE
PH: 6 (ref 5.0–8.0)
RBC / HPF: NONE SEEN (ref 0–?)
Specific Gravity, Urine: 1.025 (ref 1.000–1.030)
TOTAL PROTEIN, URINE-UPE24: NEGATIVE
URINE GLUCOSE: NEGATIVE
UROBILINOGEN UA: 0.2 (ref 0.0–1.0)

## 2017-04-29 LAB — CBC WITH DIFFERENTIAL/PLATELET
BASOS PCT: 1.3 % (ref 0.0–3.0)
Basophils Absolute: 0.1 10*3/uL (ref 0.0–0.1)
EOS ABS: 0.2 10*3/uL (ref 0.0–0.7)
EOS PCT: 3.2 % (ref 0.0–5.0)
HCT: 39.5 % (ref 39.0–52.0)
Hemoglobin: 13 g/dL (ref 13.0–17.0)
Lymphocytes Relative: 35.7 % (ref 12.0–46.0)
Lymphs Abs: 1.7 10*3/uL (ref 0.7–4.0)
MCHC: 32.9 g/dL (ref 30.0–36.0)
MCV: 86.3 fl (ref 78.0–100.0)
Monocytes Absolute: 0.5 10*3/uL (ref 0.1–1.0)
Monocytes Relative: 10 % (ref 3.0–12.0)
NEUTROS ABS: 2.3 10*3/uL (ref 1.4–7.7)
Neutrophils Relative %: 49.8 % (ref 43.0–77.0)
PLATELETS: 181 10*3/uL (ref 150.0–400.0)
RBC: 4.58 Mil/uL (ref 4.22–5.81)
RDW: 13.9 % (ref 11.5–15.5)
WBC: 4.6 10*3/uL (ref 4.0–10.5)

## 2017-04-29 LAB — BASIC METABOLIC PANEL
BUN: 19 mg/dL (ref 6–23)
CHLORIDE: 106 meq/L (ref 96–112)
CO2: 29 mEq/L (ref 19–32)
Calcium: 9.3 mg/dL (ref 8.4–10.5)
Creatinine, Ser: 1.1 mg/dL (ref 0.40–1.50)
GFR: 84.8 mL/min (ref >60.00)
Glucose, Bld: 109 mg/dL — ABNORMAL HIGH (ref 70–99)
POTASSIUM: 4.1 meq/L (ref 3.5–5.1)
SODIUM: 139 meq/L (ref 135–145)

## 2017-04-29 LAB — MICROALBUMIN / CREATININE URINE RATIO
Creatinine,U: 174.1 mg/dL
MICROALB/CREAT RATIO: 0.4 mg/g (ref 0.0–30.0)
Microalb, Ur: <0.7 mg/dL (ref 0.0–1.9)

## 2017-04-29 LAB — LIPID PANEL
Cholesterol: 145 mg/dL (ref 0–200)
HDL: 45.1 mg/dL (ref >39.00)
LDL Cholesterol: 79 mg/dL (ref 0–99)
NONHDL: 99.99
Total CHOL/HDL Ratio: 3
Triglycerides: 103 mg/dL (ref 0.0–149.0)
VLDL: 20.6 mg/dL (ref 0.0–40.0)

## 2017-04-29 LAB — PSA: PSA: 8.5 ng/mL — ABNORMAL HIGH (ref 0.10–4.00)

## 2017-04-29 LAB — HEPATIC FUNCTION PANEL
ALBUMIN: 3.8 g/dL (ref 3.5–5.2)
ALT: 13 U/L (ref 0–53)
AST: 17 U/L (ref 0–37)
Alkaline Phosphatase: 78 U/L (ref 39–117)
BILIRUBIN DIRECT: 0.1 mg/dL (ref 0.0–0.3)
TOTAL PROTEIN: 7.1 g/dL (ref 6.0–8.3)
Total Bilirubin: 0.4 mg/dL (ref 0.2–1.2)

## 2017-04-29 LAB — TSH: TSH: 1.03 u[IU]/mL (ref 0.35–4.50)

## 2017-04-29 LAB — HEMOGLOBIN A1C: HEMOGLOBIN A1C: 6.9 % — AB (ref 4.6–6.5)

## 2017-04-29 MED ORDER — ROSUVASTATIN CALCIUM 10 MG PO TABS: 10.0000 mg | ORAL_TABLET | Freq: Every day | ORAL | 3 refills | Status: DC

## 2017-04-29 MED ORDER — IBUPROFEN 800 MG PO TABS: ORAL_TABLET | ORAL | 3 refills | Status: DC

## 2017-04-29 MED ORDER — FINASTERIDE 5 MG PO TABS: ORAL_TABLET | ORAL | 3 refills | Status: DC

## 2017-04-29 MED ORDER — LANSOPRAZOLE 30 MG PO CPDR: 30.0000 mg | DELAYED_RELEASE_CAPSULE | Freq: Every day | ORAL | 3 refills | Status: DC

## 2017-04-29 MED ORDER — ZOSTER VAC RECOMB ADJUVANTED 50 MCG/0.5ML IM SUSR: 0.5000 mL | Freq: Once | INTRAMUSCULAR | 1 refills | Status: AC

## 2017-04-29 NOTE — Assessment & Plan Note (Signed)

## 2017-04-29 NOTE — Assessment & Plan Note (Signed)
stable overall by history and exam, recent data reviewed with pt, and pt to continue medical treatment as before,  to f/u any worsening symptoms or concerns  

## 2017-04-29 NOTE — Patient Instructions (Addendum)
Please remember to make your yearly eye doctor appt  Your shingles shot prescription was sent to the pharmacy  Please continue all other medications as before, and refills have been done if requested.  Please have the pharmacy call with any other refills you may need.  Please continue your efforts at being more active, low cholesterol diet, and weight control.  You are otherwise up to date with prevention measures today.  Please keep your appointments with your specialists as you may have planned  Please go to the LAB in the Basement (turn left off the elevator) for the tests to be done today  You will be contacted by phone if any changes need to be made immediately.  Otherwise, you will receive a letter about your results with an explanation, but please check with MyChart first.  Please remember to sign up for MyChart if you have not done so, as this will be important to you in the future with finding out test results, communicating by private email, and scheduling acute appointments online when needed.  Please return in 6 months, or sooner if needed

## 2017-04-29 NOTE — Assessment & Plan Note (Signed)
Sees Dr Nesi/urology every 6 mo, s/p prostate bx x 3

## 2017-04-29 NOTE — Assessment & Plan Note (Signed)
To continue nsaid with prevacid ASD,  to f/u any worsening symptoms or concerns

## 2017-04-29 NOTE — Progress Notes (Signed)
Subjective:    Patient ID: Vincent [name], male    DOB: 1945-03-16, 72 y.o.   MRN: [id number]  HPI  Here for wellness and f/u;  Overall doing ok;  Pt denies Chest pain, worsening SOB, DOE, wheezing, orthopnea, PND, worsening LE edema, palpitations, dizziness or syncope.  Pt denies neurological change such as new headache, facial or extremity weakness.  Pt denies polydipsia, polyuria, or low sugar symptoms. Pt states overall good compliance with treatment and medications, good tolerability, and has been trying to follow appropriate diet.  Pt denies worsening depressive symptoms, suicidal ideation or panic. No fever, night sweats, wt loss, loss of appetite, or other constitutional symptoms.  Pt states good ability with ADL's, has low fall risk, home safety reviewed and adequate, no other significant changes in hearing or vision, and only occasionally active with exercise.  Due for optho yearly visit, and shingles shot. Pt continues to have recurring LBP without change in severity, bowel or bladder change, fever, wt loss,  worsening LE pain/numbness/weakness, gait change or falls. No other new complaints or interval change Past Medical History:  Diagnosis Date  . Allergic rhinitis, cause unspecified 02/08/2011  . Anemia, unspecified 04/11/2011  . 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 BPH (benign prostatic hyperplasia)   . BPH (benign prostatic hypertrophy) 02/08/2011  . Cataract    as a child, blind since 40 or 65 yrs old  . 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 Diabetes ([place])   . Elevated PSA 02/08/2011  . GERD (gastroesophageal reflux disease) 02/08/2011  . 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 History of prostatitis 04/11/2011  . Hyperlipidemia   . Kidney stone    passed stone - no surgery required  . Lumbar disc disease 02/06/2011   Past Surgical History:  Procedure Laterality Date  . BACK SURGERY     x 3  L4-L5  . COLONOSCOPY  2007   Dr Wynetta Emery  . CYST  EXCISION    . WISDOM TOOTH EXTRACTION      reports that he quit smoking about 53 years ago. His smoking use included cigarettes. He has never used smokeless tobacco. He reports that he does not drink alcohol or use drugs. family history includes Blindness in his other; Diabetes in his other; Heart disease in his other; Hypertension in his other; Mental illness in his other; Sudden death in his other. No Known Allergies Current Outpatient Medications on File Prior to Visit  Medication Sig Dispense Refill  . aspirin 81 MG EC tablet Take 1 tablet (81 mg total) by mouth daily. Swallow whole. 30 tablet 12  . tamsulosin (FLOMAX) 0.4 MG CAPS capsule TK ONE C PO  QHS  8   No current facility-administered medications on file prior to visit.    Review of Systems Constitutional: Negative for other unusual diaphoresis, sweats, appetite or weight changes HENT: Negative for other worsening hearing loss, ear pain, facial swelling, mouth sores or neck stiffness.   Eyes: Negative for other worsening pain, redness or other visual disturbance.  Respiratory: Negative for other stridor or swelling Cardiovascular: Negative for other palpitations or other chest pain  Gastrointestinal: Negative for worsening diarrhea or loose stools, blood in stool, distention or other pain Genitourinary: Negative for hematuria, flank pain or other change in urine volume.  Musculoskeletal: Negative for myalgias or other joint swelling.  Skin: Negative for other color change, or other wound or worsening drainage.  Neurological: Negative for other syncope or numbness. Hematological: Negative for  other adenopathy or swelling Psychiatric/Behavioral: Negative for hallucinations, other worsening agitation, SI, self-injury, or new decreased concentration All other system neg per pt    Objective:   Physical Exam BP 122/84   Pulse 86   Temp 98 F (36.7 C) (Oral)   Ht 5' 8.5" (1.74 m)   Wt 184 lb (83.5 kg)   SpO2 96%   BMI 27.57  kg/m  VS noted,  Constitutional: Pt is oriented to person, place, and time. Appears well-developed and well-nourished, in no significant distress and comfortable Head: Normocephalic and atraumatic  Eyes: Conjunctivae and EOM are normal. Pupils are equal, round, and reactive to light Right Ear: External ear normal without discharge Left Ear: External ear normal without discharge Nose: Nose without discharge or deformity Mouth/Throat: Oropharynx is without other ulcerations and moist  Neck: Normal range of motion. Neck supple. No JVD present. No tracheal deviation present or significant neck LA or mass Cardiovascular: Normal rate, regular rhythm, normal heart sounds and intact distal pulses.   Pulmonary/Chest: WOB normal and breath sounds without rales or wheezing  Abdominal: Soft. Bowel sounds are normal. NT. No HSM  Musculoskeletal: Normal range of motion. Exhibits no edema Lymphadenopathy: Has no other cervical adenopathy.  Neurological: Pt is alert and oriented to person, place, and time. Pt has normal reflexes. No cranial nerve deficit. Motor grossly intact, Gait intact Skin: Skin is warm and dry. No rash noted or new ulcerations Psychiatric:  Has normal mood and affect. Behavior is normal without agitation No other exam findings Lab Results  Component Value Date   WBC 4.6 04/29/2017   HGB 13.0 04/29/2017   HCT 39.5 04/29/2017   PLT 181.0 04/29/2017   GLUCOSE 109 (H) 04/29/2017   CHOL 145 04/29/2017   TRIG 103.0 04/29/2017   HDL 45.10 04/29/2017   LDLCALC 79 04/29/2017   ALT 13 04/29/2017   AST 17 04/29/2017   NA 139 04/29/2017   K 4.1 04/29/2017   CL 106 04/29/2017   CREATININE 1.10 04/29/2017   BUN 19 04/29/2017   CO2 29 04/29/2017   TSH 1.03 04/29/2017   PSA 8.50 (H) 04/29/2017   HGBA1C 6.9 (H) 04/29/2017   MICROALBUR <0.7 04/29/2017       Assessment & Plan:

## 2017-09-12 ENCOUNTER — Other Ambulatory Visit: Payer: Self-pay

## 2017-09-12 ENCOUNTER — Other Ambulatory Visit: Payer: Self-pay | Admitting: Physician Assistant

## 2017-09-12 ENCOUNTER — Encounter: Payer: Self-pay | Admitting: Physician Assistant

## 2017-09-12 ENCOUNTER — Ambulatory Visit (INDEPENDENT_AMBULATORY_CARE_PROVIDER_SITE_OTHER): Payer: Medicare Other

## 2017-09-12 ENCOUNTER — Ambulatory Visit: Payer: Medicare Other | Admitting: Physician Assistant

## 2017-09-12 VITALS — BP 152/90 | HR 81 | Temp 97.8°F | Resp 18 | Ht 68.5 in | Wt 187.4 lb

## 2017-09-12 DIAGNOSIS — S66911A Strain of unspecified muscle, fascia and tendon at wrist and hand level, right hand, initial encounter: Secondary | ICD-10-CM

## 2017-09-12 MED ORDER — MELOXICAM 7.5 MG PO TABS: 7.5000 mg | ORAL_TABLET | Freq: Every day | ORAL | 0 refills | Status: DC

## 2017-09-12 NOTE — Progress Notes (Signed)
Vincent [name]  MRN: [id number] DOB: 03-03-1945  PCP: Biagio Borg, MD  Chief Complaint  Patient presents with  . Wrist Pain    right wrist swollen x couple weeks ago     Subjective:  Pt presents to clinic for right wrist pain.  Certain movements makes the pain worse like bend. He has had no injury to his wrist that he knows of.  Since the start of the pain he feels like it has gotten worse.  He has used motrin but he is not sure that it is helping any more.  He also feels like his wrist is swollen. He has no h/o past injuries or pain in this wrist.  He is twisting and pinching for his job.  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  Right handed  History is obtained by patient.  Review of Systems  Constitutional: Negative for chills and fever.  Musculoskeletal: Positive for arthralgias (right wrist).    Patient Active Problem List   Diagnosis Date Noted  . Hyperlipidemia 09/21/2016  . Diabetes ([place]) 09/10/2014  . Neck pain on left side 09/08/2013  . History of prostatitis 04/11/2011  . 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 Anemia, unspecified 04/11/2011  . Allergic rhinitis, cause unspecified 02/08/2011  . GERD (gastroesophageal reflux disease) 02/08/2011  . Elevated PSA 02/08/2011  . BPH (benign prostatic hypertrophy) 02/08/2011  . Chronic low back pain 02/08/2011  . Lumbar disc disease 02/06/2011  . 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 Preventative health care 12/19/2010    Current Outpatient Medications on File Prior to Visit  Medication Sig Dispense Refill  . aspirin 81 MG EC tablet Take 1 tablet (81 mg total) by mouth daily. Swallow whole. 30 tablet 12  . finasteride (PROSCAR) 5 MG tablet TK 1 T PO QHS 90 tablet 3  . ibuprofen (ADVIL,MOTRIN) 800 MG tablet TAKE 1 TABLET(800 MG) BY MOUTH EVERY 8 HOURS AS NEEDED FOR PAIN 90 tablet 3  . lansoprazole (PREVACID) 30 MG capsule  Take 1 capsule (30 mg total) by mouth daily. 90 capsule 3  . rosuvastatin (CRESTOR) 10 MG tablet Take 1 tablet (10 mg total) by mouth daily. 90 tablet 3  . tamsulosin (FLOMAX) 0.4 MG CAPS capsule TK ONE C PO  QHS  8   No current facility-administered medications on file prior to visit.     No Known Allergies  Past Medical History:  Diagnosis Date  . Allergic rhinitis, cause unspecified 02/08/2011  . Anemia, unspecified 04/11/2011  . 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 BPH (benign prostatic hyperplasia)   . BPH (benign prostatic hypertrophy) 02/08/2011  . Cataract    as a child, blind since 31 or 44 yrs old  . 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 Diabetes ([place])   . Elevated PSA 02/08/2011  . GERD (gastroesophageal reflux disease) 02/08/2011  . 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 History of prostatitis 04/11/2011  . Hyperlipidemia   . Kidney stone    passed stone - no surgery required  . Lumbar disc disease 02/06/2011   Social History   Social History Narrative  . Not on file   Social History   Tobacco Use  . Smoking status: Former Smoker    Types: Cigarettes    Last attempt to quit: 05/21/1963    Years since quitting: 54.3  . Smokeless tobacco: Never Used  Substance Use Topics  .  Alcohol use: No    Alcohol/week: 0.0 standard drinks  . Drug use: No   family history includes Blindness in his other; Diabetes in his other; Heart disease in his other; Hypertension in his other; Mental illness in his other; Sudden death in his other.     Objective:  BP (!) 152/90   Pulse 81   Temp 97.8 F (36.6 C) (Oral)   Resp 18   Ht 5' 8.5" (1.74 m)   Wt 187 lb 6.4 oz (85 kg)   SpO2 97%   BMI 28.08 kg/m  Body mass index is 28.08 kg/m.  Wt Readings from Last 3 Encounters:  09/12/17 187 lb 6.4 oz (85 kg)  04/29/17 184 lb (83.5 kg)  09/21/16 180 lb (81.6 kg)    Physical Exam  Constitutional: He is oriented to person, place, and time. He appears  well-developed and well-nourished.  HENT:  Head: Normocephalic and atraumatic.  Right Ear: External ear normal.  Left Ear: External ear normal.  Mouth/Throat: Oropharynx is clear and moist.  Eyes: Conjunctivae are normal.  Neck: Normal range of motion.  Pulmonary/Chest: Effort normal.  Musculoskeletal:       Right wrist: He exhibits tenderness (radial aspect of wrist and into thumb extensore tendon) and swelling (maybe mild). He exhibits normal range of motion.       Left wrist: Normal.  Pain with extension of wrist that worsens with resistance - no pain with flexion - pain with thumb extension but no flexion.  Mod positive finkelsteins.  Neurological: He is alert and oriented to person, place, and time.  Skin: Skin is warm and dry.  Psychiatric: Judgment normal.  Vitals reviewed.  Dg Wrist Complete Right  Result Date: 09/12/2017 CLINICAL DATA:  Pain involving the radial aspect of the RIGHT wrist. No known injuries. EXAM: RIGHT WRIST - COMPLETE 3+ VIEW COMPARISON:  None. FINDINGS: No evidence of acute fracture or dislocation. Joint spaces well preserved. Well-preserved bone mineral density. No intrinsic osseous abnormalities. IMPRESSION: Normal examination. Electronically Signed   By: Evangeline Dakin M.D.   On: 09/12/2017 16:06    Assessment and Plan :  Strain of right wrist, initial encounter - Plan: DG Wrist Complete Right, meloxicam (MOBIC) 7.5 MG tablet - radial wrist sprain likely from repetitive job of twisting.  Ice massage and NSAIDs and rest with slpint - if no better and neg xray consider sport medicine referral in 10-14d for possible injection.  Patient verbalized to me that they understand the following: diagnosis, what is being done for them, what to expect and what should be done at home.  Their questions have been answered.  See after visit summary for patient specific instructions.  Windell Hummingbird PA-C  [facility] 09/12/2017 3:14  PM  Please note: Portions of this report may have been transcribed using dragon voice recognition software. Every effort was made to ensure accuracy; however, inadvertent computerized transcription errors may be present.

## 2017-09-12 NOTE — Patient Instructions (Addendum)
Use medication as needed - ice massage with a paper cup will help the inflammation more than just an ice pack.  Wear brace as you can - the more your wear it the quicker you will get better.    IF you received an x-ray today, you will receive an invoice from [facility]. Please contact [facility] at [phone] with questions or concerns regarding your invoice.   IF you received labwork today, you will receive an invoice from [place]. Please contact [facility] at [phone] with questions or concerns regarding your invoice.   Our billing staff will not be able to assist you with questions regarding bills from these companies.  You will be contacted with the lab results as soon as they are available. The fastest way to get your results is to activate your My Chart account. Instructions are located on the last page of this paperwork. If you have not heard from [place] regarding the results in 2 weeks, please contact this office.

## 2017-09-13 ENCOUNTER — Encounter: Payer: Self-pay | Admitting: Physician Assistant

## 2017-10-27 NOTE — Progress Notes (Addendum)
Subjective:   Vincent [name] is a 72 y.o. male who presents for an Initial Medicare Annual Wellness Visit.  Review of Systems  No ROS.  Medicare Wellness Visit. Additional risk factors are reflected in the social history. Cardiac Risk Factors include: advanced age (>69men, >31 women);diabetes mellitus;dyslipidemia;male gender Sleep patterns: does not get up to void, gets up 1-2 times nightly to void and sleeps 4-5 hours nightly.  Patient reports insomnia issues, discussed recommended sleep tips.  Home Safety/Smoke Alarms: Feels safe in home. Smoke alarms in place.  Living environment; residence and Firearm Safety: 1-story house/ trailer, no firearms. Lives with wife, no needs for DME, good support system Seat Belt Safety/Bike Helmet: Wears seat belt.   PSA-  Lab Results  Component Value Date   PSA 8.50 (H) 04/29/2017   PSA 11.76 (H) 09/21/2016   PSA 14.74 (H) 09/10/2014      Objective:    Today's Vitals   10/28/17 0917  BP: 138/76  Pulse: 86  SpO2: 99%  Weight: 182 lb (82.6 kg)  Height: 5\' 8"  (1.727 m)   Body mass index is 27.67 kg/m.  Advanced Directives 10/28/2017 12/11/2015 11/27/2015 08/07/2015  Does Patient Have a Medical Advance Directive? Yes Yes Yes Yes  Type of Paramedic of [place];Living will - Living will;Healthcare Power of [place];Living will  Copy of [place] in Chart? - No - copy requested - -    Current Medications (verified) Outpatient Encounter Medications as of 10/28/2017  Medication Sig  . aspirin 81 MG EC tablet Take 1 tablet (81 mg total) by mouth daily. Swallow whole.  . finasteride (PROSCAR) 5 MG tablet TK 1 T PO QHS  . ibuprofen (ADVIL,MOTRIN) 800 MG tablet TAKE 1 TABLET(800 MG) BY MOUTH EVERY 8 HOURS AS NEEDED FOR PAIN  . lansoprazole (PREVACID) 30 MG capsule Take 1 capsule (30 mg total) by mouth daily.  . meloxicam (MOBIC) 7.5 MG tablet Take 1-2 tablets (7.5-15  mg total) by mouth daily.  . rosuvastatin (CRESTOR) 10 MG tablet Take 1 tablet (10 mg total) by mouth daily.  . tamsulosin (FLOMAX) 0.4 MG CAPS capsule TK ONE C PO  QHS   No facility-administered encounter medications on file as of 10/28/2017.     Allergies (verified) Patient has no known allergies.   History: Past Medical History:  Diagnosis Date  . Allergic rhinitis, cause unspecified 02/08/2011  . Anemia, unspecified 04/11/2011  . 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 BPH (benign prostatic hyperplasia)   . BPH (benign prostatic hypertrophy) 02/08/2011  . Cataract    as a child, blind since 68 or 74 yrs old  . 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 Diabetes ([place])   . Elevated PSA 02/08/2011  . GERD (gastroesophageal reflux disease) 02/08/2011  . 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 History of prostatitis 04/11/2011  . Hyperlipidemia   . Kidney stone    passed stone - no surgery required  . Lumbar disc disease 02/06/2011   Past Surgical History:  Procedure Laterality Date  . BACK SURGERY     x 3  L4-L5  . COLONOSCOPY  2007   Dr Wynetta Emery  . CYST EXCISION    . WISDOM TOOTH EXTRACTION     Family History  Problem Relation Age of Onset  . Blindness Other   . Heart disease Other   . Hypertension Other   . Diabetes Other   . Mental illness Other   .  Sudden death Other   . Colon cancer Neg Hx   . Esophageal cancer Neg Hx   . Stomach cancer Neg Hx   . Rectal cancer Neg Hx    Social History   Socioeconomic History  . Marital status: Married    Spouse name: Not on file  . Number of children: Not on file  . Years of education: Not on file  . Highest education level: Not on file  Occupational History  . Not on file  Social Needs  . Financial resource strain: Not hard at all  . Food insecurity:    Worry: Never true    Inability: Never true  . Transportation needs:    Medical: No    Non-medical: No  Tobacco Use  . Smoking status: Former Smoker     Types: Cigarettes    Last attempt to quit: 05/21/1963    Years since quitting: 54.4  . Smokeless tobacco: Never Used  Substance and Sexual Activity  . Alcohol use: No    Alcohol/week: 0.0 standard drinks  . Drug use: No  . Sexual activity: Not on file  Lifestyle  . Physical activity:    Days per week: 4 days    Minutes per session: 50 min  . Stress: Not at all  Relationships  . Social connections:    Talks on phone: More than three times a week    Gets together: More than three times a week    Attends religious service: More than 4 times per year    Active member of club or organization: Yes    Attends meetings of clubs or organizations: More than 4 times per year    Relationship status: Married  Other Topics Concern  . Not on file  Social History Narrative  . Not on file   Tobacco Counseling Counseling given: Not Answered   Activities of Daily Living In your present state of health, do you have any difficulty performing the following activities: 10/28/2017  Hearing? N  Vision? Y  Difficulty concentrating or making decisions? N  Walking or climbing stairs? N  Dressing or bathing? N  Doing errands, shopping? N  Preparing Food and eating ? N  Using the Toilet? N  In the past six months, have you accidently leaked urine? Y  Do you have problems with loss of bowel control? N  Managing your Medications? N  Managing your Finances? N  Housekeeping or managing your Housekeeping? N  Some recent data might be hidden     Immunizations and Health Maintenance Immunization History  Administered Date(s) Administered  . Influenza-Unspecified 12/12/2009  . Pneumococcal Conjugate-13 02/13/2004, 08/01/2012  . Pneumococcal Polysaccharide-23 09/10/2014  . Td 06/15/2010  . Zoster 06/11/2009   Health Maintenance Due  Topic Date Due  . OPHTHALMOLOGY EXAM  09/02/2015  . INFLUENZA VACCINE  09/01/2017  . FOOT EXAM  09/21/2017    Patient Care Team: Biagio Borg, MD as PCP -  General (Internal Medicine) Kristeen Miss, MD as Consulting Physician (Neurosurgery) Armbruster, Carlota Raspberry, MD as Consulting Physician (Gastroenterology) Festus Aloe, MD as Consulting Physician (Urology) Rutherford Guys, MD as Consulting Physician (Ophthalmology)  Indicate any recent Medical Services you may have received from other than [facility] providers in the past year (date may be approximate).    Assessment:   This is a routine wellness examination for [place]. Physical assessment deferred to PCP.   Hearing/Vision screen Hearing Screening Comments: Able to hear conversational tones w/o difficulty. No issues reported. Passed whisper test  Vision Screening Comments: Blind  Dr. Gershon Crane, last appointment 10/07/17  Dietary issues and exercise activities discussed: Current Exercise Habits: Home exercise routine, Type of exercise: treadmill, Time (Minutes): 50, Frequency (Times/Week): 5, Weekly Exercise (Minutes/Week): 250, Intensity: Mild  Diet (meal preparation, eat out, water intake, caffeinated beverages, dairy products, fruits and vegetables): in general, a "healthy" diet  , well balanced   Reviewed heart healthy and diabetic diet. Encouraged patient to increase daily water and healthy fluid intake. Relevant patient education assigned to patient using Emmi.  Goals   None    Depression Screen PHQ 2/9 Scores 10/28/2017 09/12/2017 04/29/2017 05/20/2016  PHQ - 2 Score 0 0 0 0    Fall Risk Fall Risk  10/28/2017 09/12/2017 04/29/2017 09/21/2016 05/20/2016  Falls in the past year? No No No No No  Number falls in past yr: - - - - -  Injury with Fall? - - - - -   Cognitive Function:       Ad8 score reviewed for issues:  Issues making decisions: no  Less interest in hobbies / activities: no  Repeats questions, stories (family complaining): no  Trouble using ordinary gadgets (microwave, computer, phone):no  Forgets the month or year: no  Mismanaging finances: no  Remembering  appts: no  Daily problems with thinking and/or memory: no Ad8 score is= 0  Screening Tests Health Maintenance  Topic Date Due  . OPHTHALMOLOGY EXAM  09/02/2015  . INFLUENZA VACCINE  09/01/2017  . FOOT EXAM  09/21/2017  . HEMOGLOBIN A1C  10/30/2017  . URINE MICROALBUMIN  04/30/2018  . TETANUS/TDAP  06/14/2020  . COLONOSCOPY  12/10/2020  . Hepatitis C Screening  Completed  . PNA vac Low Risk Adult  Completed      Plan:     Continue doing brain stimulating activities (puzzles, reading, adult coloring books, staying active) to keep memory sharp.   Continue to eat heart healthy diet (full of fruits, vegetables, whole grains, lean protein, water--limit salt, fat, and sugar intake) and increase physical activity as tolerated.  I have personally reviewed and noted the following in the patient's chart:   . Medical and social history . Use of alcohol, tobacco or illicit drugs  . Current medications and supplements . Functional ability and status . Nutritional status . Physical activity . Advanced directives . List of other physicians . Vitals . Screenings to include cognitive, depression, and falls . Referrals and appointments  In addition, I have reviewed and discussed with patient certain preventive protocols, quality metrics, and best practice recommendations. A written personalized care plan for preventive services as well as general preventive health recommendations were provided to patient.     Michiel Cowboy, RN   10/28/2017      Medical screening examination/treatment/procedure(s) were performed by non-physician practitioner and as supervising physician I was immediately available for consultation/collaboration. I agree with above. [name], MD

## 2017-10-28 ENCOUNTER — Other Ambulatory Visit (INDEPENDENT_AMBULATORY_CARE_PROVIDER_SITE_OTHER): Payer: Medicare Other

## 2017-10-28 ENCOUNTER — Encounter: Payer: Self-pay | Admitting: Internal Medicine

## 2017-10-28 ENCOUNTER — Ambulatory Visit: Payer: Medicare Other | Admitting: Internal Medicine

## 2017-10-28 ENCOUNTER — Ambulatory Visit (INDEPENDENT_AMBULATORY_CARE_PROVIDER_SITE_OTHER): Payer: Medicare Other | Admitting: *Deleted

## 2017-10-28 ENCOUNTER — Other Ambulatory Visit: Payer: Self-pay | Admitting: Internal Medicine

## 2017-10-28 VITALS — BP 138/76 | HR 86 | Ht 68.0 in | Wt 182.0 lb

## 2017-10-28 VITALS — BP 138/76 | HR 86 | Temp 97.9°F | Ht 68.5 in | Wt 182.0 lb

## 2017-10-28 DIAGNOSIS — E119 Type 2 diabetes mellitus without complications: Secondary | ICD-10-CM

## 2017-10-28 DIAGNOSIS — R1030 Lower abdominal pain, unspecified: Secondary | ICD-10-CM | POA: Diagnosis not present

## 2017-10-28 DIAGNOSIS — R972 Elevated prostate specific antigen [PSA]: Secondary | ICD-10-CM

## 2017-10-28 DIAGNOSIS — E785 Hyperlipidemia, unspecified: Secondary | ICD-10-CM

## 2017-10-28 DIAGNOSIS — Z0000 Encounter for general adult medical examination without abnormal findings: Secondary | ICD-10-CM | POA: Diagnosis not present

## 2017-10-28 DIAGNOSIS — R109 Unspecified abdominal pain: Secondary | ICD-10-CM | POA: Insufficient documentation

## 2017-10-28 LAB — CBC WITH DIFFERENTIAL/PLATELET
BASOS ABS: 0 10*3/uL (ref 0.0–0.1)
BASOS PCT: 0.4 % (ref 0.0–3.0)
EOS ABS: 0.2 10*3/uL (ref 0.0–0.7)
Eosinophils Relative: 4 % (ref 0.0–5.0)
HEMATOCRIT: 43.5 % (ref 39.0–52.0)
Hemoglobin: 14.2 g/dL (ref 13.0–17.0)
LYMPHS ABS: 1.3 10*3/uL (ref 0.7–4.0)
Lymphocytes Relative: 29.2 % (ref 12.0–46.0)
MCHC: 32.6 g/dL (ref 30.0–36.0)
MCV: 86 fl (ref 78.0–100.0)
Monocytes Absolute: 0.5 10*3/uL (ref 0.1–1.0)
Monocytes Relative: 10.3 % (ref 3.0–12.0)
NEUTROS ABS: 2.5 10*3/uL (ref 1.4–7.7)
NEUTROS PCT: 56.1 % (ref 43.0–77.0)
Platelets: 193 10*3/uL (ref 150.0–400.0)
RBC: 5.05 Mil/uL (ref 4.22–5.81)
RDW: 14 % (ref 11.5–15.5)
WBC: 4.5 10*3/uL (ref 4.0–10.5)

## 2017-10-28 LAB — URINALYSIS, ROUTINE W REFLEX MICROSCOPIC
BILIRUBIN URINE: NEGATIVE
Hgb urine dipstick: NEGATIVE
KETONES UR: NEGATIVE
Leukocytes, UA: NEGATIVE
NITRITE: NEGATIVE
PH: 6 (ref 5.0–8.0)
SPECIFIC GRAVITY, URINE: 1.025 (ref 1.000–1.030)
TOTAL PROTEIN, URINE-UPE24: NEGATIVE
Urine Glucose: NEGATIVE
Urobilinogen, UA: 0.2 (ref 0.0–1.0)

## 2017-10-28 LAB — HEPATIC FUNCTION PANEL
ALBUMIN: 4.1 g/dL (ref 3.5–5.2)
ALK PHOS: 85 U/L (ref 39–117)
ALT: 12 U/L (ref 0–53)
AST: 15 U/L (ref 0–37)
BILIRUBIN DIRECT: 0.1 mg/dL (ref 0.0–0.3)
TOTAL PROTEIN: 7.8 g/dL (ref 6.0–8.3)
Total Bilirubin: 0.6 mg/dL (ref 0.2–1.2)

## 2017-10-28 LAB — BASIC METABOLIC PANEL
BUN: 13 mg/dL (ref 6–23)
CHLORIDE: 105 meq/L (ref 96–112)
CO2: 29 meq/L (ref 19–32)
CREATININE: 1.12 mg/dL (ref 0.40–1.50)
Calcium: 9.8 mg/dL (ref 8.4–10.5)
GFR: 82.94 mL/min (ref >60.00)
Glucose, Bld: 121 mg/dL — ABNORMAL HIGH (ref 70–99)
POTASSIUM: 4.4 meq/L (ref 3.5–5.1)
Sodium: 140 mEq/L (ref 135–145)

## 2017-10-28 LAB — LIPASE: Lipase: 12 U/L (ref 11.0–59.0)

## 2017-10-28 LAB — LIPID PANEL
Cholesterol: 178 mg/dL (ref 0–200)
HDL: 45 mg/dL (ref >39.00)
LDL Cholesterol: 110 mg/dL — ABNORMAL HIGH (ref 0–99)
NonHDL: 133.22
Total CHOL/HDL Ratio: 4
Triglycerides: 114 mg/dL (ref 0.0–149.0)
VLDL: 22.8 mg/dL (ref 0.0–40.0)

## 2017-10-28 LAB — HEMOGLOBIN A1C: HEMOGLOBIN A1C: 7.2 % — AB (ref 4.6–6.5)

## 2017-10-28 LAB — PSA: PSA: 7.9 ng/mL — ABNORMAL HIGH (ref 0.10–4.00)

## 2017-10-28 MED ORDER — CEPHALEXIN 500 MG PO CAPS: 500.0000 mg | ORAL_CAPSULE | Freq: Three times a day (TID) | ORAL | 0 refills | Status: AC

## 2017-10-28 NOTE — Progress Notes (Signed)
Subjective:    Patient ID: Vincent [name], male    DOB: May 05, 1945, 72 y.o.   MRN: [id number]  HPI  Here to f/u; overall doing ok,  Pt denies chest pain, increasing sob or doe, wheezing, orthopnea, PND, increased LE swelling, palpitations, dizziness or syncope.  Pt denies new neurological symptoms such as new headache, or facial or extremity weakness or numbness.  Pt denies polydipsia, polyuria, or low sugar episode.  Pt states overall good compliance with meds, mostly trying to follow appropriate diet, with wt overall up and down a few lbs either way.   BP Readings from Last 3 Encounters:  10/28/17 138/76  09/12/17 (!) 152/90  04/29/17 122/84   Wt Readings from Last 3 Encounters:  10/28/17 182 lb (82.6 kg)  09/12/17 187 lb 6.4 oz (85 kg)  04/29/17 184 lb (83.5 kg)  Denies urinary symptoms such as dysuria, frequency, urgency, flank pain, hematuria or fever, chills. Sees urology twice per year, but has had some mild 1 wk lower mid abd discomfort with nausea (no vomiting) Past Medical History:  Diagnosis Date  . Allergic rhinitis, cause unspecified 02/08/2011  . Anemia, unspecified 04/11/2011  . 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 BPH (benign prostatic hyperplasia)   . BPH (benign prostatic hypertrophy) 02/08/2011  . Cataract    as a child, blind since 61 or 25 yrs old  . 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 Diabetes ([employer])   . Elevated PSA 02/08/2011  . GERD (gastroesophageal reflux disease) 02/08/2011  . 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 History of prostatitis 04/11/2011  . Hyperlipidemia   . Kidney stone    passed stone - no surgery required  . Lumbar disc disease 02/06/2011   Past Surgical History:  Procedure Laterality Date  . BACK SURGERY     x 3  L4-L5  . COLONOSCOPY  2007   Dr Wynetta Emery  . CYST EXCISION    . WISDOM TOOTH EXTRACTION      reports that he quit smoking about 54 years ago. His smoking use included cigarettes. He has never  used smokeless tobacco. He reports that he does not drink alcohol or use drugs. family history includes Blindness in his other; Diabetes in his other; Heart disease in his other; Hypertension in his other; Mental illness in his other; Sudden death in his other. No Known Allergies Current Outpatient Medications on File Prior to Visit  Medication Sig Dispense Refill  . aspirin 81 MG EC tablet Take 1 tablet (81 mg total) by mouth daily. Swallow whole. 30 tablet 12  . finasteride (PROSCAR) 5 MG tablet TK 1 T PO QHS 90 tablet 3  . ibuprofen (ADVIL,MOTRIN) 800 MG tablet TAKE 1 TABLET(800 MG) BY MOUTH EVERY 8 HOURS AS NEEDED FOR PAIN 90 tablet 3  . lansoprazole (PREVACID) 30 MG capsule Take 1 capsule (30 mg total) by mouth daily. 90 capsule 3  . meloxicam (MOBIC) 7.5 MG tablet Take 1-2 tablets (7.5-15 mg total) by mouth daily. 60 tablet 0  . rosuvastatin (CRESTOR) 10 MG tablet Take 1 tablet (10 mg total) by mouth daily. 90 tablet 3  . tamsulosin (FLOMAX) 0.4 MG CAPS capsule TK ONE C PO  QHS  8   No current facility-administered medications on file prior to visit.    Review of Systems  Constitutional: Negative for other unusual diaphoresis or sweats HENT: Negative for ear discharge or swelling Eyes: Negative for other worsening visual disturbances Respiratory: Negative for stridor or  other swelling  Gastrointestinal: Negative for worsening distension or other blood Genitourinary: Negative for retention or other urinary change Musculoskeletal: Negative for other MSK pain or swelling Skin: Negative for color change or other new lesions Neurological: Negative for worsening tremors and other numbness  Psychiatric/Behavioral: Negative for worsening agitation or other fatigue All other system neg per pt    Objective:   Physical Exam BP 138/76   Pulse 86   Temp 97.9 F (36.6 C) (Oral)   Ht 5' 8.5" (1.74 m)   Wt 182 lb (82.6 kg)   SpO2 99%   BMI 27.27 kg/m  VS noted,  Constitutional: Pt  appears in NAD HENT: Head: NCAT.  Right Ear: External ear normal.  Left Ear: External ear normal.  Eyes: . Pupils are equal, round, and reactive to light. Conjunctivae and EOM are normal Nose: without d/c or deformity Neck: Neck supple. Gross normal ROM Cardiovascular: Normal rate and regular rhythm.   Pulmonary/Chest: Effort normal and breath sounds without rales or wheezing.  Abd:  Soft, NT, ND, + BS, no organomegaly Neurological: Pt is alert. At baseline orientation, motor grossly intact Skin: Skin is warm. No rashes, other new lesions, no LE edema Psychiatric: Pt behavior is normal without agitation  No other exam findings  Lab Results  Component Value Date   PSA 8.50 (H) 04/29/2017   PSA 11.76 (H) 09/21/2016   PSA 14.74 (H) 09/10/2014    Lab Results  Component Value Date   WBC 4.6 04/29/2017   HGB 13.0 04/29/2017   HCT 39.5 04/29/2017   PLT 181.0 04/29/2017   GLUCOSE 109 (H) 04/29/2017   CHOL 145 04/29/2017   TRIG 103.0 04/29/2017   HDL 45.10 04/29/2017   LDLCALC 79 04/29/2017   ALT 13 04/29/2017   AST 17 04/29/2017   NA 139 04/29/2017   K 4.1 04/29/2017   CL 106 04/29/2017   CREATININE 1.10 04/29/2017   BUN 19 04/29/2017   CO2 29 04/29/2017   TSH 1.03 04/29/2017   PSA 8.50 (H) 04/29/2017   HGBA1C 6.9 (H) 04/29/2017   MICROALBUR <0.7 04/29/2017       Assessment & Plan:

## 2017-10-28 NOTE — Assessment & Plan Note (Signed)
stable overall by history and exam, recent data reviewed with pt, and pt to continue medical treatment as before,  to f/u any worsening symptoms or concerns, for f/u labs today 

## 2017-10-28 NOTE — Assessment & Plan Note (Signed)
?   Prostate vs other related, for labs as ordered including UA, with tx pending results

## 2017-10-28 NOTE — Patient Instructions (Signed)
You had the flu shot today  Please continue all other medications as before, and refills have been done if requested.  Please have the pharmacy call with any other refills you may need.  Please continue your efforts at being more active, low cholesterol diabetic diet, and weight control.  Please keep your appointments with your specialists as you may have planned  Please go to the LAB in the Basement (turn left off the elevator) for the tests to be done today  You will be contacted by phone if any changes need to be made immediately.  Otherwise, you will receive a letter about your results with an explanation, but please check with MyChart first.  Please remember to sign up for MyChart if you have not done so, as this will be important to you in the future with finding out test results, communicating by private email, and scheduling acute appointments online when needed.  Please return in 6 months, or sooner if needed

## 2017-10-28 NOTE — Patient Instructions (Addendum)
[email or web address]  Continue doing brain stimulating activities (puzzles, reading, adult coloring books, staying active) to keep memory sharp.   Continue to eat heart healthy diet (full of fruits, vegetables, whole grains, lean protein, water--limit salt, fat, and sugar intake) and increase physical activity as tolerated.   Vincent [name] , Thank you for taking time to come for your Medicare Wellness Visit. I appreciate your ongoing commitment to your health goals. Please review the following plan we discussed and let me know if I can assist you in the future.   These are the goals we discussed: Goals    . Patient Stated     I will try to increase the amount of vegetables and fruit and cut back on fried foods.       This is a list of the screening recommended for you and due dates:  Health Maintenance  Topic Date Due  . Eye exam for diabetics  09/02/2015  . Flu Shot  09/01/2017  . Complete foot exam   09/21/2017  . Hemoglobin A1C  10/30/2017  . Urine Protein Check  04/30/2018  . Tetanus Vaccine  06/14/2020  . Colon Cancer Screening  12/10/2020  .  Hepatitis C: One time screening is recommended by Center for Disease Control  (CDC) for  adults born from [birth year] through 1965.   Completed  . Pneumonia vaccines  Completed

## 2017-10-28 NOTE — Assessment & Plan Note (Signed)
Now sees Dr Eskridge/urology every 6 mo, s/p prostate bx x 3, ok to f/u lab today - ? Due to chronic prostatitis

## 2017-10-28 NOTE — Assessment & Plan Note (Signed)
stable overall by history and exam, recent data reviewed with pt, and pt to continue medical treatment as before,  to f/u any worsening symptoms or concerns Lab Results  Component Value Date   LDLCALC 79 04/29/2017

## 2017-10-31 ENCOUNTER — Ambulatory Visit: Payer: Medicare Other | Admitting: Internal Medicine

## 2017-11-04 ENCOUNTER — Telehealth: Payer: Self-pay | Admitting: Internal Medicine

## 2017-11-04 NOTE — Telephone Encounter (Signed)
Copied from [facility] [id number]. Topic: Quick Communication - See Telephone Encounter >> Nov 04, 2017  9:11 AM Ahmed Prima L wrote: CRM for notification. See Telephone encounter for: 11/04/17.  Patient needs his " URINALYSIS " results from 9/27 faxed to his urologist, Dr Junious Silk @ [facility]. Fax Number is [phone]

## 2017-11-04 NOTE — Telephone Encounter (Signed)
Results have been faxed

## 2018-03-22 IMAGING — MR MR KNEE*R* W/O CM
4 of 5 series · 32 of 40 positions shown · non-contrast
Comparison: Plain films the right knee 08/02/2015.

CLINICAL DATA: Anterior and medial right knee pain for 3 weeks. No
known injury. Initial encounter.

EXAM:
MRI OF THE RIGHT KNEE WITHOUT CONTRAST
TECHNIQUE: Multiplanar, multisequence MR imaging of the knee was performed. No
intravenous contrast was administered.

[Series 6: PD fat-sat · axial · right · 3.0mm · 0.39mm/px · z∈[-51,+94]mm · 9 of 43 slices shown (1 of 3)]
[im 1/43]
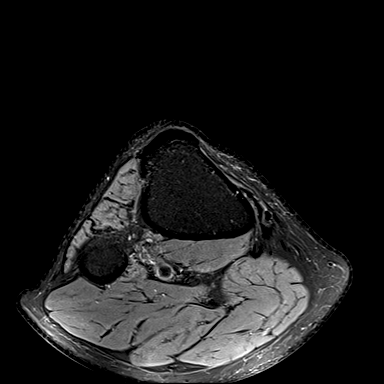
[im 6/43]
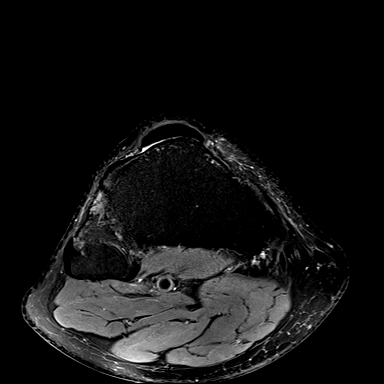
[im 11/43]
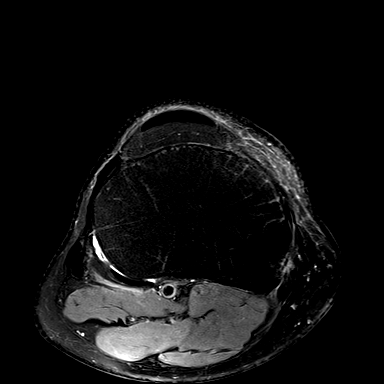
[im 16/43]
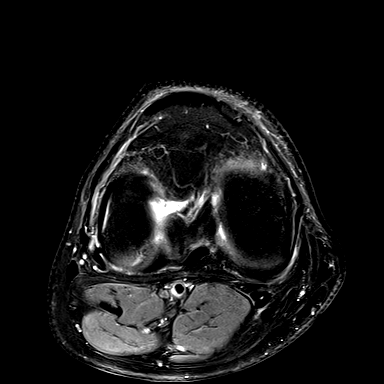
[im 22/43]
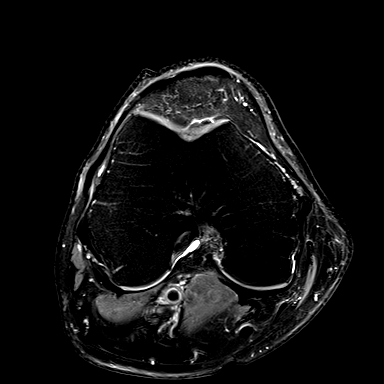
[im 27/43]
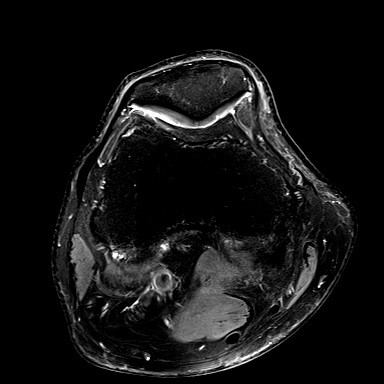
[im 32/43]
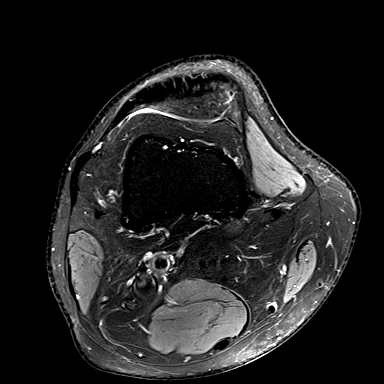
[im 37/43]
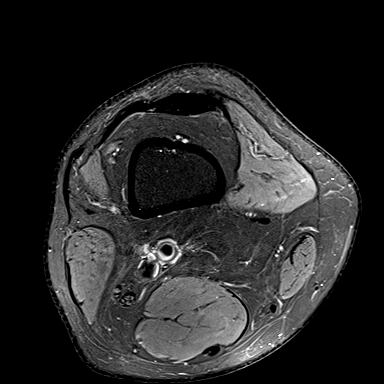
[im 43/43]
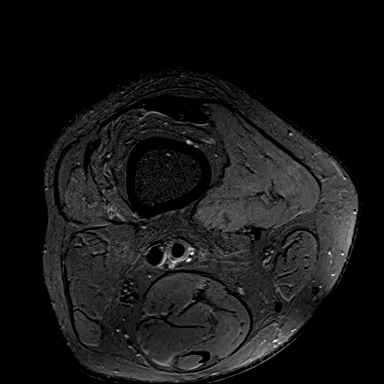

[Series 8: PD fat-sat · sagittal · right · 3.0mm · 0.39mm/px · 7 of 30 slices shown (2 of 3)]
[im 1/30]
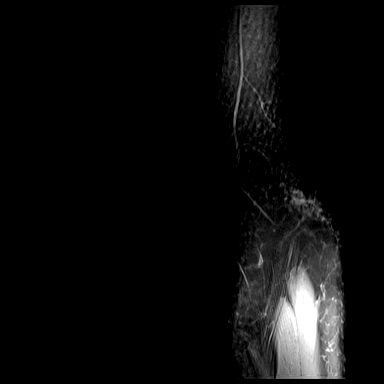
[im 5/30]
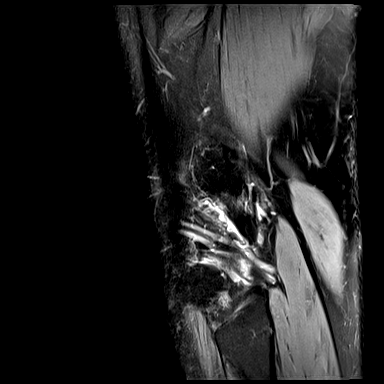
[im 10/30]
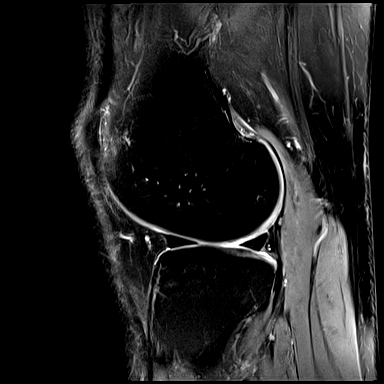
[im 15/30]
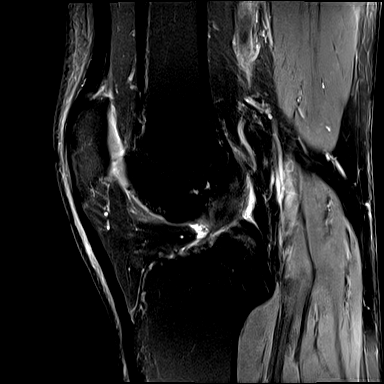
[im 20/30]
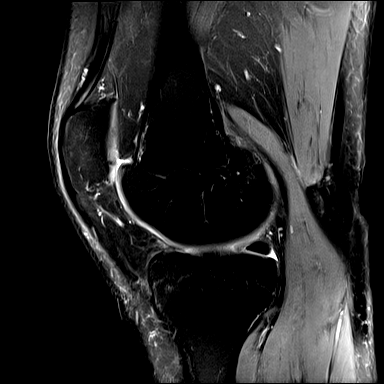
[im 25/30]
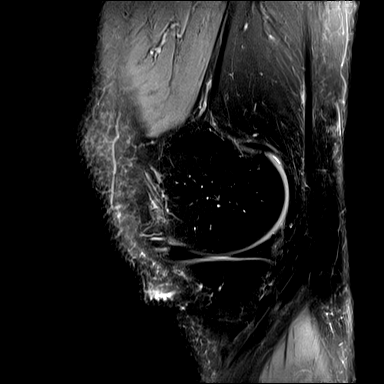
[im 30/30]
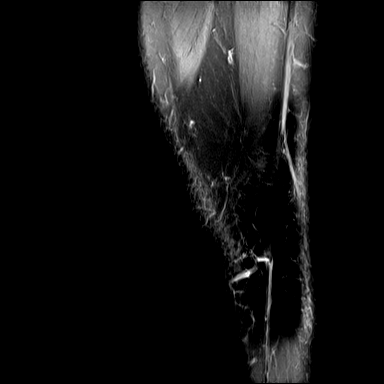

[Series 9: PD fat-sat · coronal · right · 3.0mm · 0.36mm/px · 8 of 35 slices shown (3 of 3)]
[im 1/35]
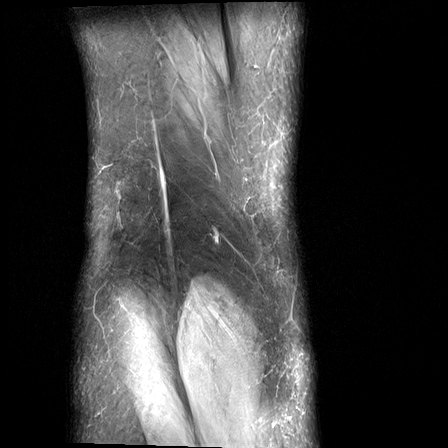
[im 5/35]
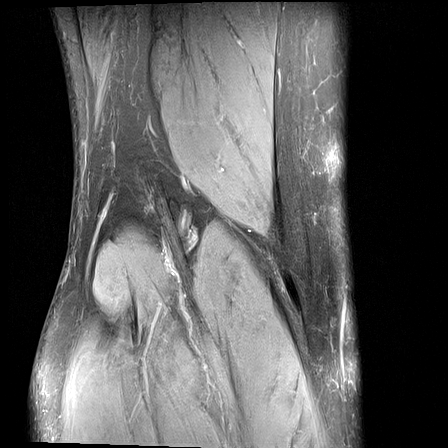
[im 10/35]
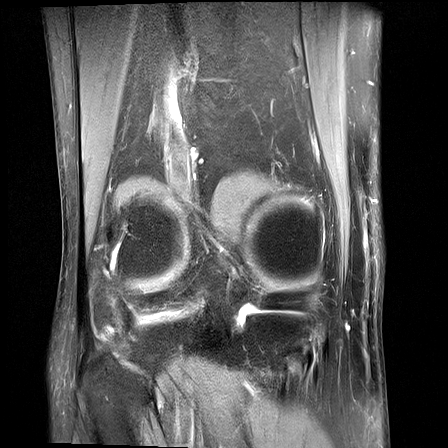
[im 15/35]
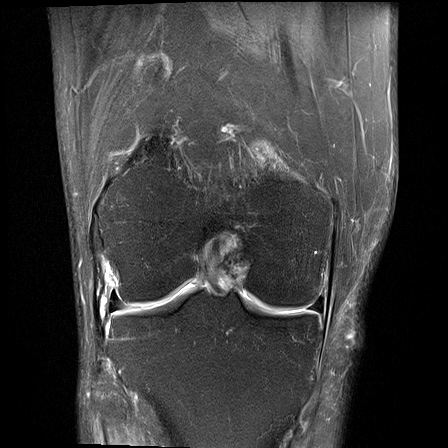
[im 20/35]
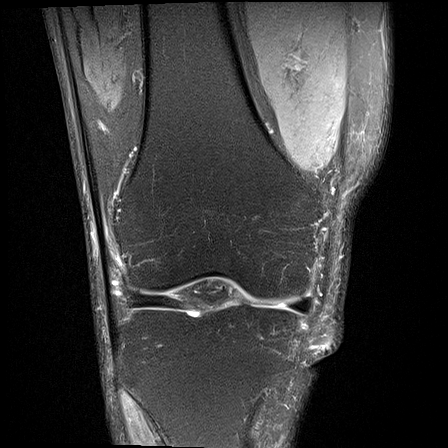
[im 25/35]
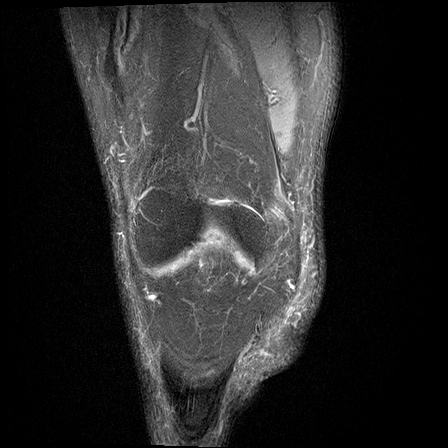
[im 30/35]
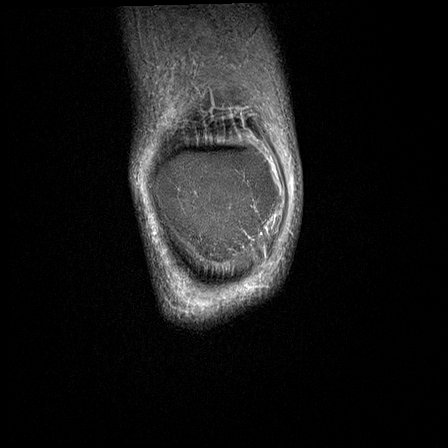
[im 35/35]
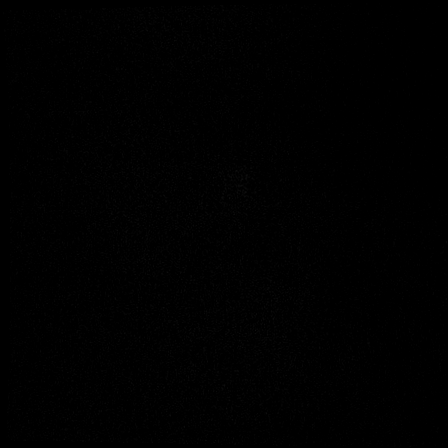

[Series 10: T2 fat-sat · coronal · right · 3.0mm · 0.42mm/px · 8 of 35 slices shown]
[im 1/35]
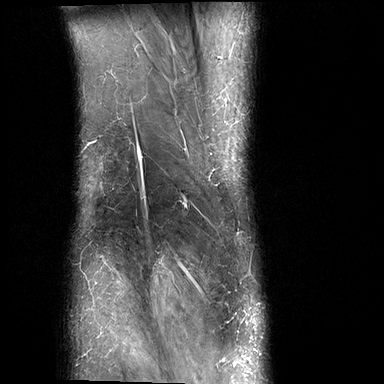
[im 5/35]
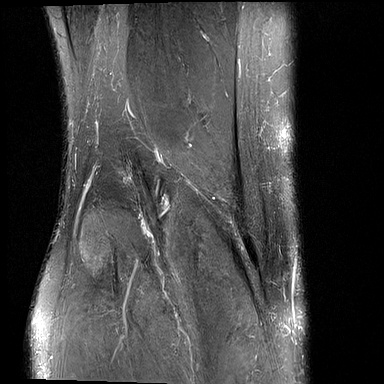
[im 10/35]
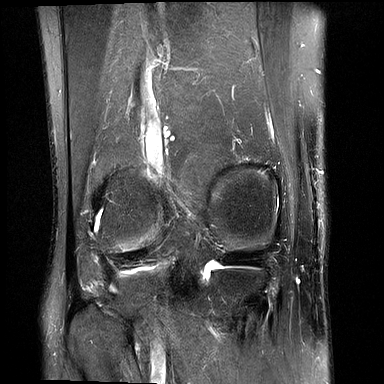
[im 15/35]
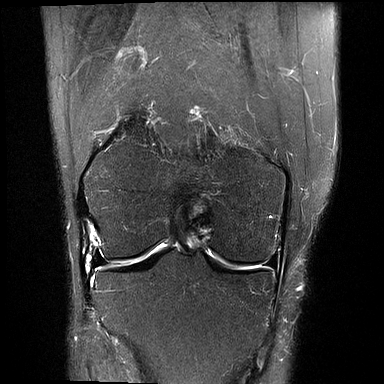
[im 20/35]
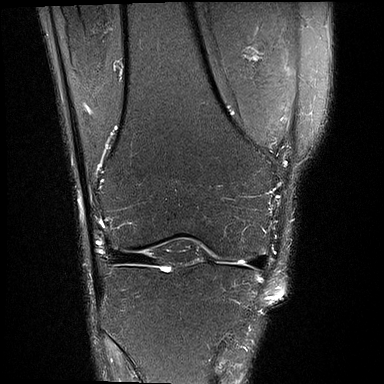
[im 25/35]
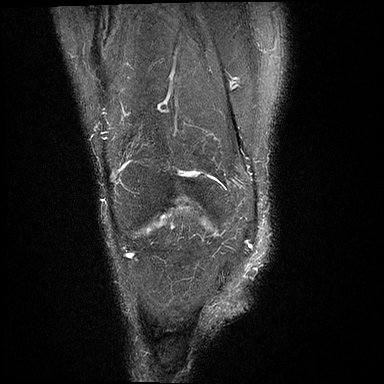
[im 30/35]
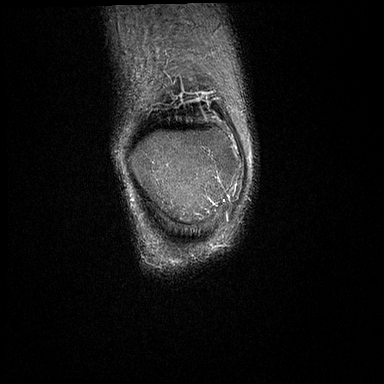
[im 35/35]
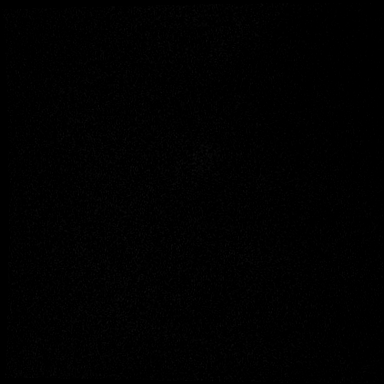

[32 of 40 positions shown; findings below may reference images not displayed]

FINDINGS: MENISCI

Medial meniscus:  Intact.

Lateral meniscus: Intact. Mild fraying along the free edge of the
body is noted.

LIGAMENTS

Cruciates:  Intact.

Collaterals:  Intact.

CARTILAGE

Patellofemoral: Small fissures are seen at the apex and along the
medial facet. Tiny focal defect along the medial facet with a small
underlying subchondral cyst also noted. There is some hyaline
cartilage loss along the central femoral trochlea inferiorly with
tiny subchondral cysts.

Medial:  Unremarkable.

Lateral: Thinning of hyaline cartilage along the posterior
weight-bearing surface of the lateral femoral condyle without
reactive marrow signal change is noted.

Joint:  No joint effusion.

Popliteal Fossa:  No Baker's cyst.

Extensor Mechanism:  Intact.

Bones:  Normal marrow signal throughout.

Other: None.
IMPRESSION: Negative for meniscal or ligament tear.  No internal derangement.

Mild degenerative change patellofemoral and lateral compartments.

## 2018-05-02 ENCOUNTER — Ambulatory Visit: Payer: Medicare Other | Admitting: Internal Medicine

## 2018-05-05 ENCOUNTER — Other Ambulatory Visit: Payer: Self-pay | Admitting: Internal Medicine

## 2018-05-31 ENCOUNTER — Other Ambulatory Visit: Payer: Self-pay | Admitting: Internal Medicine

## 2018-06-16 DIAGNOSIS — M25562 Pain in left knee: Secondary | ICD-10-CM | POA: Insufficient documentation

## 2018-07-11 ENCOUNTER — Other Ambulatory Visit: Payer: Self-pay | Admitting: Internal Medicine

## 2018-07-11 ENCOUNTER — Other Ambulatory Visit: Payer: Self-pay

## 2018-07-11 ENCOUNTER — Other Ambulatory Visit (INDEPENDENT_AMBULATORY_CARE_PROVIDER_SITE_OTHER): Payer: Medicare Other

## 2018-07-11 ENCOUNTER — Encounter: Payer: Self-pay | Admitting: Internal Medicine

## 2018-07-11 ENCOUNTER — Ambulatory Visit (INDEPENDENT_AMBULATORY_CARE_PROVIDER_SITE_OTHER): Payer: Medicare Other | Admitting: Internal Medicine

## 2018-07-11 VITALS — BP 122/76 | HR 68 | Temp 97.8°F | Ht 68.0 in | Wt 181.0 lb

## 2018-07-11 DIAGNOSIS — E559 Vitamin D deficiency, unspecified: Secondary | ICD-10-CM

## 2018-07-11 DIAGNOSIS — Z0000 Encounter for general adult medical examination without abnormal findings: Secondary | ICD-10-CM | POA: Diagnosis not present

## 2018-07-11 DIAGNOSIS — E538 Deficiency of other specified B group vitamins: Secondary | ICD-10-CM

## 2018-07-11 DIAGNOSIS — E611 Iron deficiency: Secondary | ICD-10-CM

## 2018-07-11 DIAGNOSIS — R972 Elevated prostate specific antigen [PSA]: Secondary | ICD-10-CM

## 2018-07-11 DIAGNOSIS — E119 Type 2 diabetes mellitus without complications: Secondary | ICD-10-CM

## 2018-07-11 DIAGNOSIS — M545 Low back pain: Secondary | ICD-10-CM

## 2018-07-11 DIAGNOSIS — Z0001 Encounter for general adult medical examination with abnormal findings: Secondary | ICD-10-CM | POA: Diagnosis not present

## 2018-07-11 DIAGNOSIS — E785 Hyperlipidemia, unspecified: Secondary | ICD-10-CM

## 2018-07-11 DIAGNOSIS — M25511 Pain in right shoulder: Secondary | ICD-10-CM | POA: Diagnosis not present

## 2018-07-11 DIAGNOSIS — G8929 Other chronic pain: Secondary | ICD-10-CM

## 2018-07-11 LAB — CBC WITH DIFFERENTIAL/PLATELET
Basophils Absolute: 0 10*3/uL (ref 0.0–0.1)
Basophils Relative: 0.6 % (ref 0.0–3.0)
Eosinophils Absolute: 0.2 10*3/uL (ref 0.0–0.7)
Eosinophils Relative: 3.3 % (ref 0.0–5.0)
HCT: 42.4 % (ref 39.0–52.0)
Hemoglobin: 13.6 g/dL (ref 13.0–17.0)
Lymphocytes Relative: 33.6 % (ref 12.0–46.0)
Lymphs Abs: 1.6 10*3/uL (ref 0.7–4.0)
MCHC: 32.1 g/dL (ref 30.0–36.0)
MCV: 87.1 fl (ref 78.0–100.0)
Monocytes Absolute: 0.3 10*3/uL (ref 0.1–1.0)
Monocytes Relative: 7.2 % (ref 3.0–12.0)
Neutro Abs: 2.6 10*3/uL (ref 1.4–7.7)
Neutrophils Relative %: 55.3 % (ref 43.0–77.0)
Platelets: 173 10*3/uL (ref 150.0–400.0)
RBC: 4.87 Mil/uL (ref 4.22–5.81)
RDW: 14 % (ref 11.5–15.5)
WBC: 4.8 10*3/uL (ref 4.0–10.5)

## 2018-07-11 LAB — URINALYSIS, ROUTINE W REFLEX MICROSCOPIC
Bilirubin Urine: NEGATIVE
Hgb urine dipstick: NEGATIVE
Ketones, ur: NEGATIVE
Nitrite: NEGATIVE
Specific Gravity, Urine: 1.02 (ref 1.000–1.030)
Total Protein, Urine: NEGATIVE
Urine Glucose: NEGATIVE
Urobilinogen, UA: 0.2 (ref 0.0–1.0)
pH: 6 (ref 5.0–8.0)

## 2018-07-11 LAB — VITAMIN B12: Vitamin B-12: 384 pg/mL (ref 211–911)

## 2018-07-11 LAB — IBC PANEL
Iron: 109 ug/dL (ref 42–165)
Saturation Ratios: 37.1 % (ref 20.0–50.0)
Transferrin: 210 mg/dL — ABNORMAL LOW (ref 212.0–360.0)

## 2018-07-11 LAB — TSH: TSH: 1.06 u[IU]/mL (ref 0.35–4.50)

## 2018-07-11 LAB — LIPID PANEL
Cholesterol: 176 mg/dL (ref 0–200)
HDL: 51.3 mg/dL (ref >39.00)
LDL Cholesterol: 110 mg/dL — ABNORMAL HIGH (ref 0–99)
NonHDL: 124.93
Total CHOL/HDL Ratio: 3
Triglycerides: 74 mg/dL (ref 0.0–149.0)
VLDL: 14.8 mg/dL (ref 0.0–40.0)

## 2018-07-11 LAB — MICROALBUMIN / CREATININE URINE RATIO
Creatinine,U: 141.6 mg/dL
Microalb Creat Ratio: 0.5 mg/g (ref 0.0–30.0)
Microalb, Ur: <0.7 mg/dL (ref 0.0–1.9)

## 2018-07-11 LAB — BASIC METABOLIC PANEL
BUN: 15 mg/dL (ref 6–23)
CO2: 26 mEq/L (ref 19–32)
Calcium: 9.6 mg/dL (ref 8.4–10.5)
Chloride: 104 mEq/L (ref 96–112)
Creatinine, Ser: 1.06 mg/dL (ref 0.40–1.50)
GFR: 82.99 mL/min (ref >60.00)
Glucose, Bld: 101 mg/dL — ABNORMAL HIGH (ref 70–99)
Potassium: 4.2 mEq/L (ref 3.5–5.1)
Sodium: 137 mEq/L (ref 135–145)

## 2018-07-11 LAB — VITAMIN D 25 HYDROXY (VIT D DEFICIENCY, FRACTURES): VITD: 29.89 ng/mL — ABNORMAL LOW (ref 30.00–100.00)

## 2018-07-11 LAB — HEPATIC FUNCTION PANEL
ALT: 14 U/L (ref 0–53)
AST: 18 U/L (ref 0–37)
Albumin: 4.1 g/dL (ref 3.5–5.2)
Alkaline Phosphatase: 87 U/L (ref 39–117)
Bilirubin, Direct: 0.1 mg/dL (ref 0.0–0.3)
Total Bilirubin: 0.5 mg/dL (ref 0.2–1.2)
Total Protein: 7.8 g/dL (ref 6.0–8.3)

## 2018-07-11 LAB — HEMOGLOBIN A1C: Hgb A1c MFr Bld: 7.1 % — ABNORMAL HIGH (ref 4.6–6.5)

## 2018-07-11 MED ORDER — VITAMIN D (ERGOCALCIFEROL) 1.25 MG (50000 UNIT) PO CAPS: 50000.0000 [IU] | ORAL_CAPSULE | ORAL | 0 refills | Status: DC

## 2018-07-11 MED ORDER — ROSUVASTATIN CALCIUM 20 MG PO TABS: 20.0000 mg | ORAL_TABLET | Freq: Every day | ORAL | 3 refills | Status: DC

## 2018-07-11 NOTE — Patient Instructions (Signed)

## 2018-07-11 NOTE — Progress Notes (Signed)
Subjective:    Patient ID: Vincent [name], male    DOB: [birth year], 73 y.o.   MRN: [id number]  HPI  Here for wellness and f/u;  Overall doing ok;  Pt denies Chest pain, worsening SOB, DOE, wheezing, orthopnea, PND, worsening LE edema, palpitations, dizziness or syncope.  Pt denies neurological change such as new headache, facial or extremity weakness.  Pt denies polydipsia, polyuria, or low sugar symptoms. Pt states overall good compliance with treatment and medications, good tolerability, and has been trying to follow appropriate diet.  Pt denies worsening depressive symptoms, suicidal ideation or panic. No fever, night sweats, wt loss, loss of appetite, or other constitutional symptoms.  Pt states good ability with ADL's, has low fall risk, home safety reviewed and adequate, no other significant changes in hearing or vision, and only occasionally active with exercise.  Also,plans to call for elevated PSA and recent UTI with [facility] soon.  Denies urinary symptoms such as dysuria, frequency, urgency, flank pain, hematuria or n/v, fever, chills. Also, c/o right shoulder pain x 2 mo, mild to mod, dull, intermittent, worse to try to raise the arm but really cant raise past shoulder height.  No falls or trauma, no unusual excercises Pt continues to have recurring LBP without change in severity, bowel or bladder change, fever, wt loss,  worsening LE pain/numbness/weakness, gait change or falls. Past Medical History:  Diagnosis Date  . Allergic rhinitis, cause unspecified 02/08/2011  . Anemia, unspecified 04/11/2011  . 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 BPH (benign prostatic hyperplasia)   . BPH (benign prostatic hypertrophy) 02/08/2011  . Cataract    as a child, blind since 67 or 57 yrs old  . 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 Diabetes ([place])   . Elevated PSA 02/08/2011  . GERD (gastroesophageal reflux disease) 02/08/2011  . Hepatitis B antibody positive  04/11/2011  . History of prostatitis 04/11/2011  . Hyperlipidemia   . Kidney stone    passed stone - no surgery required  . Lumbar disc disease 02/06/2011   Past Surgical History:  Procedure Laterality Date  . BACK SURGERY     x 3  L4-L5  . COLONOSCOPY  2007   Dr Wynetta Emery  . CYST EXCISION    . WISDOM TOOTH EXTRACTION      reports that he quit smoking about 55 years ago. His smoking use included cigarettes. He has never used smokeless tobacco. He reports that he does not drink alcohol or use drugs. family history includes Blindness in an other family member; Diabetes in an other family member; Heart disease in an other family member; Hypertension in an other family member; Mental illness in an other family member; Sudden death in an other family member. No Known Allergies Current Outpatient Medications on File Prior to Visit  Medication Sig Dispense Refill  . aspirin 81 MG EC tablet Take 1 tablet (81 mg total) by mouth daily. Swallow whole. 30 tablet 12  . finasteride (PROSCAR) 5 MG tablet TK 1 T PO QHS 90 tablet 3  . ibuprofen (ADVIL,MOTRIN) 800 MG tablet TAKE 1 TABLET(800 MG) BY MOUTH EVERY 8 HOURS AS NEEDED FOR PAIN 90 tablet 0  . lansoprazole (PREVACID) 30 MG capsule TAKE 1 CAPSULE(30 MG) BY MOUTH DAILY 90 capsule 0  . meloxicam (MOBIC) 7.5 MG tablet Take 1-2 tablets (7.5-15 mg total) by mouth daily. 60 tablet 0  . tamsulosin (FLOMAX) 0.4 MG CAPS capsule TK ONE C PO  QHS  8  No current facility-administered medications on file prior to visit.    Review of Systems Constitutional: Negative for other unusual diaphoresis, sweats, appetite or weight changes HENT: Negative for other worsening hearing loss, ear pain, facial swelling, mouth sores or neck stiffness.   Eyes: Negative for other worsening pain, redness or other visual disturbance.  Respiratory: Negative for other stridor or swelling Cardiovascular: Negative for other palpitations or other chest pain  Gastrointestinal: Negative  for worsening diarrhea or loose stools, blood in stool, distention or other pain Genitourinary: Negative for hematuria, flank pain or other change in urine volume.  Musculoskeletal: Negative for myalgias or other joint swelling.  Skin: Negative for other color change, or other wound or worsening drainage.  Neurological: Negative for other syncope or numbness. Hematological: Negative for other adenopathy or swelling Psychiatric/Behavioral: Negative for hallucinations, other worsening agitation, SI, self-injury, or new decreased concentration All other system neg per pt    Objective:   Physical Exam BP 122/76   Pulse 68   Temp 97.8 F (36.6 C) (Oral)   Ht 5\' 8"  (1.727 m)   Wt 181 lb (82.1 kg)   SpO2 98%   BMI 27.52 kg/m  VS noted,  Constitutional: Pt is oriented to person, place, and time. Appears well-developed and well-nourished, in no significant distress and comfortable Head: Normocephalic and atraumatic  Eyes: Conjunctivae and EOM are normal. Pupils are equal, round, and reactive to light Right Ear: External ear normal without discharge Left Ear: External ear normal without discharge Nose: Nose without discharge or deformity Mouth/Throat: Oropharynx is without other ulcerations and moist  Neck: Normal range of motion. Neck supple. No JVD present. No tracheal deviation present or significant neck LA or mass Cardiovascular: Normal rate, regular rhythm, normal heart sounds and intact distal pulses.   Pulmonary/Chest: WOB normal and breath sounds without rales or wheezing  Abdominal: Soft. Bowel sounds are normal. NT. No HSM  Musculoskeletal: Normal range of motion. Exhibits no edema, right shoulder nontender but pain with abduction to 90 degrees only Lymphadenopathy: Has no other cervical adenopathy.  Neurological: Pt is alert and oriented to person, place, and time. Pt has normal reflexes. No cranial nerve deficit. Motor grossly intact, Gait intact Skin: Skin is warm and dry. No  rash noted or new ulcerations Psychiatric:  Has normal mood and affect. Behavior is normal without agitation No other exam findings Lab Results  Component Value Date   WBC 4.8 07/11/2018   HGB 13.6 07/11/2018   HCT 42.4 07/11/2018   PLT 173.0 07/11/2018   GLUCOSE 101 (H) 07/11/2018   CHOL 176 07/11/2018   TRIG 74.0 07/11/2018   HDL 51.30 07/11/2018   LDLCALC 110 (H) 07/11/2018   ALT 14 07/11/2018   AST 18 07/11/2018   NA 137 07/11/2018   K 4.2 07/11/2018   CL 104 07/11/2018   CREATININE 1.06 07/11/2018   BUN 15 07/11/2018   CO2 26 07/11/2018   TSH 1.06 07/11/2018   PSA 7.90 (H) 10/28/2017   HGBA1C 7.1 (H) 07/11/2018   MICROALBUR <0.7 07/11/2018       Assessment & Plan:

## 2018-07-12 ENCOUNTER — Telehealth: Payer: Self-pay

## 2018-07-12 NOTE — Telephone Encounter (Signed)
Called pt, LVM.   CRM created.  

## 2018-07-12 NOTE — Telephone Encounter (Signed)
-----   Message from Vincent Borg, MD sent at 07/11/2018  6:27 PM EDT ----- Left message on MyChart, pt to cont same tx except  he test results show that your current treatment is OK, except the LDL cholesterol is higher than the goal of 70, and the Vitamin D level is low.    We need to: 1) increase the crestor to 20 mg per day 2) Please take Vitamin D 50000 units weekly for 12 weeks, then plan to change to OTC Vitamin D3 at 2000 units per day, indefinitely.    Vincent [name] to please inform pt, I will do rx x 2

## 2018-07-16 ENCOUNTER — Encounter: Payer: Self-pay | Admitting: Internal Medicine

## 2018-07-16 DIAGNOSIS — M25511 Pain in right shoulder: Secondary | ICD-10-CM | POA: Insufficient documentation

## 2018-07-16 DIAGNOSIS — E559 Vitamin D deficiency, unspecified: Secondary | ICD-10-CM | POA: Insufficient documentation

## 2018-07-16 NOTE — Assessment & Plan Note (Signed)
With goal ldl < 70, consider increased statin

## 2018-07-16 NOTE — Assessment & Plan Note (Signed)
stable overall by history and exam, recent data reviewed with pt, and pt to continue medical treatment as before,  to f/u any worsening symptoms or concerns, for a1c with labs 

## 2018-07-16 NOTE — Assessment & Plan Note (Signed)
For f/u lab, may need further po replacement

## 2018-07-16 NOTE — Assessment & Plan Note (Signed)

## 2018-07-16 NOTE — Assessment & Plan Note (Signed)
stable overall by history and exam, recent data reviewed with pt, and pt to continue medical treatment as before,  to f/u any worsening symptoms or concerns  

## 2018-07-16 NOTE — Assessment & Plan Note (Signed)
Sees Dr Nesi/urology every 6 mo, s/p prostate bx x 3,  to f/u any worsening symptoms or concerns

## 2018-07-16 NOTE — Assessment & Plan Note (Addendum)
With high suspicion for rotater cuff disease, d/w pt, ok for referral to sport medicine, but pt states will make appt if he decides  In addition to the time spent performing CPE, I spent an additional 25 minutes face to face,in which greater than 50% of this time was spent in counseling and coordination of care for patient's acute illness as documented, including the differential dx, treatment, further evaluation and other management of right shoulder pain, HLD, DM, elevated PSA, vit d deficiency

## 2018-07-26 ENCOUNTER — Ambulatory Visit: Payer: Medicare Other | Admitting: Family Medicine

## 2018-07-31 ENCOUNTER — Ambulatory Visit: Payer: Self-pay

## 2018-07-31 NOTE — Telephone Encounter (Signed)
Incoming call from Pt. Complaining of his chest  Tightness, and abdominal discomfort. Patients states that he did not take his new medication for two days and did not have the the discomforts in his chest nor his stomach.  States the discomfort comes and goes. Rates the chest pain as severe  when it happens .  Feels a little nauseated.  Onset was was Thursday or Friday Patient would like for Dr.  Jeneen Rinks review his medication an advise .  Patient request return phone phone call to [phone] and have pt. Paged.   Reason for Disposition . Abdominal pain  Answer Assessment - Initial Assessment Questions 1. LOCATION: "Where does it hurt?"       Center of chest2. RADIATION: "Does the pain go anywhere else?" (e.g., into neck, jaw, arms, back)      3. ONSET: "When did the chest pain begin?" (Minutes, hours or days)      Thursday and friday 4. PATTERN "Does the pain come and go, or has it been constant since it started?"  "Does it get worse with exertion?"      Comes and go 5. DURATION: "How long does it last" (e.g., seconds, minutes, hours)     *No Answer* 6. SEVERITY: "How bad is the pain?"  (e.g., Scale 1-10; mild, moderate, or severe)    - MILD (1-3): doesn't interfere with normal activities     - MODERATE (4-7): interferes with normal activities or awakens from sleep    - SEVERE (8-10): excruciating pain, unable to do any normal activities       severe 7. CARDIAC RISK FACTORS: "Do you have any history of heart problems or risk factors for heart disease?" (e.g., prior heart attack, angina; high blood pressure, diabetes, being overweight, high cholesterol, smoking, or strong family history of heart disease)     no 8. PULMONARY RISK FACTORS: "Do you have any history of lung disease?"  (e.g., blood clots in lung, asthma, emphysema, birth control pills)     denies 9. CAUSE: "What do you think is causing the chest pain?"     *No Answer* 10. OTHER SYMPTOMS: "Do you have any other symptoms?" (e.g.,  dizziness, nausea, vomiting, sweating, fever, difficulty breathing, cough)       Nausea abdominal  Pain 11. PREGNANCY: "Is there any chance you are pregnant?" "When was your last menstrual period?"       *No Answer*  Answer Assessment - Initial Assessment Questions 1. LOCATION: "Where does it hurt?"     Bottom part of stomach 2. RADIATION: "Does the pain shoot anywhere else?" (e.g., chest, back)     legs 3. ONSET: "When did the pain begin?" (e.g., minutes, hours or days ago)      Thursday, friday 4. SUDDEN: "Gradual or sudden onset?"     *No Answer* 5. PATTERN "Does the pain come and go, or is it constant?"    - If constant: "Is it getting better, staying the same, or worsening?"      (Note: Constant means the pain never goes away completely; most serious pain is constant and it progresses)     - If intermittent: "How long does it last?" "Do you have pain now?"     (Note: Intermittent means the pain goes away completely between bouts)    Comes and goesERITY: "How bad is the pain?"  (e.g., Scale 1-10; mild, moderate, or severe)    - MILD (1-3): doesn't interfere with normal activities, abdomen soft and not tender to  touch     - MODERATE (4-7): interferes with normal activities or awakens from sleep, tender to touch     - SEVERE (8-10): excruciating pain, doubled over, unable to do any normal activities      moderate 7. RECURRENT SYMPTOM: "Have you ever had this type of abdominal pain before?" If so, ask: "When was the last time?" and "What happened that time?"      8. AGGRAVATING FACTORS: "Does anything seem to cause this pain?" (e.g., foods, stress, alcohol)     denies 9. CARDIAC SYMPTOMS: "Do you have any of the following symptoms: chest pain, difficulty breathing, sweating, nausea?"     A little SOB , nausea10. OTHER SYMPTOMS: "Do you have any other symptoms?" (e.g., fever, vomiting, diarrhea)       11. PREGNANCY: "Is there any chance you are pregnant?" "When was your last menstrual  period?"       na  Protocols used: ABDOMINAL PAIN - UPPER-A-AH, CHEST PAIN-A-AH

## 2018-08-01 NOTE — Telephone Encounter (Signed)
Please schedule pt for an OV.

## 2018-08-01 NOTE — Telephone Encounter (Signed)
His medications should not be contributing to his symptoms.  Please consider OV here with me or PA, or UC or ED if pain worsens, fever, or other unusual symptoms occur

## 2018-08-01 NOTE — Telephone Encounter (Signed)
Pt was given Dr. Gwynn Burly response and scheduled for 7/1

## 2018-08-02 ENCOUNTER — Ambulatory Visit (INDEPENDENT_AMBULATORY_CARE_PROVIDER_SITE_OTHER)
Admission: RE | Admit: 2018-08-02 | Discharge: 2018-08-02 | Disposition: A | Discharge status: Home / Self Care | Payer: Medicare Other | Source: Ambulatory Visit | Attending: Internal Medicine | Admitting: Internal Medicine

## 2018-08-02 ENCOUNTER — Ambulatory Visit (INDEPENDENT_AMBULATORY_CARE_PROVIDER_SITE_OTHER): Payer: Medicare Other | Admitting: Internal Medicine

## 2018-08-02 ENCOUNTER — Encounter: Payer: Self-pay | Admitting: Internal Medicine

## 2018-08-02 ENCOUNTER — Other Ambulatory Visit (INDEPENDENT_AMBULATORY_CARE_PROVIDER_SITE_OTHER): Payer: Medicare Other

## 2018-08-02 ENCOUNTER — Other Ambulatory Visit: Payer: Self-pay

## 2018-08-02 VITALS — BP 142/96 | HR 98 | Temp 97.8°F | Ht 68.0 in | Wt 184.0 lb

## 2018-08-02 DIAGNOSIS — R1030 Lower abdominal pain, unspecified: Secondary | ICD-10-CM | POA: Diagnosis not present

## 2018-08-02 DIAGNOSIS — E785 Hyperlipidemia, unspecified: Secondary | ICD-10-CM

## 2018-08-02 DIAGNOSIS — R079 Chest pain, unspecified: Secondary | ICD-10-CM | POA: Diagnosis not present

## 2018-08-02 DIAGNOSIS — E119 Type 2 diabetes mellitus without complications: Secondary | ICD-10-CM

## 2018-08-02 LAB — CBC WITH DIFFERENTIAL/PLATELET
Basophils Absolute: 0 10*3/uL (ref 0.0–0.1)
Basophils Relative: 0.4 % (ref 0.0–3.0)
Eosinophils Absolute: 0.2 10*3/uL (ref 0.0–0.7)
Eosinophils Relative: 2.9 % (ref 0.0–5.0)
HCT: 42.2 % (ref 39.0–52.0)
Hemoglobin: 13.4 g/dL (ref 13.0–17.0)
Lymphocytes Relative: 31.4 % (ref 12.0–46.0)
Lymphs Abs: 1.7 10*3/uL (ref 0.7–4.0)
MCHC: 31.8 g/dL (ref 30.0–36.0)
MCV: 88.4 fl (ref 78.0–100.0)
Monocytes Absolute: 0.4 10*3/uL (ref 0.1–1.0)
Monocytes Relative: 8 % (ref 3.0–12.0)
Neutro Abs: 3.2 10*3/uL (ref 1.4–7.7)
Neutrophils Relative %: 57.3 % (ref 43.0–77.0)
Platelets: 171 10*3/uL (ref 150.0–400.0)
RBC: 4.77 Mil/uL (ref 4.22–5.81)
RDW: 14.4 % (ref 11.5–15.5)
WBC: 5.5 10*3/uL (ref 4.0–10.5)

## 2018-08-02 LAB — HEPATIC FUNCTION PANEL
ALT: 17 U/L (ref 0–53)
AST: 18 U/L (ref 0–37)
Albumin: 4.2 g/dL (ref 3.5–5.2)
Alkaline Phosphatase: 88 U/L (ref 39–117)
Bilirubin, Direct: 0.1 mg/dL (ref 0.0–0.3)
Total Bilirubin: 0.3 mg/dL (ref 0.2–1.2)
Total Protein: 7.8 g/dL (ref 6.0–8.3)

## 2018-08-02 LAB — BASIC METABOLIC PANEL
BUN: 14 mg/dL (ref 6–23)
CO2: 27 mEq/L (ref 19–32)
Calcium: 9.4 mg/dL (ref 8.4–10.5)
Chloride: 105 mEq/L (ref 96–112)
Creatinine, Ser: 1.1 mg/dL (ref 0.40–1.50)
GFR: 79.5 mL/min (ref >60.00)
Glucose, Bld: 115 mg/dL — ABNORMAL HIGH (ref 70–99)
Potassium: 3.8 mEq/L (ref 3.5–5.1)
Sodium: 137 mEq/L (ref 135–145)

## 2018-08-02 LAB — URINALYSIS, ROUTINE W REFLEX MICROSCOPIC
Bilirubin Urine: NEGATIVE
Hgb urine dipstick: NEGATIVE
Ketones, ur: NEGATIVE
Nitrite: NEGATIVE
RBC / HPF: NONE SEEN (ref 0–?)
Specific Gravity, Urine: 1.025 (ref 1.000–1.030)
Total Protein, Urine: NEGATIVE
Urine Glucose: NEGATIVE
Urobilinogen, UA: 1 (ref 0.0–1.0)
pH: 6 (ref 5.0–8.0)

## 2018-08-02 LAB — LIPASE: Lipase: 26 U/L (ref 11.0–59.0)

## 2018-08-02 NOTE — Patient Instructions (Addendum)
Your EKG is Ok today  Ebony to hold on taking the statin for now  Please continue all other medications as before, and refills have been done if requested.  Please have the pharmacy call with any other refills you may need.  Please continue your efforts at being more active, low cholesterol diet, and weight control..  Please keep your appointments with your specialists as you may have planned  Please go to the XRAY Department in the Basement (go straight as you get off the elevator) for the x-ray testing  Please go to the LAB in the Basement (turn left off the elevator) for the tests to be done today  You will be contacted by phone if any changes need to be made immediately.  Otherwise, you will receive a letter about your results with an explanation, but please check with MyChart first.  Please remember to sign up for MyChart if you have not done so, as this will be important to you in the future with finding out test results, communicating by private email, and scheduling acute appointments online when needed.

## 2018-08-02 NOTE — Assessment & Plan Note (Signed)
To hold on statin for now

## 2018-08-02 NOTE — Assessment & Plan Note (Signed)
Etiology unclear but suspect may in fact be due to the statin, to d/c statin for now, for xray/lab as ordered,  to f/u any worsening symptoms or concerns

## 2018-08-02 NOTE — Assessment & Plan Note (Signed)
ecg reviewed, low suspicion for cardiac,  For cxr with above, to f/u any worsening symptoms or concerns

## 2018-08-02 NOTE — Progress Notes (Signed)
Subjective:    Patient ID: Vincent [name], male    DOB: Jun 25, 1945, 73 y.o.   MRN: [id number]  HPI  Here to f/u with c/o chest and abd pain, starting at same time late Thursday last wk, describes as low mid abd dull without radiation, intermittent with nausea but no vomiting, fever, diarrhea or bleeding he is aware.  Chest symptoms is described as a tightness, without radiation, diaphoresis, sob, palps or dizziness.  Both occurred together from late thur to Monday am, and non in the last 2 days.  He believes was related to statin increase, and all seemed to resolve with stopping the statin on Saturday AM.  Pt denies orthopnea, PND, increased LE swelling.  Denies worsening reflux, dysphagia,, diarrhea or constipation.  Denies urinary symptoms such as dysuria, frequency, urgency, flank pain, hematuria  Past Medical History:  Diagnosis Date  . Allergic rhinitis, cause unspecified 02/08/2011  . Anemia, unspecified 04/11/2011  . 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 BPH (benign prostatic hyperplasia)   . BPH (benign prostatic hypertrophy) 02/08/2011  . Cataract    as a child, blind since 28 or 80 yrs old  . 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 Diabetes ([place])   . Elevated PSA 02/08/2011  . GERD (gastroesophageal reflux disease) 02/08/2011  . 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 History of prostatitis 04/11/2011  . Hyperlipidemia   . Kidney stone    passed stone - no surgery required  . Lumbar disc disease 02/06/2011   Past Surgical History:  Procedure Laterality Date  . BACK SURGERY     x 3  L4-L5  . COLONOSCOPY  2007   Dr Wynetta Emery  . CYST EXCISION    . WISDOM TOOTH EXTRACTION      reports that he quit smoking about 55 years ago. His smoking use included cigarettes. He has never used smokeless tobacco. He reports that he does not drink alcohol or use drugs. family history includes Blindness in an other family member; Diabetes in an other family member; Heart  disease in an other family member; Hypertension in an other family member; Mental illness in an other family member; Sudden death in an other family member. No Known Allergies Current Outpatient Medications on File Prior to Visit  Medication Sig Dispense Refill  . aspirin 81 MG EC tablet Take 1 tablet (81 mg total) by mouth daily. Swallow whole. 30 tablet 12  . finasteride (PROSCAR) 5 MG tablet TK 1 T PO QHS 90 tablet 3  . ibuprofen (ADVIL,MOTRIN) 800 MG tablet TAKE 1 TABLET(800 MG) BY MOUTH EVERY 8 HOURS AS NEEDED FOR PAIN 90 tablet 0  . lansoprazole (PREVACID) 30 MG capsule TAKE 1 CAPSULE(30 MG) BY MOUTH DAILY 90 capsule 0  . meloxicam (MOBIC) 7.5 MG tablet Take 1-2 tablets (7.5-15 mg total) by mouth daily. 60 tablet 0  . tamsulosin (FLOMAX) 0.4 MG CAPS capsule TK ONE C PO  QHS  8  . Vitamin D, Ergocalciferol, (DRISDOL) 1.25 MG (50000 UT) CAPS capsule Take 1 capsule (50,000 Units total) by mouth every 7 (seven) days. 12 capsule 0   No current facility-administered medications on file prior to visit.    Review of Systems  Constitutional: Negative for other unusual diaphoresis or sweats HENT: Negative for ear discharge or swelling Eyes: Negative for other worsening visual disturbances Respiratory: Negative for stridor or other swelling  Gastrointestinal: Negative for worsening distension or other blood Genitourinary: Negative for retention or other urinary change Musculoskeletal:  Negative for other MSK pain or swelling Skin: Negative for color change or other new lesions Neurological: Negative for worsening tremors and other numbness  Psychiatric/Behavioral: Negative for worsening agitation or other fatigue All other system neg per pt    Objective:   Physical Exam BP (!) 142/96   Pulse 98   Temp 97.8 F (36.6 C) (Oral)   Ht 5\' 8"  (1.727 m)   Wt 184 lb (83.5 kg)   SpO2 97%   BMI 27.98 kg/m  VS noted,  Constitutional: Pt appears in NAD HENT: Head: NCAT.  Right Ear: External ear  normal.  Left Ear: External ear normal.  Eyes: . Pupils are equal, round, and reactive to light. Conjunctivae and EOM are normal Nose: without d/c or deformity Neck: Neck supple. Gross normal ROM Cardiovascular: Normal rate and regular rhythm.   Pulmonary/Chest: Effort normal and breath sounds without rales or wheezing.  Abd:  Soft, NT, ND, + BS, no organomegaly Neurological: Pt is alert. At baseline orientation, motor grossly intact Skin: Skin is warm. No rashes, other new lesions, no LE edema Psychiatric: Pt behavior is normal without agitation  No other exam findings  ECG today I have personally interpreted:  NSR 74 no ischemic changes     Assessment & Plan:

## 2018-08-02 NOTE — Assessment & Plan Note (Signed)
stable overall by history and exam, recent data reviewed with pt, and pt to continue medical treatment as before,  to f/u any worsening symptoms or concerns  

## 2018-08-03 ENCOUNTER — Telehealth: Payer: Self-pay

## 2018-08-03 NOTE — Telephone Encounter (Signed)
Pt has viewed results via MyChart  

## 2018-08-03 NOTE — Telephone Encounter (Signed)
-----   Message from Biagio Borg, MD sent at 08/02/2018  8:45 PM EDT ----- Left message on MyChart, pt to cont same tx except  The test results show that your current treatment is OK, except for the finding of marked constipation.  Please start OTC Miralax 17 gm daily. Redmond Baseman to please inform pt

## 2018-09-20 ENCOUNTER — Other Ambulatory Visit: Payer: Self-pay | Admitting: Urology

## 2018-09-21 ENCOUNTER — Other Ambulatory Visit: Payer: Self-pay

## 2018-09-21 MED ORDER — LANSOPRAZOLE 30 MG PO CPDR: DELAYED_RELEASE_CAPSULE | ORAL | 0 refills | Status: DC

## 2018-10-24 ENCOUNTER — Encounter (HOSPITAL_BASED_OUTPATIENT_CLINIC_OR_DEPARTMENT_OTHER): Payer: Self-pay

## 2018-10-24 ENCOUNTER — Other Ambulatory Visit: Payer: Self-pay

## 2018-10-24 ENCOUNTER — Other Ambulatory Visit (HOSPITAL_COMMUNITY)
Admission: RE | Admit: 2018-10-24 | Discharge: 2018-10-24 | Disposition: A | Discharge status: Home / Self Care | Payer: Medicare Other | Source: Ambulatory Visit | Attending: Urology | Admitting: Urology

## 2018-10-24 DIAGNOSIS — Z01812 Encounter for preprocedural laboratory examination: Secondary | ICD-10-CM | POA: Insufficient documentation

## 2018-10-24 DIAGNOSIS — Z20828 Contact with and (suspected) exposure to other viral communicable diseases: Secondary | ICD-10-CM | POA: Insufficient documentation

## 2018-10-24 NOTE — Progress Notes (Signed)
Vincent [name] is blind Spoke with: Vincent [name] Gave NPO:  After Midnight, no gum, candy, or mints   Arrival time: 0715 AM Labs: Istat 8 AM medications: None Pre op orders: yes Ride home:  Vincent [name] (wife) [phone]

## 2018-10-24 NOTE — Progress Notes (Signed)
SPOKE W/  Shashank     SCREENING SYMPTOMS OF COVID 19:   COUGH--NO  RUNNY NOSE---NO   SORE THROAT---NO  NASAL CONGESTION----NO  SNEEZING----NO  SHORTNESS OF BREATH---NO  DIFFICULTY BREATHING---NO  TEMP >100.0 -----NO  UNEXPLAINED BODY ACHES------NO  CHILLS -------- NO  HEADACHES ---------NO  LOSS OF SMELL/ TASTE --------NO    HAVE YOU OR ANY FAMILY MEMBER TRAVELLED PAST 14 DAYS OUT OF THE   COUNTY--NO- STATE----NO COUNTRY----NO  HAVE YOU OR ANY FAMILY MEMBER BEEN EXPOSED TO ANYONE WITH COVID 19? NO

## 2018-10-25 LAB — NOVEL CORONAVIRUS, NAA (HOSP ORDER, SEND-OUT TO REF LAB; TAT 18-24 HRS): SARS-CoV-2, NAA: NOT DETECTED

## 2018-10-27 ENCOUNTER — Ambulatory Visit (HOSPITAL_BASED_OUTPATIENT_CLINIC_OR_DEPARTMENT_OTHER): Payer: Medicare Other | Admitting: Anesthesiology

## 2018-10-27 ENCOUNTER — Other Ambulatory Visit: Payer: Self-pay

## 2018-10-27 ENCOUNTER — Encounter (HOSPITAL_COMMUNITY)
Admission: RE | Disposition: A | Discharge status: Home / Self Care | Payer: Self-pay | Source: Home / Self Care | Attending: Urology

## 2018-10-27 ENCOUNTER — Observation Stay (HOSPITAL_BASED_OUTPATIENT_CLINIC_OR_DEPARTMENT_OTHER)
Admission: RE | Admit: 2018-10-27 | Discharge: 2018-10-28 | Disposition: A | Discharge status: Home / Self Care | Payer: Medicare Other | Attending: Urology | Admitting: Urology

## 2018-10-27 ENCOUNTER — Encounter (HOSPITAL_BASED_OUTPATIENT_CLINIC_OR_DEPARTMENT_OTHER): Payer: Self-pay

## 2018-10-27 DIAGNOSIS — Z79899 Other long term (current) drug therapy: Secondary | ICD-10-CM | POA: Insufficient documentation

## 2018-10-27 DIAGNOSIS — Z791 Long term (current) use of non-steroidal anti-inflammatories (NSAID): Secondary | ICD-10-CM | POA: Diagnosis not present

## 2018-10-27 DIAGNOSIS — E785 Hyperlipidemia, unspecified: Secondary | ICD-10-CM | POA: Diagnosis not present

## 2018-10-27 DIAGNOSIS — Z87891 Personal history of nicotine dependence: Secondary | ICD-10-CM | POA: Insufficient documentation

## 2018-10-27 DIAGNOSIS — M1710 Unilateral primary osteoarthritis, unspecified knee: Secondary | ICD-10-CM | POA: Diagnosis not present

## 2018-10-27 DIAGNOSIS — N401 Enlarged prostate with lower urinary tract symptoms: Principal | ICD-10-CM | POA: Insufficient documentation

## 2018-10-27 DIAGNOSIS — K219 Gastro-esophageal reflux disease without esophagitis: Secondary | ICD-10-CM | POA: Insufficient documentation

## 2018-10-27 DIAGNOSIS — Z87440 Personal history of urinary (tract) infections: Secondary | ICD-10-CM | POA: Insufficient documentation

## 2018-10-27 DIAGNOSIS — R3912 Poor urinary stream: Secondary | ICD-10-CM | POA: Insufficient documentation

## 2018-10-27 DIAGNOSIS — E119 Type 2 diabetes mellitus without complications: Secondary | ICD-10-CM | POA: Diagnosis not present

## 2018-10-27 DIAGNOSIS — H547 Unspecified visual loss: Secondary | ICD-10-CM | POA: Insufficient documentation

## 2018-10-27 DIAGNOSIS — Z7982 Long term (current) use of aspirin: Secondary | ICD-10-CM | POA: Diagnosis not present

## 2018-10-27 DIAGNOSIS — N138 Other obstructive and reflux uropathy: Secondary | ICD-10-CM | POA: Diagnosis present

## 2018-10-27 HISTORY — DX: Personal history of colon polyps, unspecified: Z86.0100

## 2018-10-27 HISTORY — PX: THULIUM LASER TURP (TRANSURETHRAL RESECTION OF PROSTATE): SHX6744

## 2018-10-27 HISTORY — DX: Diverticulosis of intestine, part unspecified, without perforation or abscess without bleeding: K57.90

## 2018-10-27 HISTORY — DX: Type 2 diabetes mellitus without complications: E11.9

## 2018-10-27 HISTORY — DX: Personal history of colonic polyps: Z86.010

## 2018-10-27 HISTORY — DX: Unspecified osteoarthritis, unspecified site: M19.90

## 2018-10-27 HISTORY — DX: Personal history of urinary calculi: Z87.442

## 2018-10-27 HISTORY — DX: Vitamin D deficiency, unspecified: E55.9

## 2018-10-27 HISTORY — DX: Presence of dental prosthetic device (complete) (partial): Z97.2

## 2018-10-27 LAB — POCT I-STAT, CHEM 8
BUN: 16 mg/dL (ref 8–23)
Calcium, Ion: 1.35 mmol/L (ref 1.15–1.40)
Chloride: 106 mmol/L (ref 98–111)
Creatinine, Ser: 0.9 mg/dL (ref 0.61–1.24)
Glucose, Bld: 125 mg/dL — ABNORMAL HIGH (ref 70–99)
HCT: 44 % (ref 39.0–52.0)
Hemoglobin: 15 g/dL (ref 13.0–17.0)
Potassium: 4.5 mmol/L (ref 3.5–5.1)
Sodium: 142 mmol/L (ref 135–145)
TCO2: 25 mmol/L (ref 22–32)

## 2018-10-27 LAB — CBC
HCT: 43.9 % (ref 39.0–52.0)
Hemoglobin: 13.6 g/dL (ref 13.0–17.0)
MCH: 28 pg (ref 26.0–34.0)
MCHC: 31 g/dL (ref 30.0–36.0)
MCV: 90.3 fL (ref 80.0–100.0)
Platelets: 180 10*3/uL (ref 150–400)
RBC: 4.86 MIL/uL (ref 4.22–5.81)
RDW: 13.2 % (ref 11.5–15.5)
WBC: 8.7 10*3/uL (ref 4.0–10.5)
nRBC: 0 % (ref 0.0–0.2)

## 2018-10-27 LAB — GLUCOSE, CAPILLARY: Glucose-Capillary: 167 mg/dL — ABNORMAL HIGH (ref 70–99)

## 2018-10-27 SURGERY — THULIUM LASER TURP (TRANSURETHRAL RESECTION OF PROSTATE)
Anesthesia: General | Site: Prostate

## 2018-10-27 MED ORDER — ONDANSETRON HCL 4 MG/2ML IJ SOLN
INTRAMUSCULAR | Status: AC
  Filled 2018-10-27: qty 2

## 2018-10-27 MED ORDER — ONDANSETRON HCL 4 MG/2ML IJ SOLN: 4.0000 mg | INTRAMUSCULAR | Status: DC | PRN

## 2018-10-27 MED ORDER — FENTANYL CITRATE (PF) 100 MCG/2ML IJ SOLN
INTRAMUSCULAR | Status: AC
  Filled 2018-10-27: qty 2

## 2018-10-27 MED ORDER — PHENYLEPHRINE 40 MCG/ML (10ML) SYRINGE FOR IV PUSH (FOR BLOOD PRESSURE SUPPORT)
PREFILLED_SYRINGE | INTRAVENOUS | Status: DC | PRN
  Administered 2018-10-27: 40 ug via INTRAVENOUS

## 2018-10-27 MED ORDER — SODIUM CHLORIDE 0.9% FLUSH: 3.0000 mL | INTRAVENOUS | Status: DC | PRN

## 2018-10-27 MED ORDER — PROPOFOL 10 MG/ML IV BOLUS
INTRAVENOUS | Status: DC | PRN
  Administered 2018-10-27: 150 mg via INTRAVENOUS
  Administered 2018-10-27: 50 mg via INTRAVENOUS
  Administered 2018-10-27: 30 mg via INTRAVENOUS

## 2018-10-27 MED ORDER — ACETAMINOPHEN 10 MG/ML IV SOLN
1000.0000 mg | Freq: Once | INTRAVENOUS | Status: DC | PRN
  Filled 2018-10-27: qty 100

## 2018-10-27 MED ORDER — SODIUM CHLORIDE 0.9 % IR SOLN: 3000.0000 mL | Status: DC

## 2018-10-27 MED ORDER — ACETAMINOPHEN 325 MG PO TABS: 650.0000 mg | ORAL_TABLET | ORAL | Status: DC | PRN

## 2018-10-27 MED ORDER — HYDRALAZINE HCL 20 MG/ML IJ SOLN
INTRAMUSCULAR | Status: DC | PRN
  Administered 2018-10-27: 10 mg via INTRAVENOUS

## 2018-10-27 MED ORDER — ZOLPIDEM TARTRATE 5 MG PO TABS: 5.0000 mg | ORAL_TABLET | Freq: Every evening | ORAL | Status: DC | PRN

## 2018-10-27 MED ORDER — PANTOPRAZOLE SODIUM 40 MG PO TBEC
40.0000 mg | DELAYED_RELEASE_TABLET | Freq: Every day | ORAL | Status: DC
  Administered 2018-10-27 – 2018-10-28 (×2): 40 mg via ORAL
  Filled 2018-10-27 (×2): qty 1

## 2018-10-27 MED ORDER — BELLADONNA ALKALOIDS-OPIUM 16.2-60 MG RE SUPP
RECTAL | Status: AC
  Filled 2018-10-27: qty 1

## 2018-10-27 MED ORDER — DEXAMETHASONE SODIUM PHOSPHATE 10 MG/ML IJ SOLN
INTRAMUSCULAR | Status: AC
  Filled 2018-10-27: qty 1

## 2018-10-27 MED ORDER — FINASTERIDE 5 MG PO TABS
5.0000 mg | ORAL_TABLET | Freq: Every day | ORAL | Status: DC
  Administered 2018-10-27: 5 mg via ORAL
  Filled 2018-10-27: qty 1

## 2018-10-27 MED ORDER — LACTATED RINGERS IV SOLN
INTRAVENOUS | Status: DC
  Administered 2018-10-27: 1000 mL via INTRAVENOUS
  Administered 2018-10-27: 11:00:00 via INTRAVENOUS
  Filled 2018-10-27: qty 1000

## 2018-10-27 MED ORDER — SODIUM CHLORIDE 0.9 % IR SOLN
Status: DC | PRN
  Administered 2018-10-27: 12000 mL

## 2018-10-27 MED ORDER — OXYCODONE HCL 5 MG PO TABS
5.0000 mg | ORAL_TABLET | Freq: Once | ORAL | Status: DC | PRN
  Filled 2018-10-27: qty 1

## 2018-10-27 MED ORDER — LIDOCAINE HCL (CARDIAC) PF 100 MG/5ML IV SOSY
PREFILLED_SYRINGE | INTRAVENOUS | Status: DC | PRN
  Administered 2018-10-27: 60 mg via INTRAVENOUS

## 2018-10-27 MED ORDER — PROPOFOL 10 MG/ML IV BOLUS
INTRAVENOUS | Status: AC
  Filled 2018-10-27: qty 20

## 2018-10-27 MED ORDER — BELLADONNA ALKALOIDS-OPIUM 16.2-60 MG RE SUPP
RECTAL | Status: DC | PRN
  Administered 2018-10-27: 1 via RECTAL

## 2018-10-27 MED ORDER — SODIUM CHLORIDE 0.9 % IR SOLN
Status: DC | PRN
  Administered 2018-10-27: 15000 mL

## 2018-10-27 MED ORDER — ACETAMINOPHEN 500 MG PO TABS
1000.0000 mg | ORAL_TABLET | Freq: Once | ORAL | Status: DC | PRN
  Filled 2018-10-27: qty 2

## 2018-10-27 MED ORDER — FENTANYL CITRATE (PF) 100 MCG/2ML IJ SOLN
INTRAMUSCULAR | Status: DC | PRN
  Administered 2018-10-27 (×8): 25 ug via INTRAVENOUS

## 2018-10-27 MED ORDER — FENTANYL CITRATE (PF) 100 MCG/2ML IJ SOLN
25.0000 ug | INTRAMUSCULAR | Status: DC | PRN
  Filled 2018-10-27: qty 1

## 2018-10-27 MED ORDER — ACETAMINOPHEN 160 MG/5ML PO SOLN
1000.0000 mg | Freq: Once | ORAL | Status: DC | PRN
  Filled 2018-10-27: qty 40.6

## 2018-10-27 MED ORDER — ROSUVASTATIN CALCIUM 20 MG PO TABS
20.0000 mg | ORAL_TABLET | Freq: Every evening | ORAL | Status: DC
  Administered 2018-10-27: 20 mg via ORAL
  Filled 2018-10-27: qty 1

## 2018-10-27 MED ORDER — CEPHALEXIN 500 MG PO CAPS
500.0000 mg | ORAL_CAPSULE | Freq: Every day | ORAL | Status: DC
  Administered 2018-10-27: 500 mg via ORAL
  Filled 2018-10-27: qty 1

## 2018-10-27 MED ORDER — ONDANSETRON HCL 4 MG/2ML IJ SOLN
INTRAMUSCULAR | Status: DC | PRN
  Administered 2018-10-27: 4 mg via INTRAVENOUS

## 2018-10-27 MED ORDER — CEPHALEXIN 500 MG PO CAPS: 500.0000 mg | ORAL_CAPSULE | Freq: Every day | ORAL | 0 refills | Status: DC

## 2018-10-27 MED ORDER — OXYCODONE HCL 5 MG/5ML PO SOLN
5.0000 mg | Freq: Once | ORAL | Status: DC | PRN
  Filled 2018-10-27: qty 5

## 2018-10-27 MED ORDER — HYDRALAZINE HCL 20 MG/ML IJ SOLN
INTRAMUSCULAR | Status: AC
  Filled 2018-10-27: qty 1

## 2018-10-27 MED ORDER — PHENYLEPHRINE 40 MCG/ML (10ML) SYRINGE FOR IV PUSH (FOR BLOOD PRESSURE SUPPORT)
PREFILLED_SYRINGE | INTRAVENOUS | Status: AC
  Filled 2018-10-27: qty 10

## 2018-10-27 MED ORDER — LABETALOL HCL 5 MG/ML IV SOLN
INTRAVENOUS | Status: DC | PRN
  Administered 2018-10-27: 5 mg via INTRAVENOUS

## 2018-10-27 MED ORDER — LABETALOL HCL 5 MG/ML IV SOLN
INTRAVENOUS | Status: AC
  Filled 2018-10-27: qty 4

## 2018-10-27 MED ORDER — DEXAMETHASONE SODIUM PHOSPHATE 4 MG/ML IJ SOLN
INTRAMUSCULAR | Status: DC | PRN
  Administered 2018-10-27: 10 mg via INTRAVENOUS

## 2018-10-27 MED ORDER — LIDOCAINE 2% (20 MG/ML) 5 ML SYRINGE
INTRAMUSCULAR | Status: AC
  Filled 2018-10-27: qty 5

## 2018-10-27 MED ORDER — DOCUSATE SODIUM 100 MG PO CAPS
100.0000 mg | ORAL_CAPSULE | Freq: Two times a day (BID) | ORAL | Status: DC
  Administered 2018-10-27 – 2018-10-28 (×2): 100 mg via ORAL
  Filled 2018-10-27 (×2): qty 1

## 2018-10-27 MED ORDER — CEFAZOLIN SODIUM-DEXTROSE 2-4 GM/100ML-% IV SOLN
INTRAVENOUS | Status: AC
  Filled 2018-10-27: qty 100

## 2018-10-27 MED ORDER — BACITRACIN-NEOMYCIN-POLYMYXIN 400-5-5000 EX OINT: 1.0000 "application " | TOPICAL_OINTMENT | Freq: Three times a day (TID) | CUTANEOUS | Status: DC | PRN

## 2018-10-27 MED ORDER — CEFAZOLIN SODIUM-DEXTROSE 2-4 GM/100ML-% IV SOLN
2.0000 g | Freq: Once | INTRAVENOUS | Status: AC
  Administered 2018-10-27: 2 g via INTRAVENOUS
  Filled 2018-10-27: qty 100

## 2018-10-27 MED ORDER — SODIUM CHLORIDE 0.9 % IV SOLN: 250.0000 mL | INTRAVENOUS | Status: DC | PRN

## 2018-10-27 MED ORDER — OXYCODONE HCL 5 MG PO TABS
5.0000 mg | ORAL_TABLET | ORAL | Status: DC | PRN
  Administered 2018-10-27: 5 mg via ORAL
  Filled 2018-10-27: qty 1

## 2018-10-27 SURGICAL SUPPLY — 26 items
BAG DRAIN URO-CYSTO SKYTR STRL (DRAIN) ×3 IMPLANT
BAG DRN UROCATH (DRAIN) ×1
BAG URINE DRAINAGE (UROLOGICAL SUPPLIES) ×3 IMPLANT
CATH COUDE FOLEY 2W 5CC 18FR (CATHETERS) IMPLANT
CATH FOLEY 3WAY 30CC 22F (CATHETERS) IMPLANT
CATH FOLEY 3WAY 30CC 24FR (CATHETERS) ×3
CATH URTH STD 24FR FL 3W 2 (CATHETERS) IMPLANT
CLOTH BEACON ORANGE TIMEOUT ST (SAFETY) ×3 IMPLANT
EVACUATOR MICROVAS BLADDER (UROLOGICAL SUPPLIES) ×2 IMPLANT
GLOVE BIO SURGEON STRL SZ7.5 (GLOVE) ×3 IMPLANT
GLOVE BIO SURGEON STRL SZ8 (GLOVE) ×2 IMPLANT
GOWN STRL REUS W/TWL XL LVL3 (GOWN DISPOSABLE) ×3 IMPLANT
HOLDER FOLEY CATH W/STRAP (MISCELLANEOUS) ×2 IMPLANT
IV NS IRRIG 3000ML ARTHROMATIC (IV SOLUTION) ×27 IMPLANT
KIT TURNOVER CYSTO (KITS) ×3 IMPLANT
LASER REVOLIX PROCEDURE (MISCELLANEOUS) ×3 IMPLANT
LOOP CUT BIPOLAR 24F LRG (ELECTROSURGICAL) ×2 IMPLANT
MANIFOLD NEPTUNE II (INSTRUMENTS) ×3 IMPLANT
NS IRRIG 1000ML POUR BTL (IV SOLUTION) ×2 IMPLANT
NS IRRIG 500ML POUR BTL (IV SOLUTION) ×2 IMPLANT
PACK CYSTO (CUSTOM PROCEDURE TRAY) ×3 IMPLANT
SYR 30ML LL (SYRINGE) ×2 IMPLANT
TUBE CONNECTING 12'X1/4 (SUCTIONS) ×1
TUBE CONNECTING 12X1/4 (SUCTIONS) ×1 IMPLANT
TUBING UROLOGY SET (TUBING) ×2 IMPLANT
WATER STERILE IRR 500ML POUR (IV SOLUTION) ×2 IMPLANT

## 2018-10-27 NOTE — Anesthesia Preprocedure Evaluation (Signed)
Anesthesia Evaluation  Patient identified by MRN, date of birth, ID band Patient awake    Reviewed: Allergy & Precautions, NPO status , Patient's Chart, lab work & pertinent test results  History of Anesthesia Complications Negative for: history of anesthetic complications  Airway Mallampati: II  TM Distance: >3 FB Neck ROM: Full    Dental  (+) Partial Lower, Partial Upper,    Pulmonary neg shortness of breath, neg COPD, neg recent URI, former smoker,    breath sounds clear to auscultation       Cardiovascular negative cardio ROS   Rhythm:Regular     Neuro/Psych negative neurological ROS  negative psych ROS   GI/Hepatic GERD  Medicated and Controlled,(+) Hepatitis -, B  Endo/Other  diabetes  Renal/GU      Musculoskeletal   Abdominal   Peds  Hematology  (+) anemia ,   Anesthesia Other Findings blind  Reproductive/Obstetrics                             Anesthesia Physical Anesthesia Plan  ASA: II  Anesthesia Plan: General   Post-op Pain Management:    Induction: Intravenous  PONV Risk Score and Plan: 2 and Ondansetron and Dexamethasone  Airway Management Planned: LMA and Oral ETT  Additional Equipment: None  Intra-op Plan:   Post-operative Plan: Extubation in OR  Informed Consent: I have reviewed the patients History and Physical, chart, labs and discussed the procedure including the risks, benefits and alternatives for the proposed anesthesia with the patient or authorized representative who has indicated his/her understanding and acceptance.     Dental advisory given  Plan Discussed with: CRNA and Surgeon  Anesthesia Plan Comments:         Anesthesia Quick Evaluation

## 2018-10-27 NOTE — Op Note (Signed)
Preoperative diagnosis: BPH with lower urinary tract symptoms, weak stream, incomplete emptying Postoperative diagnosis: Same  Procedure: Thulium laser vaporization of the prostate converted to transurethral resection of prostate  Surgeon: Junious Silk  Anesthesia: General  Indication for procedure: Vincent [name] is a 73 year old male with a history of BPH on tamsulosin and finasteride.  He had a history of UTI and incomplete bladder emptying as well as persistent lower urinary tract symptoms and was brought for the above procedure.  Findings: On exam under anesthesia the penis was circumcised and without mass or lesion.  The testicles were descended bilaterally and palpably normal.  On digital rectal exam the prostate was about 50 g and smooth without hard area or nodule.  On cystoscopy there was a short filmy bulbar urethral stricture dilated with the scope.  The prostate was obstructive primarily with a large median lobe.  There was moderate bladder trabeculation without stone or foreign body in the bladder.  Because of the large UO it took some time to find the ureteral orifice ease which were found after the 5 and 7:00 incisions.  The prostate was very oozy not only that the laser in the loop had trouble cauterizing the vessels but the density of vessels along the mucosa and superficial tissue was impressive.  Possible combination of his aspirin and NSAID use.  An excellent resection of the median lobe and channel was made.  All chips evacuated.  No resection distal to the verumontanum and the ureteral orifice ease were visualized at the end of the case and noted to be normal.  Description of procedure: After consent was obtained the patient brought to the operating room.  After adequate anesthesia he was placed in lithotomy position and prepped and draped in the usual sterile fashion.  A timeout was performed to confirm the patient and procedure.  An exam under anesthesia was performed.  The laser  continuous flow sheath was passed per urethra and the bladder inspected.  I initially could not locate the ureteral orifice ease but did feel comfortable I could see the trigone and floor the bladder and therefore used the 1000 m laser fiber to make an incision at 5:00 and 7:00 lateral to the median lobe and brought this down toward the verumontanum on both sides.  I was able to get down to the bladder neck and the bladder floor and then look and found both ureteral orifice ease once had made some space.  The orifice ease were marked with the laser coag.  The mucosa and prostatic tissue had a density of blood vessels that the laser could just not adequately coagulated and visualization was different.  After making the 5 and 7:00 incisions and then lasering about half of the median lobe I was not progressing as quickly as I thought it should go and was having trouble with visualization and therefore switched to the bipolar resectoscope with the loop.  I did not feel like the bleeding was clinically significant but it was enough that it was making visualization difficult endoscopically with the laser continuous flow sheath.  The continuous flow sheath was passed with the visual obturator and then the loop and handle were passed.  I went back to my 5:00 incisions and deepened those just slightly on both sides and brought this down to the verumontanum.  I then resected the remainder of the median lobe.  This created an excellent channel but I went on the right side bladder neck to verumontanum anterior to posterior and on  the left side anterior to posterior bladder neck down to the verumontanum and resected the lateral lobes.  At the end of the case hemostasis was excellent utilizing the bipolar.  There was an excellent channel.  The verumontanum and the ureteral orifice ease were again inspected and noted to be normal.  I evacuated all the chips with a E look evacuator.  Inspection again revealed excellent hemostasis  and a great channel.  I think he will do well.  The scope was removed and then a 24 [place] three-way catheter was advanced.  30 cc placed in the balloon and then it was seated at the bladder neck.  I started the patient on CBI and it was running clear on a moderate drip.  He was awakened and taken recovery room in stable condition.  Complications: None  Blood loss: 150 mL  Specimens: TURP chips to pathology  Drains: 24 French three-way catheter  Disposition: Patient stable to PACU

## 2018-10-27 NOTE — H&P (Signed)
H&P  Chief Complaint: BPH  History of Present Illness: Vincent [name] is a 73 year old male with a history of BPH finasteride and tamsulosin use.  He underwent office cystoscopy due to continued lower urinary tract symptoms and incomplete emptying.  He also has a history of urinary tract infection.  Prostate obstruction and a median lobe enlargement were visualized he was brought today for thulium laser vaporization of the prostate.  He has a prior history of PSA up to 14 which decreased to 7 on finasteride with a negative biopsy.  His urinalysis was negative.  He is well today without dysuria or fever.  Past Medical History:  Diagnosis Date  . Allergic rhinitis, cause unspecified 02/08/2011  . Anemia, unspecified 04/11/2011  . Arthritis    knee  . 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 BPH (benign prostatic hypertrophy) 02/08/2011  . Cataract    as a child, blind since 79 or 62 yrs old  . 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 Diabetes mellitus type 2, diet-controlled ([place])   . Diverticulosis   . Elevated PSA 02/08/2011  . GERD (gastroesophageal reflux disease) 02/08/2011  . 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/2011   pt unaware  . History of colon polyps   . History of kidney stones   . History of prostatitis 04/11/2011  . Hyperlipidemia   . Lumbar disc disease 02/06/2011  . Vitamin D deficiency   . Wears partial dentures    upper and lower   Past Surgical History:  Procedure Laterality Date  . BACK SURGERY     x 3  L4-L5  . COLONOSCOPY  2007   Dr Wynetta Emery  . CYST EXCISION    . WISDOM TOOTH EXTRACTION      Home Medications:  Medications Prior to Admission  Medication Sig Dispense Refill Last Dose  . finasteride (PROSCAR) 5 MG tablet TK 1 T PO QHS 90 tablet 3 10/26/2018 at Unknown time  . rosuvastatin (CRESTOR) 20 MG tablet Take 20 mg by mouth every evening.   10/26/2018 at Unknown time  . Vitamin D, Ergocalciferol, (DRISDOL) 1.25 MG (50000 UT) CAPS  capsule Take 1 capsule (50,000 Units total) by mouth every 7 (seven) days. 12 capsule 0 Past Month at Unknown time  . aspirin 81 MG EC tablet Take 1 tablet (81 mg total) by mouth daily. Swallow whole. 30 tablet 12 10/22/2018  . ibuprofen (ADVIL,MOTRIN) 800 MG tablet TAKE 1 TABLET(800 MG) BY MOUTH EVERY 8 HOURS AS NEEDED FOR PAIN 90 tablet 0 More than a month at Unknown time  . lansoprazole (PREVACID) 30 MG capsule TAKE 1 CAPSULE(30 MG) BY MOUTH DAILY (Patient taking differently: every evening. TAKE 1 CAPSULE(30 MG) BY MOUTH DAILY) 90 capsule 0 More than a month at Unknown time  . meloxicam (MOBIC) 7.5 MG tablet Take 1-2 tablets (7.5-15 mg total) by mouth daily. 60 tablet 0 More than a month at Unknown time  . tamsulosin (FLOMAX) 0.4 MG CAPS capsule every evening.   8 10/23/2018   Allergies: No Known Allergies  Family History  Problem Relation Age of Onset  . Blindness Other   . Heart disease Other   . Hypertension Other   . Diabetes Other   . Mental illness Other   . Sudden death Other   . Colon cancer Neg Hx   . Esophageal cancer Neg Hx   . Stomach cancer Neg Hx   . Rectal cancer Neg Hx    Social History:  reports  that he quit smoking about 55 years ago. His smoking use included cigarettes. He has never used smokeless tobacco. He reports that he does not drink alcohol or use drugs.  ROS: A complete review of systems was performed.  All systems are negative except for pertinent findings as noted. Review of Systems  All other systems reviewed and are negative.    Physical Exam:  Vital signs in last 24 hours: Temp:  [97.9 F (36.6 C)] 97.9 F (36.6 C) (09/25 0837) Pulse Rate:  [67] 67 (09/25 0837) Resp:  [16] 16 (09/25 0837) BP: (172)/(98) 172/98 (09/25 0837) SpO2:  [100 %] 100 % (09/25 0837) Weight:  [79.3 kg] 79.3 kg (09/25 0837) General:  Alert and oriented, No acute distress HEENT: Normocephalic, atraumatic Cardiovascular: Regular rate and rhythm Lungs: Regular rate and  effort Abdomen: Soft, nontender, nondistended, no abdominal masses Back: No CVA tenderness Extremities: No edema Neurologic: Grossly intact  Laboratory Data:  Results for orders placed or performed during the hospital encounter of 10/27/18 (from the past 24 hour(s))  I-STAT, chem 8     Status: Abnormal   Collection Time: 10/27/18  8:44 AM  Result Value Ref Range   Sodium 142 135 - 145 mmol/L   Potassium 4.5 3.5 - 5.1 mmol/L   Chloride 106 98 - 111 mmol/L   BUN 16 8 - 23 mg/dL   Creatinine, Ser 0.90 0.61 - 1.24 mg/dL   Glucose, Bld 125 (H) 70 - 99 mg/dL   Calcium, Ion 1.35 1.15 - 1.40 mmol/L   TCO2 25 22 - 32 mmol/L   Hemoglobin 15.0 13.0 - 17.0 g/dL   HCT 44.0 39.0 - 52.0 %   Recent Results (from the past 240 hour(s))  Novel Coronavirus, NAA (Hosp order, Send-out to Ref Lab; TAT 18-24 hrs     Status: None   Collection Time: 10/24/18 11:06 AM   Specimen: Nasopharyngeal Swab; Respiratory  Result Value Ref Range Status   SARS-CoV-2, NAA NOT DETECTED NOT DETECTED Final    Comment: (NOTE) This nucleic acid amplification test was developed and its performance characteristics determined by [employer]. Nucleic acid amplification tests include PCR and TMA. This test has not been FDA cleared or approved. This test has been authorized by FDA under an Emergency Use Authorization (EUA). This test is only authorized for the duration of time the declaration that circumstances exist justifying the authorization of the emergency use of in vitro diagnostic tests for detection of SARS-CoV-2 virus and/or diagnosis of COVID-19 infection under section 564(b)(1) of the Act, 21 U.S.C. [id number]) (1), unless the authorization is terminated or revoked sooner. When diagnostic testing is negative, the possibility of a false negative result should be considered in the context of a patient's recent exposures and the presence of clinical signs and symptoms consistent with COVID-19. An  individual without symptoms of COVID- 19 and who is not shedding SARS-CoV-2 vi rus would expect to have a negative (not detected) result in this assay. Performed At: [facility] [address] [place], [place] [id number] Rush Farmer MD [id number]    [place]  Final    Comment: Performed at [facility], [place] [address] [place], [place] [address]   Creatinine: Recent Labs    10/27/18 0844  CREATININE 0.90    Impression/Assessment:  Bph, luts --   Plan:  I discussed with the patient the nature, potential benefits, risks and alternatives to thulium laser vaporization prostate possible TURP, including side effects of the proposed treatment,  the likelihood of the patient achieving the goals of the procedure, and any potential problems that might occur during the procedure or recuperation.  We discussed again with the patient and his wife how flow symptoms typically improve and in most cases frequency and urgency will decrease.  However frequency and urgency can remain the same or worsen.  We discussed other risks such as bleeding and stricture and incontinence among others.  All questions answered. Patient elects to proceed.  Discussed Foley catheter care postop.   Festus Aloe 10/27/2018, 9:18 AM

## 2018-10-27 NOTE — Anesthesia Procedure Notes (Signed)
Procedure Name: LMA Insertion Date/Time: 10/27/2018 9:54 AM Performed by: Justice Rocher, CRNA Pre-anesthesia Checklist: Patient identified, Emergency Drugs available, Suction available and Patient being monitored Patient Re-evaluated:Patient Re-evaluated prior to induction Oxygen Delivery Method: Circle system utilized Preoxygenation: Pre-oxygenation with 100% oxygen Induction Type: IV induction Ventilation: Mask ventilation without difficulty LMA: LMA inserted LMA Size: 5.0 Number of attempts: 1 Airway Equipment and Method: Bite block Placement Confirmation: positive ETCO2 and breath sounds checked- equal and bilateral Tube secured with: Tape Dental Injury: Teeth and Oropharynx as per pre-operative assessment

## 2018-10-27 NOTE — Transfer of Care (Signed)
Immediate Anesthesia Transfer of Care Note  Patient: Vincent [name]  Procedure(s) Performed: Marcelino Duster LASER TURP (TRANSURETHRAL RESECTION OF PROSTATE) (N/A Prostate)  Patient Location: PACU  Anesthesia Type:General  Level of Consciousness: drowsy  Airway & Oxygen Therapy: Patient Spontanous Breathing and Patient connected to nasal cannula oxygen  Post-op Assessment: Report given to RN  Post vital signs: Reviewed and stable  Last Vitals:  Vitals Value Taken Time  BP 142/97 10/27/18 1215  Temp    Pulse 97 10/27/18 1216  Resp 15 10/27/18 1216  SpO2 100 % 10/27/18 1216  Vitals shown include unvalidated device data.  Last Pain:  Vitals:   10/27/18 0837  TempSrc: Oral  PainSc: 0-No pain      Patients Stated Pain Goal: 4 ([id number])  Complications: No apparent anesthesia complications

## 2018-10-27 NOTE — Discharge Instructions (Signed)
Indwelling Urinary Catheter Care, Adult °An indwelling urinary catheter is a thin tube that is put into your bladder. The tube helps to drain pee (urine) out of your body. The tube goes in through your urethra. Your urethra is where pee comes out of your body. Your pee will come out through the catheter, then it will go into a bag (drainage bag). °Take good care of your catheter so it will work well. °How to wear your catheter and bag °Supplies needed °· Sticky tape (adhesive tape) or a leg strap. °· Alcohol wipe or soap and water (if you use tape). °· A clean towel (if you use tape). °· Large overnight bag. °· Smaller bag (leg bag). °Wearing your catheter °Attach your catheter to your leg with tape or a leg strap. °· Make sure the catheter is not pulled tight. °· If a leg strap gets wet, take it off and put on a dry strap. °· If you use tape to hold the bag on your leg: °1. Use an alcohol wipe or soap and water to wash your skin where the tape made it sticky before. °2. Use a clean towel to pat-dry that skin. °3. Use new tape to make the bag stay on your leg. °Wearing your bags °You should have been given a large overnight bag. °· You may wear the overnight bag in the day or night. °· Always have the overnight bag lower than your bladder.  Do not let the bag touch the floor. °· Before you go to sleep, put a clean plastic bag in a wastebasket. Then hang the overnight bag inside the wastebasket. °You should also have a smaller leg bag that fits under your clothes. °· Always wear the leg bag below your knee. °· Do not wear your leg bag at night. °How to care for your skin and catheter °Supplies needed °· A clean washcloth. °· Water and mild soap. °· A clean towel. °Caring for your skin and catheter ° °  ° °· Clean the skin around your catheter every day: °? Wash your hands with soap and water. °? Wet a clean washcloth in warm water and mild soap. °? Clean the skin around your urethra. °? If you are male: °? Gently  spread the folds of skin around your vagina (labia). °? With the washcloth in your other hand, wipe the inner side of your labia on each side. Wipe from front to back. °? If you are male: °? Pull back any skin that covers the end of your penis (foreskin). °? With the washcloth in your other hand, wipe your penis in small circles. Start wiping at the tip of your penis, then move away from the catheter. °? Move the foreskin back in place, if needed. °? With your free hand, hold the catheter close to where it goes into your body. °? Keep holding the catheter during cleaning so it does not get pulled out. °? With the washcloth in your other hand, clean the catheter. °? Only wipe downward on the catheter. °? Do not wipe upward toward your body. Doing this may push germs into your urethra and cause infection. °? Use a clean towel to pat-dry the catheter and the skin around it. Make sure to wipe off all soap. °? Wash your hands with soap and water. °· Shower every day. Do not take baths. °· Do not use cream, ointment, or lotion on the area where the catheter goes into your body, unless your doctor tells you   to. °· Do not use powders, sprays, or lotions on your genital area. °· Check your skin around the catheter every day for signs of infection. Check for: °? Redness, swelling, or pain. °? Fluid or blood. °? Warmth. °? Pus or a bad smell. °How to empty the bag °Supplies needed °· Rubbing alcohol. °· Gauze pad or cotton ball. °· Tape or a leg strap. °Emptying the bag °Pour the pee out of your bag when it is ?-½ full, or at least 2-3 times a day. Do this for your overnight bag and your leg bag. °1. Wash your hands with soap and water. °2. Separate (detach) the bag from your leg. °3. Hold the bag over the toilet or a clean pail. Keep the bag lower than your hips and bladder. This is so the pee (urine) does not go back into the tube. °4. Open the pour spout. It is at the bottom of the bag. °5. Empty the pee into the toilet or  pail. Do not let the pour spout touch any surface. °6. Put rubbing alcohol on a gauze pad or cotton ball. °7. Use the gauze pad or cotton ball to clean the pour spout. °8. Close the pour spout. °9. Attach the bag to your leg with tape or a leg strap. °10. Wash your hands with soap and water. °Follow instructions for cleaning the drainage bag: °· From the product maker. °· As told by your doctor. °How to change the bag °Supplies needed °· Alcohol wipes. °· A clean bag. °· Tape or a leg strap. °Changing the bag °Replace your bag when it starts to leak, smell bad, or look dirty. °1. Wash your hands with soap and water. °2. Separate the dirty bag from your leg. °3. Pinch the catheter with your fingers so that pee does not spill out. °4. Separate the catheter tube from the bag tube where these tubes connect (at the connection valve). Do not let the tubes touch any surface. °5. Clean the end of the catheter tube with an alcohol wipe. Use a different alcohol wipe to clean the end of the bag tube. °6. Connect the catheter tube to the tube of the clean bag. °7. Attach the clean bag to your leg with tape or a leg strap. Do not make the bag tight on your leg. °8. Wash your hands with soap and water. °General rules ° °· Never pull on your catheter. Never try to take it out. Doing that can hurt you. °· Always wash your hands before and after you touch your catheter or bag. Use a mild, fragrance-free soap. If you do not have soap and water, use hand sanitizer. °· Always make sure there are no twists or bends (kinks) in the catheter tube. °· Always make sure there are no leaks in the catheter or bag. °· Drink enough fluid to keep your pee pale yellow. °· Do not take baths, swim, or use a hot tub. °· If you are male, wipe from front to back after you poop (have a bowel movement). °Contact a doctor if: °· Your pee is cloudy. °· Your pee smells worse than usual. °· Your catheter gets clogged. °· Your catheter leaks. °· Your bladder  feels full. °Get help right away if: °· You have redness, swelling, or pain where the catheter goes into your body. °· You have fluid, blood, pus, or a bad smell coming from the area where the catheter goes into your body. °· Your skin feels warm where   the catheter goes into your body.  You have a fever.  You have pain in your: ? Belly (abdomen). ? Legs. ? Lower back. ? Bladder.  You see blood in the catheter.  Your pee is pink or red.  You feel sick to your stomach (nauseous).  You throw up (vomit).  You have chills.  Your pee is not draining into the bag.  Your catheter gets pulled out. Summary  An indwelling urinary catheter is a thin tube that is placed into the bladder to help drain pee (urine) out of the body.  The catheter is placed into the part of the body that drains pee from the bladder (urethra).  Taking good care of your catheter will keep it working properly and help prevent problems.  Always wash your hands before and after touching your catheter or bag.  Never pull on your catheter or try to take it out. This information is not intended to replace advice given to you by your health care provider. Make sure you discuss any questions you have with your health care provider. Document Released: 05/15/2012 Document Revised: 05/12/2018 Document Reviewed: 09/03/2016 Elsevier Patient Education  [place], Resection Surgery, Care After  This sheet gives you information about how to care for yourself after your procedure. Your health care provider may also give you more specific instructions. If you have problems or questions, contact your health care provider. What can I expect after the procedure? For the first few weeks after the procedure:  You will feel a need to urinate often.  You may have blood in your urine.  You may feel a sudden need to urinate. Once your urinary catheter is removed, you may have a burning feeling when you  urinate, especially at the end of urination. This feeling usually passes within 3-5 days. Follow these instructions at home: Activity  Return to your normal activities as told by your health care provider. Ask your health care provider what activities are safe for you.  Do not do vigorous exercise for 1 week or as told by your health care provider.  Do not lift anything that is heavier than 10 lb (4.5 kg) until your health care provider say it is safe.  Avoid sexual activity for 4-6 weeks or as told by your health care provider.  Do not ride in a car for extended periods of time for 1 month or as told by your health care provider.  Do not drive for 24 hours if you were given a medicine to help you relax (sedative). Diet  Eat foods that are high in fiber, such as fresh fruits and vegetables, whole grains, and beans.  Drink enough fluid to keep your urine clear or pale yellow. Medicines  Take over-the-counter and prescription medicines, including stool softeners, only as told by your health care provider.  If you were prescribed an antibiotic medicine, take it as told by your health care provider. Do not stop taking the antibiotic even if you start to feel better. General instructions   If you were given elastic support stockings, wear them as told by your health care provider.  Do not strain to have a bowel movement. Straining may lead to bleeding from the prostate and cause clots to form and cause trouble urinating.  Keep all follow-up visits as told by your health care provider. This is important. Contact a health care provider if:  You have a fever or chills.  You have spasms or  pain with the urinary catheter still in place.  Once the catheter has been removed, you experience difficulty starting your stream when attempting to urinate. Get help right away if:  There is a blockage in your catheter.  Your catheter has been removed and you are suddenly unable to  urinate.  Your urine smells unusually bad.  You start to have blood clots in your urine.  The blood in your urine becomes persistent or gets thick.  You develop chest pains.  You develop shortness of breath.  You develop swelling or pain in your leg. Summary  You may notice urinary symptoms for a few weeks after your procedure.  Follow instructions from your health care provider regarding activity restrictions such as lifting, exercise, and sexual activity.  Contact your health care provider if you have any unusual symptoms during your recovery. This information is not intended to replace advice given to you by your health care provider. Make sure you discuss any questions you have with your health care provider. Document Released: 01/18/2005 Document Revised: 12/31/2016 Document Reviewed: 09/05/2015 Elsevier Patient Education  2020 [employer].

## 2018-10-28 DIAGNOSIS — N401 Enlarged prostate with lower urinary tract symptoms: Secondary | ICD-10-CM | POA: Diagnosis not present

## 2018-10-28 LAB — GLUCOSE, CAPILLARY: Glucose-Capillary: 160 mg/dL — ABNORMAL HIGH (ref 70–99)

## 2018-10-28 MED ORDER — CHLORHEXIDINE GLUCONATE CLOTH 2 % EX PADS: 6.0000 | MEDICATED_PAD | Freq: Every day | CUTANEOUS | Status: DC

## 2018-10-28 NOTE — Progress Notes (Signed)
Patient ID: Vincent [name], male   DOB: 1945/09/27, 73 y.o.   MRN: [id number]  1 Day Post-Op Subjective: Doing well s/p TURP.  No pain.  Urine clear with CBI about off.  Objective: Vital signs in last 24 hours: Temp:  [97.8 F (36.6 C)-98.2 F (36.8 C)] 97.8 F (36.6 C) (09/26 0600) Pulse Rate:  [58-95] 58 (09/26 0600) Resp:  [13-18] 16 (09/26 0600) BP: (131-172)/(79-100) 132/87 (09/26 0600) SpO2:  [96 %-100 %] 97 % (09/26 0600) Weight:  [79.3 kg-82.5 kg] 82.5 kg (09/25 1510)  Intake/Output from previous day: 09/25 0701 - 09/26 0700 In: 6000 [I.V.:1700; IV Piggyback:100] Out: 9250 [Urine:9100; Blood:150] Intake/Output this shift: No intake/output data recorded.  Physical Exam:  General: Alert and oriented GU: Urine remained clear with CBI off  Lab Results: Recent Labs    10/27/18 0844 10/27/18 1510  HGB 15.0 13.6  HCT 44.0 43.9   BMET Recent Labs    10/27/18 0844  NA 142  K 4.5  CL 106  GLUCOSE 125*  BUN 16  CREATININE 0.90     Studies/Results: No results found.  Assessment/Plan: POD # 1 s/p TURP - D/C home with catheter   LOS: 0 days   Dutch Gray 10/28/2018, 8:14 AM

## 2018-10-28 NOTE — Discharge Summary (Signed)
  Date of admission: 10/27/2018  Date of discharge: 10/28/2018  Admission diagnosis: BPH  Discharge diagnosis: BPH  History and Physical: For full details, please see admission history and physical. Briefly, Vincent [name] is a 73 y.o. year old patient with BPH and LUTS.   Hospital Course: He underwent TURP on 10/27/18.  He was maintained on CBI overnight and his urine was clear on POD # 1.  He was stable for discharge home with his catheter.  Laboratory values:  Recent Labs    10/27/18 0844 10/27/18 1510  HGB 15.0 13.6  HCT 44.0 43.9   Recent Labs    10/27/18 0844  CREATININE 0.90    Disposition: Home  Discharge instruction: The patient was instructed to be ambulatory but told to refrain from heavy lifting, strenuous activity, or driving.  Discharge medications:  Allergies as of 10/28/2018      Reactions   Chlorhexidine       Medication List    TAKE these medications   aspirin 81 MG EC tablet Take 1 tablet (81 mg total) by mouth daily. Swallow whole.   cephALEXin 500 MG capsule Commonly known as: Keflex Take 1 capsule (500 mg total) by mouth at bedtime.   finasteride 5 MG tablet Commonly known as: PROSCAR TK 1 T PO QHS What changed:   how much to take  how to take this  when to take this  additional instructions   ibuprofen 800 MG tablet Commonly known as: ADVIL TAKE 1 TABLET(800 MG) BY MOUTH EVERY 8 HOURS AS NEEDED FOR PAIN What changed: See the new instructions.   lansoprazole 30 MG capsule Commonly known as: PREVACID TAKE 1 CAPSULE(30 MG) BY MOUTH DAILY What changed:   how much to take  how to take this  when to take this  additional instructions   meloxicam 7.5 MG tablet Commonly known as: MOBIC Take 1-2 tablets (7.5-15 mg total) by mouth daily.   rosuvastatin 20 MG tablet Commonly known as: CRESTOR Take 20 mg by mouth every evening.   tamsulosin 0.4 MG Caps capsule Commonly known as: FLOMAX Take 0.4 mg by mouth every  evening.   Vitamin D (Ergocalciferol) 1.25 MG (50000 UT) Caps capsule Commonly known as: DRISDOL Take 1 capsule (50,000 Units total) by mouth every 7 (seven) days.       Followup:  Follow-up Information    Festus Aloe, MD On 11/03/2018.   Specialty: Urology Why: at 9:30 AM with NP Jimmie Molly information: [place] [address] [phone]

## 2018-10-28 NOTE — Anesthesia Postprocedure Evaluation (Signed)
Anesthesia Post Note  Patient: Vincent [name]  Procedure(s) Performed: Marcelino Duster LASER TURP (TRANSURETHRAL RESECTION OF PROSTATE) (N/A Prostate)     Patient location during evaluation: PACU Anesthesia Type: General Level of consciousness: awake and alert Pain management: pain level controlled Vital Signs Assessment: post-procedure vital signs reviewed and stable Respiratory status: spontaneous breathing, nonlabored ventilation, respiratory function stable and patient connected to nasal cannula oxygen Cardiovascular status: blood pressure returned to baseline and stable Postop Assessment: no apparent nausea or vomiting Anesthetic complications: no    Last Vitals:  Vitals:   10/28/18 0600 10/28/18 1019  BP: 132/87 126/80  Pulse: (!) 58 85  Resp: 16 17  Temp: 36.6 C 36.8 C  SpO2: 97% 97%    Last Pain:  Vitals:   10/28/18 1019  TempSrc: Oral  PainSc:                  Arafat Nicoletta Hush

## 2018-10-30 ENCOUNTER — Encounter (HOSPITAL_BASED_OUTPATIENT_CLINIC_OR_DEPARTMENT_OTHER): Payer: Self-pay | Admitting: Urology

## 2018-10-30 ENCOUNTER — Telehealth: Payer: Self-pay | Admitting: Internal Medicine

## 2018-10-30 ENCOUNTER — Telehealth: Payer: Self-pay | Admitting: *Deleted

## 2018-10-30 LAB — SURGICAL PATHOLOGY

## 2018-10-30 NOTE — Telephone Encounter (Signed)
Pt will call office back to schedule appointment. SF

## 2018-10-30 NOTE — Telephone Encounter (Signed)
Pt was on TCM report admitted 10/27/18 for BPH/ pt underwent TURP. Pt tolerated procedure well, and he was discharge 10/28/18 to home with his catheter. Pt will follow-up w/urologist on 11/03/18.Marland KitchenJohny Chess

## 2019-01-10 ENCOUNTER — Encounter: Payer: Self-pay | Admitting: Internal Medicine

## 2019-01-10 ENCOUNTER — Ambulatory Visit (INDEPENDENT_AMBULATORY_CARE_PROVIDER_SITE_OTHER): Payer: Medicare Other | Admitting: Internal Medicine

## 2019-01-10 DIAGNOSIS — Z789 Other specified health status: Secondary | ICD-10-CM

## 2019-01-10 NOTE — Assessment & Plan Note (Signed)
See notes

## 2019-01-10 NOTE — Patient Instructions (Addendum)
Please continue all other medications as before, and refills have been done if requested.  Please have the pharmacy call with any other refills you may need.  Please continue your efforts at being more active, low cholesterol diet, and weight control.  Please keep your appointments with your specialists as you may have planned  Please go to the XRAY Department in the Basement (go straight as you get off the elevator) for the x-ray testing if your COVID is neg  Please go to the LAB in the Basement (turn left off the elevator) for the tests to be done today  You will be contacted by phone if any changes need to be made immediately.  Otherwise, you will receive a letter about your results with an explanation, but please check with MyChart first.  Please remember to sign up for MyChart if you have not done so, as this will be important to you in the future with finding out test results, communicating by private email, and scheduling acute appointments online when needed.  Please go to UC or ED if worse and you are unable to find the COVID result and you are getting worse

## 2019-01-10 NOTE — Progress Notes (Signed)
Patient ID: Vincent [name], male   DOB: 1945-11-19, 73 y.o.   MRN: [id number]  Phone visit  Cumulative time during 7-day interval 21 min,, there was not an associated office visit for this concern within a 7 day period.  Verbal consent for services obtained from patient prior to services given.  Names of all persons present for services: [name], MD, patient  Chief complaint: ill feeling  History, background, results pertinent:  Here with c/o feeling cold at times, shaky weak hard to eat and feed himself, night sweats and myalgias, and 1 or 2 small volume non bloody diarrhea with some very mild crampy abd pains, without high fever, HA, ST, cough, congestion, CP, sob, or constipation or dysuria.  Pt may have had COVID exposure at work and was actually tested about 1 wk ago, but for some reason has not heard the result, and has not called himself to find out.  He has been home since then, no one else ill about him, no sick contacts.  Saw urology last wk and states a UA was negative.    Past Medical History:  Diagnosis Date  . Allergic rhinitis, cause unspecified 02/08/2011  . Anemia, unspecified 04/11/2011  . Arthritis    knee  . 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 BPH (benign prostatic hypertrophy) 02/08/2011  . Cataract    as a child, blind since 74 or 13 yrs old  . 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 Diabetes mellitus type 2, diet-controlled ([place])   . Diverticulosis   . Elevated PSA 02/08/2011  . GERD (gastroesophageal reflux disease) 02/08/2011  . 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/2011   pt unaware  . History of colon polyps   . History of kidney stones   . History of prostatitis 04/11/2011  . Hyperlipidemia   . Lumbar disc disease 02/06/2011  . Vitamin D deficiency   . Wears partial dentures    upper and lower   No results found for this or any previous visit (from the past 48 hour(s)). A/P/next steps:   1)  Acute medical illness -  by itself seems infectious, but unable to tell if viral or bacterial or other based on the limitations of hx and phone call only'; advised pt to f/u his COVID testing, let me know if he would like repeat testing, and present for cxr and labs and expectant management of more telling symptoms. Pt should go to UC or ED for any worsening symptoms  [name] MD

## 2019-01-11 ENCOUNTER — Other Ambulatory Visit: Payer: Self-pay

## 2019-01-11 ENCOUNTER — Other Ambulatory Visit: Payer: Self-pay | Admitting: Internal Medicine

## 2019-01-11 ENCOUNTER — Other Ambulatory Visit (INDEPENDENT_AMBULATORY_CARE_PROVIDER_SITE_OTHER): Payer: Medicare Other

## 2019-01-11 ENCOUNTER — Ambulatory Visit (INDEPENDENT_AMBULATORY_CARE_PROVIDER_SITE_OTHER)
Admission: RE | Admit: 2019-01-11 | Discharge: 2019-01-11 | Disposition: A | Discharge status: Home / Self Care | Payer: Medicare Other | Source: Ambulatory Visit | Attending: Internal Medicine | Admitting: Internal Medicine

## 2019-01-11 DIAGNOSIS — Z789 Other specified health status: Secondary | ICD-10-CM | POA: Diagnosis not present

## 2019-01-11 LAB — URINALYSIS, ROUTINE W REFLEX MICROSCOPIC
Leukocytes,Ua: NEGATIVE
Nitrite: NEGATIVE
RBC / HPF: NONE SEEN (ref 0–?)
Specific Gravity, Urine: >=1.03 — AB (ref 1.000–1.030)
Total Protein, Urine: 100 — AB
Urine Glucose: NEGATIVE
Urobilinogen, UA: 1 (ref 0.0–1.0)
pH: 6 (ref 5.0–8.0)

## 2019-01-11 LAB — HEPATIC FUNCTION PANEL
ALT: 37 U/L (ref 0–53)
AST: 53 U/L — ABNORMAL HIGH (ref 0–37)
Albumin: 3.9 g/dL (ref 3.5–5.2)
Alkaline Phosphatase: 60 U/L (ref 39–117)
Bilirubin, Direct: 0.1 mg/dL (ref 0.0–0.3)
Total Bilirubin: 0.6 mg/dL (ref 0.2–1.2)
Total Protein: 8 g/dL (ref 6.0–8.3)

## 2019-01-11 LAB — BASIC METABOLIC PANEL
BUN: 20 mg/dL (ref 6–23)
CO2: 23 mEq/L (ref 19–32)
Calcium: 9.4 mg/dL (ref 8.4–10.5)
Chloride: 97 mEq/L (ref 96–112)
Creatinine, Ser: 1.36 mg/dL (ref 0.40–1.50)
GFR: 62.16 mL/min (ref >60.00)
Glucose, Bld: 151 mg/dL — ABNORMAL HIGH (ref 70–99)
Potassium: 4.3 mEq/L (ref 3.5–5.1)
Sodium: 132 mEq/L — ABNORMAL LOW (ref 135–145)

## 2019-01-11 LAB — TSH: TSH: 1.31 u[IU]/mL (ref 0.35–4.50)

## 2019-01-11 LAB — CBC WITH DIFFERENTIAL/PLATELET
Basophils Absolute: 0.1 10*3/uL (ref 0.0–0.1)
Basophils Relative: 1 % (ref 0.0–3.0)
Eosinophils Absolute: 0 10*3/uL (ref 0.0–0.7)
Eosinophils Relative: 0 % (ref 0.0–5.0)
HCT: 42.2 % (ref 39.0–52.0)
Hemoglobin: 13.6 g/dL (ref 13.0–17.0)
Lymphocytes Relative: 18.2 % (ref 12.0–46.0)
Lymphs Abs: 1 10*3/uL (ref 0.7–4.0)
MCHC: 32.3 g/dL (ref 30.0–36.0)
MCV: 85.2 fl (ref 78.0–100.0)
Monocytes Absolute: 0.6 10*3/uL (ref 0.1–1.0)
Monocytes Relative: 11.3 % (ref 3.0–12.0)
Neutro Abs: 3.7 10*3/uL (ref 1.4–7.7)
Neutrophils Relative %: 69.5 % (ref 43.0–77.0)
Platelets: 236 10*3/uL (ref 150.0–400.0)
RBC: 4.95 Mil/uL (ref 4.22–5.81)
RDW: 13.5 % (ref 11.5–15.5)
WBC: 5.3 10*3/uL (ref 4.0–10.5)

## 2019-01-11 LAB — LIPASE: Lipase: 23 U/L (ref 11.0–59.0)

## 2019-01-11 MED ORDER — LEVOFLOXACIN 500 MG PO TABS: 500.0000 mg | ORAL_TABLET | Freq: Every day | ORAL | 0 refills | Status: DC

## 2019-01-12 ENCOUNTER — Inpatient Hospital Stay (HOSPITAL_COMMUNITY)
Admission: EM | Admit: 2019-01-12 | Discharge: 2019-01-17 | DRG: 177 | Disposition: A | Discharge status: Home / Self Care | Payer: Medicare Other | Attending: Internal Medicine | Admitting: Internal Medicine

## 2019-01-12 DIAGNOSIS — J129 Viral pneumonia, unspecified: Secondary | ICD-10-CM | POA: Diagnosis present

## 2019-01-12 DIAGNOSIS — U071 COVID-19: Secondary | ICD-10-CM | POA: Diagnosis not present

## 2019-01-12 DIAGNOSIS — A0839 Other viral enteritis: Secondary | ICD-10-CM | POA: Diagnosis present

## 2019-01-12 DIAGNOSIS — Z791 Long term (current) use of non-steroidal anti-inflammatories (NSAID): Secondary | ICD-10-CM

## 2019-01-12 DIAGNOSIS — Z7982 Long term (current) use of aspirin: Secondary | ICD-10-CM

## 2019-01-12 DIAGNOSIS — E119 Type 2 diabetes mellitus without complications: Secondary | ICD-10-CM | POA: Diagnosis present

## 2019-01-12 DIAGNOSIS — Z833 Family history of diabetes mellitus: Secondary | ICD-10-CM

## 2019-01-12 DIAGNOSIS — I10 Essential (primary) hypertension: Secondary | ICD-10-CM | POA: Diagnosis present

## 2019-01-12 DIAGNOSIS — E559 Vitamin D deficiency, unspecified: Secondary | ICD-10-CM | POA: Diagnosis present

## 2019-01-12 DIAGNOSIS — J1282 Pneumonia due to coronavirus disease 2019: Secondary | ICD-10-CM

## 2019-01-12 DIAGNOSIS — K5790 Diverticulosis of intestine, part unspecified, without perforation or abscess without bleeding: Secondary | ICD-10-CM | POA: Diagnosis present

## 2019-01-12 DIAGNOSIS — Z888 Allergy status to other drugs, medicaments and biological substances status: Secondary | ICD-10-CM

## 2019-01-12 DIAGNOSIS — N400 Enlarged prostate without lower urinary tract symptoms: Secondary | ICD-10-CM | POA: Diagnosis present

## 2019-01-12 DIAGNOSIS — Z821 Family history of blindness and visual loss: Secondary | ICD-10-CM

## 2019-01-12 DIAGNOSIS — E785 Hyperlipidemia, unspecified: Secondary | ICD-10-CM | POA: Diagnosis present

## 2019-01-12 DIAGNOSIS — Z87891 Personal history of nicotine dependence: Secondary | ICD-10-CM

## 2019-01-12 DIAGNOSIS — M1710 Unilateral primary osteoarthritis, unspecified knee: Secondary | ICD-10-CM | POA: Diagnosis present

## 2019-01-12 DIAGNOSIS — J189 Pneumonia, unspecified organism: Secondary | ICD-10-CM | POA: Diagnosis not present

## 2019-01-12 DIAGNOSIS — M5030 Other cervical disc degeneration, unspecified cervical region: Secondary | ICD-10-CM | POA: Diagnosis present

## 2019-01-12 DIAGNOSIS — Z8249 Family history of ischemic heart disease and other diseases of the circulatory system: Secondary | ICD-10-CM

## 2019-01-12 DIAGNOSIS — Z79899 Other long term (current) drug therapy: Secondary | ICD-10-CM

## 2019-01-12 DIAGNOSIS — J1289 Other viral pneumonia: Secondary | ICD-10-CM | POA: Diagnosis present

## 2019-01-12 DIAGNOSIS — H548 Legal blindness, as defined in USA: Secondary | ICD-10-CM | POA: Diagnosis present

## 2019-01-12 DIAGNOSIS — K219 Gastro-esophageal reflux disease without esophagitis: Secondary | ICD-10-CM | POA: Diagnosis present

## 2019-01-12 DIAGNOSIS — Z87442 Personal history of urinary calculi: Secondary | ICD-10-CM

## 2019-01-12 DIAGNOSIS — E872 Acidosis: Secondary | ICD-10-CM | POA: Diagnosis present

## 2019-01-13 ENCOUNTER — Encounter (HOSPITAL_COMMUNITY): Payer: Self-pay | Admitting: Internal Medicine

## 2019-01-13 ENCOUNTER — Other Ambulatory Visit: Payer: Self-pay

## 2019-01-13 ENCOUNTER — Emergency Department (HOSPITAL_COMMUNITY): Payer: Medicare Other

## 2019-01-13 DIAGNOSIS — Z888 Allergy status to other drugs, medicaments and biological substances status: Secondary | ICD-10-CM | POA: Diagnosis not present

## 2019-01-13 DIAGNOSIS — Z8249 Family history of ischemic heart disease and other diseases of the circulatory system: Secondary | ICD-10-CM | POA: Diagnosis not present

## 2019-01-13 DIAGNOSIS — Z87891 Personal history of nicotine dependence: Secondary | ICD-10-CM | POA: Diagnosis not present

## 2019-01-13 DIAGNOSIS — Z833 Family history of diabetes mellitus: Secondary | ICD-10-CM | POA: Diagnosis not present

## 2019-01-13 DIAGNOSIS — K219 Gastro-esophageal reflux disease without esophagitis: Secondary | ICD-10-CM | POA: Diagnosis present

## 2019-01-13 DIAGNOSIS — M5030 Other cervical disc degeneration, unspecified cervical region: Secondary | ICD-10-CM | POA: Diagnosis present

## 2019-01-13 DIAGNOSIS — Z821 Family history of blindness and visual loss: Secondary | ICD-10-CM | POA: Diagnosis not present

## 2019-01-13 DIAGNOSIS — E785 Hyperlipidemia, unspecified: Secondary | ICD-10-CM | POA: Diagnosis present

## 2019-01-13 DIAGNOSIS — U071 COVID-19: Secondary | ICD-10-CM | POA: Diagnosis present

## 2019-01-13 DIAGNOSIS — J189 Pneumonia, unspecified organism: Secondary | ICD-10-CM | POA: Diagnosis present

## 2019-01-13 DIAGNOSIS — J129 Viral pneumonia, unspecified: Secondary | ICD-10-CM | POA: Diagnosis present

## 2019-01-13 DIAGNOSIS — J1289 Other viral pneumonia: Secondary | ICD-10-CM | POA: Diagnosis present

## 2019-01-13 DIAGNOSIS — N400 Enlarged prostate without lower urinary tract symptoms: Secondary | ICD-10-CM | POA: Diagnosis present

## 2019-01-13 DIAGNOSIS — M1710 Unilateral primary osteoarthritis, unspecified knee: Secondary | ICD-10-CM | POA: Diagnosis present

## 2019-01-13 DIAGNOSIS — E119 Type 2 diabetes mellitus without complications: Secondary | ICD-10-CM | POA: Diagnosis present

## 2019-01-13 DIAGNOSIS — Z87442 Personal history of urinary calculi: Secondary | ICD-10-CM | POA: Diagnosis not present

## 2019-01-13 DIAGNOSIS — A0839 Other viral enteritis: Secondary | ICD-10-CM | POA: Diagnosis present

## 2019-01-13 DIAGNOSIS — K5790 Diverticulosis of intestine, part unspecified, without perforation or abscess without bleeding: Secondary | ICD-10-CM | POA: Diagnosis present

## 2019-01-13 DIAGNOSIS — H548 Legal blindness, as defined in USA: Secondary | ICD-10-CM | POA: Diagnosis present

## 2019-01-13 DIAGNOSIS — E559 Vitamin D deficiency, unspecified: Secondary | ICD-10-CM | POA: Diagnosis present

## 2019-01-13 DIAGNOSIS — E872 Acidosis: Secondary | ICD-10-CM | POA: Diagnosis present

## 2019-01-13 DIAGNOSIS — Z7982 Long term (current) use of aspirin: Secondary | ICD-10-CM | POA: Diagnosis not present

## 2019-01-13 DIAGNOSIS — I10 Essential (primary) hypertension: Secondary | ICD-10-CM | POA: Diagnosis present

## 2019-01-13 DIAGNOSIS — Z79899 Other long term (current) drug therapy: Secondary | ICD-10-CM | POA: Diagnosis not present

## 2019-01-13 DIAGNOSIS — Z791 Long term (current) use of non-steroidal anti-inflammatories (NSAID): Secondary | ICD-10-CM | POA: Diagnosis not present

## 2019-01-13 LAB — SARS CORONAVIRUS 2 (TAT 6-24 HRS): SARS Coronavirus 2: POSITIVE — AB

## 2019-01-13 LAB — COMPREHENSIVE METABOLIC PANEL
ALT: 61 U/L — ABNORMAL HIGH (ref 0–44)
AST: 84 U/L — ABNORMAL HIGH (ref 15–41)
Albumin: 3.3 g/dL — ABNORMAL LOW (ref 3.5–5.0)
Alkaline Phosphatase: 55 U/L (ref 38–126)
Anion gap: 14 (ref 5–15)
BUN: 21 mg/dL (ref 8–23)
CO2: 23 mmol/L (ref 22–32)
Calcium: 9.1 mg/dL (ref 8.9–10.3)
Chloride: 98 mmol/L (ref 98–111)
Creatinine, Ser: 1.19 mg/dL (ref 0.61–1.24)
GFR calc Af Amer: >60 mL/min (ref >60)
GFR calc non Af Amer: >60 mL/min (ref >60)
Glucose, Bld: 132 mg/dL — ABNORMAL HIGH (ref 70–99)
Potassium: 4.3 mmol/L (ref 3.5–5.1)
Sodium: 135 mmol/L (ref 135–145)
Total Bilirubin: 0.8 mg/dL (ref 0.3–1.2)
Total Protein: 7.7 g/dL (ref 6.5–8.1)

## 2019-01-13 LAB — RESPIRATORY PANEL BY PCR

## 2019-01-13 LAB — URINALYSIS, ROUTINE W REFLEX MICROSCOPIC
Bacteria, UA: NONE SEEN
Bilirubin Urine: NEGATIVE
Glucose, UA: NEGATIVE mg/dL
Hgb urine dipstick: NEGATIVE
Ketones, ur: NEGATIVE mg/dL
Leukocytes,Ua: NEGATIVE
Nitrite: NEGATIVE
Protein, ur: 30 mg/dL — AB
Specific Gravity, Urine: >1.046 — ABNORMAL HIGH (ref 1.005–1.030)
pH: 5 (ref 5.0–8.0)

## 2019-01-13 LAB — URINE CULTURE
MICRO NUMBER:: 1184842
SPECIMEN QUALITY:: ADEQUATE

## 2019-01-13 LAB — LACTATE DEHYDROGENASE: LDH: 313 U/L — ABNORMAL HIGH (ref 98–192)

## 2019-01-13 LAB — CBC WITH DIFFERENTIAL/PLATELET
Abs Immature Granulocytes: 0.06 10*3/uL (ref 0.00–0.07)
Basophils Absolute: 0 10*3/uL (ref 0.0–0.1)
Basophils Relative: 0 %
Eosinophils Absolute: 0 10*3/uL (ref 0.0–0.5)
Eosinophils Relative: 0 %
HCT: 41.9 % (ref 39.0–52.0)
Hemoglobin: 13.1 g/dL (ref 13.0–17.0)
Immature Granulocytes: 1 %
Lymphocytes Relative: 12 %
Lymphs Abs: 0.8 10*3/uL (ref 0.7–4.0)
MCH: 27.2 pg (ref 26.0–34.0)
MCHC: 31.3 g/dL (ref 30.0–36.0)
MCV: 86.9 fL (ref 80.0–100.0)
Monocytes Absolute: 0.5 10*3/uL (ref 0.1–1.0)
Monocytes Relative: 7 %
Neutro Abs: 5.5 10*3/uL (ref 1.7–7.7)
Neutrophils Relative %: 80 %
Platelets: 268 10*3/uL (ref 150–400)
RBC: 4.82 MIL/uL (ref 4.22–5.81)
RDW: 13.2 % (ref 11.5–15.5)
WBC: 6.9 10*3/uL (ref 4.0–10.5)
nRBC: 0 % (ref 0.0–0.2)

## 2019-01-13 LAB — TRIGLYCERIDES: Triglycerides: 102 mg/dL (ref <150)

## 2019-01-13 LAB — LACTIC ACID, PLASMA: Lactic Acid, Venous: 2.3 mmol/L (ref 0.5–1.9)

## 2019-01-13 LAB — PROCALCITONIN: Procalcitonin: <0.1 ng/mL

## 2019-01-13 LAB — STREP PNEUMONIAE URINARY ANTIGEN: Strep Pneumo Urinary Antigen: NEGATIVE

## 2019-01-13 LAB — C-REACTIVE PROTEIN: CRP: 5 mg/dL — ABNORMAL HIGH (ref <1.0)

## 2019-01-13 LAB — POC SARS CORONAVIRUS 2 AG -  ED: SARS Coronavirus 2 Ag: NEGATIVE

## 2019-01-13 LAB — D-DIMER, QUANTITATIVE: D-Dimer, Quant: 3.38 ug/mL-FEU — ABNORMAL HIGH (ref 0.00–0.50)

## 2019-01-13 LAB — ABO/RH: ABO/RH(D): A POS

## 2019-01-13 LAB — FERRITIN: Ferritin: 1061 ng/mL — ABNORMAL HIGH (ref 24–336)

## 2019-01-13 LAB — FIBRINOGEN: Fibrinogen: 613 mg/dL — ABNORMAL HIGH (ref 210–475)

## 2019-01-13 MED ORDER — VITAMIN C 500 MG PO TABS
500.0000 mg | ORAL_TABLET | Freq: Every day | ORAL | Status: DC
  Administered 2019-01-13 – 2019-01-17 (×4): 500 mg via ORAL
  Filled 2019-01-13 (×5): qty 1

## 2019-01-13 MED ORDER — ASPIRIN EC 81 MG PO TBEC
81.0000 mg | DELAYED_RELEASE_TABLET | Freq: Every day | ORAL | Status: DC
  Administered 2019-01-13 – 2019-01-17 (×5): 81 mg via ORAL
  Filled 2019-01-13 (×5): qty 1

## 2019-01-13 MED ORDER — SODIUM CHLORIDE (PF) 0.9 % IJ SOLN
INTRAMUSCULAR | Status: AC
  Administered 2019-01-13: 07:00:00
  Filled 2019-01-13: qty 50

## 2019-01-13 MED ORDER — SODIUM CHLORIDE 0.9 % IV SOLN
100.0000 mg | Freq: Every day | INTRAVENOUS | Status: AC
  Administered 2019-01-14 – 2019-01-17 (×4): 100 mg via INTRAVENOUS
  Filled 2019-01-13 (×4): qty 100

## 2019-01-13 MED ORDER — ACETAMINOPHEN 325 MG PO TABS: 650.0000 mg | ORAL_TABLET | Freq: Four times a day (QID) | ORAL | Status: DC | PRN

## 2019-01-13 MED ORDER — ZINC SULFATE 220 (50 ZN) MG PO CAPS
220.0000 mg | ORAL_CAPSULE | Freq: Every day | ORAL | Status: DC
  Administered 2019-01-13 – 2019-01-17 (×5): 220 mg via ORAL
  Filled 2019-01-13 (×6): qty 1

## 2019-01-13 MED ORDER — DEXAMETHASONE 4 MG PO TABS
6.0000 mg | ORAL_TABLET | ORAL | Status: DC
  Administered 2019-01-13 – 2019-01-17 (×5): 6 mg via ORAL
  Filled 2019-01-13: qty 2
  Filled 2019-01-13: qty 1
  Filled 2019-01-13 (×3): qty 2

## 2019-01-13 MED ORDER — SODIUM CHLORIDE 0.9 % IV SOLN
200.0000 mg | Freq: Once | INTRAVENOUS | Status: AC
  Administered 2019-01-13: 200 mg via INTRAVENOUS
  Filled 2019-01-13: qty 200

## 2019-01-13 MED ORDER — ROSUVASTATIN CALCIUM 20 MG PO TABS
20.0000 mg | ORAL_TABLET | Freq: Every evening | ORAL | Status: DC
  Administered 2019-01-13 – 2019-01-16 (×4): 20 mg via ORAL
  Filled 2019-01-13 (×5): qty 1

## 2019-01-13 MED ORDER — ONDANSETRON HCL 4 MG/2ML IJ SOLN
INTRAMUSCULAR | Status: AC
  Filled 2019-01-13: qty 2

## 2019-01-13 MED ORDER — ONDANSETRON HCL 4 MG/2ML IJ SOLN
4.0000 mg | Freq: Four times a day (QID) | INTRAMUSCULAR | Status: DC | PRN
  Administered 2019-01-13: 12:00:00 4 mg via INTRAVENOUS

## 2019-01-13 MED ORDER — TAMSULOSIN HCL 0.4 MG PO CAPS
0.4000 mg | ORAL_CAPSULE | Freq: Every evening | ORAL | Status: DC
  Administered 2019-01-13 – 2019-01-16 (×4): 0.4 mg via ORAL
  Filled 2019-01-13 (×4): qty 1

## 2019-01-13 MED ORDER — PANTOPRAZOLE SODIUM 40 MG PO TBEC
40.0000 mg | DELAYED_RELEASE_TABLET | Freq: Every day | ORAL | Status: DC
  Administered 2019-01-13 – 2019-01-17 (×5): 40 mg via ORAL
  Filled 2019-01-13 (×5): qty 1

## 2019-01-13 MED ORDER — IPRATROPIUM-ALBUTEROL 20-100 MCG/ACT IN AERS
1.0000 | INHALATION_SPRAY | Freq: Four times a day (QID) | RESPIRATORY_TRACT | Status: DC
  Administered 2019-01-13 – 2019-01-17 (×17): 1 via RESPIRATORY_TRACT
  Filled 2019-01-13: qty 4

## 2019-01-13 MED ORDER — ENOXAPARIN SODIUM 40 MG/0.4ML ~~LOC~~ SOLN
40.0000 mg | SUBCUTANEOUS | Status: DC
  Administered 2019-01-13 – 2019-01-17 (×5): 40 mg via SUBCUTANEOUS
  Filled 2019-01-13 (×5): qty 0.4

## 2019-01-13 MED ORDER — IOHEXOL 350 MG/ML SOLN
80.0000 mL | Freq: Once | INTRAVENOUS | Status: AC | PRN
  Administered 2019-01-13: 06:00:00 80 mL via INTRAVENOUS

## 2019-01-13 MED ORDER — FINASTERIDE 5 MG PO TABS
5.0000 mg | ORAL_TABLET | Freq: Every day | ORAL | Status: DC
  Administered 2019-01-13 – 2019-01-17 (×5): 5 mg via ORAL
  Filled 2019-01-13 (×5): qty 1

## 2019-01-13 MED ORDER — ACETAMINOPHEN 325 MG PO TABS
650.0000 mg | ORAL_TABLET | Freq: Once | ORAL | Status: AC
  Administered 2019-01-13: 01:00:00 650 mg via ORAL
  Filled 2019-01-13: qty 2

## 2019-01-13 MED ORDER — SODIUM CHLORIDE 0.9 % IV BOLUS
500.0000 mL | Freq: Once | INTRAVENOUS | Status: AC
  Administered 2019-01-13: 500 mL via INTRAVENOUS

## 2019-01-13 MED ORDER — SENNOSIDES-DOCUSATE SODIUM 8.6-50 MG PO TABS: 1.0000 | ORAL_TABLET | Freq: Every evening | ORAL | Status: DC | PRN

## 2019-01-13 NOTE — ED Notes (Signed)
He is able to stand and void, after which he passes another brown diarrhea stool (very thick, pudding-like consistency). He then stands to get back to bed. He tells me he feels "better". He has no other requests at this time.

## 2019-01-13 NOTE — ED Provider Notes (Signed)
[place] DEPT Provider Note   CSN: [id number] Arrival date & time: 01/12/19  2353     History Chief Complaint  Patient presents with  . Fever  . Pneumonia    Vincent [name] is a 73 y.o. male.  Patient brought to the emergency department from home by ambulance.  Patient has had chills and shakes tonight.  He was seen by his primary care doctor yesterday and diagnosed with pneumonia.  He was started on Levaquin.  Patient reports worsening symptoms tonight.  He is not experiencing any significant shortness of breath.        Past Medical History:  Diagnosis Date  . Allergic rhinitis, cause unspecified 02/08/2011  . Anemia, unspecified 04/11/2011  . Arthritis    knee  . 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 BPH (benign prostatic hypertrophy) 02/08/2011  . Cataract    as a child, blind since 72 or 68 yrs old  . 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 Diabetes mellitus type 2, diet-controlled ([place])   . Diverticulosis   . Elevated PSA 02/08/2011  . GERD (gastroesophageal reflux disease) 02/08/2011  . 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/2011   pt unaware  . History of colon polyps   . History of kidney stones   . History of prostatitis 04/11/2011  . Hyperlipidemia   . Lumbar disc disease 02/06/2011  . Vitamin D deficiency   . Wears partial dentures    upper and lower    Patient Active Problem List   Diagnosis Date Noted  . Acute medical illness 01/10/2019  . BPH with obstruction/lower urinary tract symptoms 10/27/2018  . Chest pain 08/02/2018  . Right shoulder pain 07/16/2018  . Vitamin D deficiency 07/16/2018  . Abdominal pain 10/28/2017  . Hyperlipidemia 09/21/2016  . Diabetes ([place]) 09/10/2014  . Neck pain on left side 09/08/2013  . History of prostatitis 04/11/2011  . 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 Anemia, unspecified 04/11/2011   . Allergic rhinitis, cause unspecified 02/08/2011  . GERD (gastroesophageal reflux disease) 02/08/2011  . Elevated PSA 02/08/2011  . BPH (benign prostatic hypertrophy) 02/08/2011  . Chronic low back pain 02/08/2011  . Lumbar disc disease 02/06/2011  . 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 Encounter for well adult exam with abnormal findings 12/19/2010    Past Surgical History:  Procedure Laterality Date  . BACK SURGERY     x 3  L4-L5  . COLONOSCOPY  2007   Dr Wynetta Emery  . CYST EXCISION    . THULIUM LASER TURP (TRANSURETHRAL RESECTION OF PROSTATE) N/A 10/27/2018   Procedure: THULIUM LASER TURP (TRANSURETHRAL RESECTION OF PROSTATE);  Surgeon: Festus Aloe, MD;  Location: [facility];  Service: Urology;  Laterality: N/A;  . WISDOM TOOTH EXTRACTION         Family History  Problem Relation Age of Onset  . Blindness Other   . Heart disease Other   . Hypertension Other   . Diabetes Other   . Mental illness Other   . Sudden death Other   . Colon cancer Neg Hx   . Esophageal cancer Neg Hx   . Stomach cancer Neg Hx   . Rectal cancer Neg Hx     Social History   Tobacco Use  . Smoking status: Former Smoker    Types: Cigarettes    Quit date: 05/21/1963    Years since quitting: 55.6  . Smokeless tobacco: Never  Used  Substance Use Topics  . Alcohol use: No    Alcohol/week: 0.0 standard drinks  . Drug use: No    Home Medications Prior to Admission medications   Medication Sig Start Date End Date Taking? Authorizing Provider  aspirin 81 MG EC tablet Take 1 tablet (81 mg total) by mouth daily. Swallow whole. 08/01/12  Yes Biagio Borg, MD  finasteride (PROSCAR) 5 MG tablet TK 1 T PO QHS Patient taking differently: Take 5 mg by mouth daily.  04/29/17  Yes Biagio Borg, MD  lansoprazole (PREVACID) 30 MG capsule TAKE 1 CAPSULE(30 MG) BY MOUTH DAILY Patient taking differently: Take 30 mg by mouth at bedtime.  09/21/18  Yes Biagio Borg, MD  levofloxacin (LEVAQUIN) 500 MG  tablet Take 1 tablet (500 mg total) by mouth daily for 10 days. 01/11/19 01/21/19 Yes Biagio Borg, MD  rosuvastatin (CRESTOR) 20 MG tablet Take 20 mg by mouth every evening.   Yes [provider]  sulfamethoxazole-trimethoprim (BACTRIM DS) 800-160 MG tablet Take 1 tablet by mouth 2 (two) times daily. 01/02/19  Yes [provider]  Vitamin D, Ergocalciferol, (DRISDOL) 1.25 MG (50000 UT) CAPS capsule Take 1 capsule (50,000 Units total) by mouth every 7 (seven) days. 07/11/18  Yes Biagio Borg, MD  cephALEXin (KEFLEX) 500 MG capsule Take 1 capsule (500 mg total) by mouth at bedtime. 10/27/18   Festus Aloe, MD  ibuprofen (ADVIL,MOTRIN) 800 MG tablet TAKE 1 TABLET(800 MG) BY MOUTH EVERY 8 HOURS AS NEEDED FOR PAIN Patient taking differently: Take 800 mg by mouth every 8 (eight) hours as needed for moderate pain.  05/05/18   Biagio Borg, MD  meloxicam (MOBIC) 7.5 MG tablet Take 1-2 tablets (7.5-15 mg total) by mouth daily. Patient not taking: Reported on 10/27/2018 09/12/17   Mancel Bale, PA-C  tamsulosin (FLOMAX) 0.4 MG CAPS capsule Take 0.4 mg by mouth every evening.  07/12/14   [provider]    Allergies    Chlorhexidine  Review of Systems   Review of Systems  Constitutional: Positive for chills and fever.  Musculoskeletal: Positive for myalgias.  All other systems reviewed and are negative.   Physical Exam Updated Vital Signs BP 106/82   Pulse (!) 125   Temp (!) 100.4 F (38 C) (Oral)   Resp (!) 26   SpO2 94%   Physical Exam Vitals and nursing note reviewed.  Constitutional:      Appearance: Normal appearance. He is well-developed.     Comments: Exhibiting rigors  HENT:     Head: Normocephalic and atraumatic.     Right Ear: Hearing normal.     Left Ear: Hearing normal.     Nose: Nose normal.  Eyes:     Conjunctiva/sclera: Conjunctivae normal.     Pupils: Pupils are equal, round, and reactive to light.  Cardiovascular:     Rate and Rhythm:  Regular rhythm. Tachycardia present.     Heart sounds: S1 normal and S2 normal. No murmur. No friction rub. No gallop.   Pulmonary:     Effort: Pulmonary effort is normal. Tachypnea present. No respiratory distress.     Breath sounds: Normal breath sounds.  Chest:     Chest wall: No tenderness.  Abdominal:     General: Bowel sounds are normal.     Palpations: Abdomen is soft.     Tenderness: There is no abdominal tenderness. There is no guarding or rebound. Negative signs include Murphy's sign and McBurney's  sign.     Hernia: No hernia is present.  Musculoskeletal:        General: Normal range of motion.     Cervical back: Normal range of motion and neck supple.  Skin:    General: Skin is warm and dry.     Findings: No rash.  Neurological:     Mental Status: He is alert and oriented to person, place, and time.     GCS: GCS eye subscore is 4. GCS verbal subscore is 5. GCS motor subscore is 6.     Cranial Nerves: No cranial nerve deficit.     Sensory: No sensory deficit.     Coordination: Coordination normal.  Psychiatric:        Speech: Speech normal.        Behavior: Behavior normal.        Thought Content: Thought content normal.     ED Results / Procedures / Treatments   Labs (all labs ordered are listed, but only abnormal results are displayed) Labs Reviewed  LACTIC ACID, PLASMA - Abnormal; Notable for the following components:      Result Value   Lactic Acid, Venous 2.3 (*)    All other components within normal limits  COMPREHENSIVE METABOLIC PANEL - Abnormal; Notable for the following components:   Glucose, Bld 132 (*)    Albumin 3.3 (*)    AST 84 (*)    ALT 61 (*)    All other components within normal limits  D-DIMER, QUANTITATIVE (NOT AT [facility]) - Abnormal; Notable for the following components:   D-Dimer, Quant 3.38 (*)    All other components within normal limits  LACTATE DEHYDROGENASE - Abnormal; Notable for the following components:   LDH 313 (*)    All other  components within normal limits  FERRITIN - Abnormal; Notable for the following components:   Ferritin 1,061 (*)    All other components within normal limits  FIBRINOGEN - Abnormal; Notable for the following components:   Fibrinogen 613 (*)    All other components within normal limits  C-REACTIVE PROTEIN - Abnormal; Notable for the following components:   CRP 5.0 (*)    All other components within normal limits  CULTURE, BLOOD (ROUTINE X 2)  CULTURE, BLOOD (ROUTINE X 2)  URINE CULTURE  SARS CORONAVIRUS 2 (TAT 6-24 HRS)  CBC WITH DIFFERENTIAL/PLATELET  PROCALCITONIN  TRIGLYCERIDES  LACTIC ACID, PLASMA  URINALYSIS, ROUTINE W REFLEX MICROSCOPIC  POC SARS CORONAVIRUS 2 AG -  ED    EKG EKG Interpretation  Date/Time:  Saturday January 13 2019 00:17:01 EST Ventricular Rate:  124 PR Interval:    QRS Duration: 69 QT Interval:  279 QTC Calculation: 401 R Axis:   -12 Text Interpretation: Sinus tachycardia Probable left atrial enlargement Abnormal R-wave progression, early transition No significant change since last tracing Confirmed by Orpah Greek [id number]) on 01/13/2019 2:07:38 AM   Radiology DG Chest 2 View  Result Date: 01/11/2019 CLINICAL DATA:  Myalgia. EXAM: CHEST - 2 VIEW COMPARISON:  Prior chest x-ray report 11/22/2000. FINDINGS: Mediastinum hilar structures normal. Bilateral interstitial prominence. No pleural effusion or pneumothorax. Elevation left hemidiaphragm. Scratched it mild elevation left hemidiaphragm. IMPRESSION: Bilateral pulmonary interstitial prominence consistent with an active process such as pneumonitis. Electronically Signed   By: Marcello Moores  Register   On: 01/11/2019 13:44   DG Chest Port 1 View  Result Date: 01/13/2019 CLINICAL DATA:  Fever and chills EXAM: PORTABLE CHEST 1 VIEW COMPARISON:  01/11/2019 FINDINGS: Cardiac shadows within  normal limits. The lungs are well aerated bilaterally. Mild interstitial changes are again identified in the bases  predominately similar to that seen on the prior exam. No new focal infiltrate is seen. No sizable effusion is noted. IMPRESSION: Stable bibasilar interstitial markings which may represent some pneumonitis. This is stable from the recent exam. Electronically Signed   By: Inez Catalina M.D.   On: 01/13/2019 00:44    Procedures Procedures (including critical care time)  Medications Ordered in ED Medications  acetaminophen (TYLENOL) tablet 650 mg (650 mg Oral Given 01/13/19 0040)  sodium chloride (PF) 0.9 % injection (  Given by Other 01/13/19 0631)  iohexol (OMNIPAQUE) 350 MG/ML injection 80 mL (80 mLs Intravenous Contrast Given 01/13/19 [id number])    ED Course  I have reviewed the triage vital signs and the nursing notes.  Pertinent labs & imaging results that were available during my care of the patient were reviewed by me and considered in my medical decision making (see chart for details).    MDM Rules/Calculators/A&P     CHA2DS2/VAS Stroke Risk Points      N/A >= 2 Points: High Risk  1 - 1.99 Points: Medium Risk  0 Points: Low Risk    A final score could not be computed because of missing components.: Last  Change: N/A     This score determines the patient's risk of having a stroke if the  patient has atrial fibrillation.      This score is not applicable to this patient. Components are not  calculated.                   Patient presents to the emergency department for evaluation of a shaking episode.  He reports that he was seen by his doctor yesterday and diagnosed with pneumonia.  He was started on Levaquin.  Tonight he had uncontrollable shaking and presented to the ER with rigors, temperature of 103.  Patient with tachycardia and tachypnea but not significantly hypoxic.  CT PE study performed.  No evidence of PE but significant disease throughout the lungs, suspect Covid pneumonitis.  Will admit.  Final Clinical Impression(s) / ED Diagnoses Final diagnoses:  Pneumonitis     Rx / DC Orders ED Discharge Orders    None       Leibish Mcgregor, Gwenyth Allegra, MD 01/13/19 [phone]

## 2019-01-13 NOTE — ED Notes (Signed)
Pt transported to CT ?

## 2019-01-13 NOTE — ED Notes (Signed)
Mika, from [facility] lab phones at this time to tell us pt. COVID came back POSITIVE. I will inform the hospitalist of this.

## 2019-01-13 NOTE — H&P (Addendum)
History and Physical    Vincent [name] [id number] DOB: May 24, 1945 DOA: 01/12/2019  PCP: Biagio Borg, MD  Patient coming from: home  I have personally briefly reviewed patient's old medical records in [place], chills  HPI: Vincent [name] is a 73 y.o. male with medical history significant of blindness (Hx of retinal detachment and cataracts since childhood), GERD, HLD, Osteoarthritis and BPH who presents with one week of fevers, chills.  Patient reports prior to onset of symptoms he thought he may have had a COVID exposure.  Since symptom onset it has just been he and his wife.  He reports he has been having headaches, fevers/chills, myalgias, feeling week.  He has had diarrhea for a couple of days.  He denies any cough or sob.  He reports he had seen his PCP, Dr. Jenny Reichmann, in the interval and was prescribed Levaquin, which he took the first dose yesterday.    He denies nausea/vomiting, any other pain, no dysuria  He does not smoke, drink or use recreational drugs    Review of Systems: As per HPI otherwise 10 point review of systems negative.    Past Medical History:  Diagnosis Date  . Allergic rhinitis, cause unspecified 02/08/2011  . Anemia, unspecified 04/11/2011  . Arthritis    knee  . 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 BPH (benign prostatic hypertrophy) 02/08/2011  . Cataract    as a child, blind since 18 or 1 yrs old  . 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 Diabetes mellitus type 2, diet-controlled ([place])   . Diverticulosis   . Elevated PSA 02/08/2011  . GERD (gastroesophageal reflux disease) 02/08/2011  . 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/2011   pt unaware  . History of colon polyps   . History of kidney stones   . History of prostatitis 04/11/2011  . Hyperlipidemia   . Lumbar disc disease 02/06/2011  . Vitamin D deficiency   . Wears partial dentures    upper and lower    Past  Surgical History:  Procedure Laterality Date  . BACK SURGERY     x 3  L4-L5  . COLONOSCOPY  2007   Dr Wynetta Emery  . CYST EXCISION    . THULIUM LASER TURP (TRANSURETHRAL RESECTION OF PROSTATE) N/A 10/27/2018   Procedure: THULIUM LASER TURP (TRANSURETHRAL RESECTION OF PROSTATE);  Surgeon: Festus Aloe, MD;  Location: [facility];  Service: Urology;  Laterality: N/A;  . WISDOM TOOTH EXTRACTION       reports that he quit smoking about 55 years ago. His smoking use included cigarettes. He has never used smokeless tobacco. He reports that he does not drink alcohol or use drugs.  Allergies  Allergen Reactions  . Chlorhexidine     Family History  Problem Relation Age of Onset  . Blindness Other   . Heart disease Other   . Hypertension Other   . Diabetes Other   . Mental illness Other   . Sudden death Other   . Colon cancer Neg Hx   . Esophageal cancer Neg Hx   . Stomach cancer Neg Hx   . Rectal cancer Neg Hx      Prior to Admission medications   Medication Sig Start Date End Date Taking? Authorizing Provider  aspirin 81 MG EC tablet Take 1 tablet (81 mg total) by mouth daily. Swallow whole. 08/01/12  Yes Biagio Borg, MD  finasteride (PROSCAR) 5 MG tablet TK  1 T PO QHS Patient taking differently: Take 5 mg by mouth daily.  04/29/17  Yes Biagio Borg, MD  lansoprazole (PREVACID) 30 MG capsule TAKE 1 CAPSULE(30 MG) BY MOUTH DAILY Patient taking differently: Take 30 mg by mouth at bedtime.  09/21/18  Yes Biagio Borg, MD  levofloxacin (LEVAQUIN) 500 MG tablet Take 1 tablet (500 mg total) by mouth daily for 10 days. 01/11/19 01/21/19 Yes Biagio Borg, MD  rosuvastatin (CRESTOR) 20 MG tablet Take 20 mg by mouth every evening.   Yes [provider]  sulfamethoxazole-trimethoprim (BACTRIM DS) 800-160 MG tablet Take 1 tablet by mouth 2 (two) times daily. 01/02/19  Yes [provider]  Vitamin D, Ergocalciferol, (DRISDOL) 1.25 MG (50000 UT) CAPS capsule  Take 1 capsule (50,000 Units total) by mouth every 7 (seven) days. 07/11/18  Yes Biagio Borg, MD  cephALEXin (KEFLEX) 500 MG capsule Take 1 capsule (500 mg total) by mouth at bedtime. 10/27/18   Festus Aloe, MD  ibuprofen (ADVIL,MOTRIN) 800 MG tablet TAKE 1 TABLET(800 MG) BY MOUTH EVERY 8 HOURS AS NEEDED FOR PAIN Patient taking differently: Take 800 mg by mouth every 8 (eight) hours as needed for moderate pain.  05/05/18   Biagio Borg, MD  meloxicam (MOBIC) 7.5 MG tablet Take 1-2 tablets (7.5-15 mg total) by mouth daily. Patient not taking: Reported on 10/27/2018 09/12/17   Mancel Bale, PA-C  tamsulosin (FLOMAX) 0.4 MG CAPS capsule Take 0.4 mg by mouth every evening.  07/12/14   [provider]    Physical Exam: Vitals:   01/13/19 0630 01/13/19 0700 01/13/19 0730 01/13/19 0800  BP: 111/75 (!) 129/91 115/87 116/87  Pulse: (!) 114 (!) 114 (!) 110 (!) 113  Resp: (!) 26 (!) 28 (!) 25 (!) 23  Temp:      TempSrc:      SpO2: 94% 94% 96% 96%     Vitals:   01/13/19 0630 01/13/19 0700 01/13/19 0730 01/13/19 0800  BP: 111/75 (!) 129/91 115/87 116/87  Pulse: (!) 114 (!) 114 (!) 110 (!) 113  Resp: (!) 26 (!) 28 (!) 25 (!) 23  Temp:      TempSrc:      SpO2: 94% 94% 96% 96%   Constitutional: NAD, calm, comfortable Eyes: nystagmus, blind in both eyes ENMT: Mucous membranes are moist. Posterior pharynx clear of any exudate or lesions.Normal dentition.  Neck: normal, supple, no masses, no thyromegaly Respiratory: few basilar crackles, no wheezing, good effort Cardiovascular: Regular rate and rhythm, no murmurs / rubs / gallops. No extremity edema. 2+ pedal pulses Abdomen: no tenderness, no masses palpated. No hepatosplenomegaly. Bowel sounds positive.  Musculoskeletal: no clubbing / cyanosis. No joint deformity upper and lower extremities. Good ROM, no contractures. Normal muscle tone.  Skin: no rashes, lesions, ulcers. No induration Neurologic: Strength and sensation grossly  intact Psychiatric: Normal judgment and insight. Alert and oriented x 3. Normal mood.     Labs on Admission: I have personally reviewed following labs and imaging studies  CBC: Recent Labs  Lab 01/11/19 0955 01/13/19 0056  WBC 5.3 6.9  NEUTROABS 3.7 5.5  HGB 13.6 13.1  HCT 42.2 41.9  MCV 85.2 86.9  PLT 236.0 [id number]   Basic Metabolic Panel: Recent Labs  Lab 01/11/19 0955 01/13/19 0056  NA 132* 135  K 4.3 4.3  CL 97 98  CO2 23 23  GLUCOSE 151* 132*  BUN 20 21  CREATININE 1.36 1.19  CALCIUM 9.4 9.1   GFR:  CrCl cannot be calculated (Unknown ideal weight.). Liver Function Tests: Recent Labs  Lab 01/11/19 0955 01/13/19 0056  AST 53* 84*  ALT 37 61*  ALKPHOS 60 55  BILITOT 0.6 0.8  PROT 8.0 7.7  ALBUMIN 3.9 3.3*   Recent Labs  Lab 01/11/19 0955  LIPASE 23.0   No results for input(s): AMMONIA in the last 168 hours. Coagulation Profile: No results for input(s): INR, PROTIME in the last 168 hours. Cardiac Enzymes: No results for input(s): CKTOTAL, CKMB, CKMBINDEX, TROPONINI in the last 168 hours. BNP (last 3 results) No results for input(s): PROBNP in the last 8760 hours. HbA1C: No results for input(s): HGBA1C in the last 72 hours. CBG: No results for input(s): GLUCAP in the last 168 hours. Lipid Profile: Recent Labs    01/13/19 0056  TRIG 102   Thyroid Function Tests: Recent Labs    01/11/19 0955  TSH 1.31   Anemia Panel: Recent Labs    01/13/19 0056  FERRITIN 1,061*   Urine analysis:    Component Value Date/Time   COLORURINE YELLOW 01/11/2019 0955   APPEARANCEUR Sl Cloudy (A) 01/11/2019 0955   LABSPEC >=1.030 (A) 01/11/2019 0955   PHURINE 6.0 01/11/2019 0955   GLUCOSEU NEGATIVE 01/11/2019 0955   HGBUR TRACE-LYSED (A) 01/11/2019 0955   BILIRUBINUR SMALL (A) 01/11/2019 0955   BILIRUBINUR negative 05/20/2016 0911   KETONESUR TRACE (A) 01/11/2019 0955   PROTEINUR 30 05/20/2016 0911   PROTEINUR NEGATIVE 12/05/2012 0417   UROBILINOGEN 1.0  01/11/2019 0955   NITRITE NEGATIVE 01/11/2019 0955   LEUKOCYTESUR NEGATIVE 01/11/2019 0955    Radiological Exams on Admission: DG Chest 2 View  Result Date: 01/11/2019 CLINICAL DATA:  Myalgia. EXAM: CHEST - 2 VIEW COMPARISON:  Prior chest x-ray report 11/22/2000. FINDINGS: Mediastinum hilar structures normal. Bilateral interstitial prominence. No pleural effusion or pneumothorax. Elevation left hemidiaphragm. Scratched it mild elevation left hemidiaphragm. IMPRESSION: Bilateral pulmonary interstitial prominence consistent with an active process such as pneumonitis. Electronically Signed   By: Marcello Moores  Register   On: 01/11/2019 13:44   CT ANGIO CHEST PE W OR WO CONTRAST  Result Date: 01/13/2019 CLINICAL DATA:  73 year old male with suspected acute pneumonia. Started antibiotics yesterday or today. Abnormal D-dimer. EXAM: CT ANGIOGRAPHY CHEST WITH CONTRAST TECHNIQUE: Multidetector CT imaging of the chest was performed using the standard protocol during bolus administration of intravenous contrast. Multiplanar CT image reconstructions and MIPs were obtained to evaluate the vascular anatomy. CONTRAST:  5mL OMNIPAQUE IOHEXOL 350 MG/ML SOLN COMPARISON:  Portable chest 0015 hours today. Chest radiographs 01/11/2019. FINDINGS: Cardiovascular: Adequate contrast bolus timing in the pulmonary arterial tree. Respiratory motion artifact at the lung bases. No central or hilar pulmonary artery filling defect. The upper lobe pulmonary arteries appear patent. No middle or lower lobe pulmonary embolus identified although some branch detail is degraded by motion. No cardiomegaly or pericardial effusion. Negative visible aorta. Mediastinum/Nodes: Negative. No lymphadenopathy. Lungs/Pleura: Major airways are patent. There is scattered bilateral abnormal pulmonary opacity in the form of peripheral ground-glass and early consolidation scattered in all lobes. Some superimposed dependent atelectasis is possible in the lung  bases. No pleural effusion. Upper Abdomen: Negative visible liver, gallbladder, spleen, pancreas, adrenal glands, kidneys and bowel in the upper abdomen. Musculoskeletal: Negative for age. Review of the MIP images confirms the above findings. IMPRESSION: 1. No pulmonary embolus identified, but some lower lobe branch detail is degraded by motion artifact. 2. Widely scattered bilateral pulmonary opacity in a pattern suggestive of COVID-19 pneumonia. Other viral/atypical infectious etiology  is possible. No pleural effusion. Electronically Signed   By: Genevie Ann M.D.   On: 01/13/2019 07:47   DG Chest Port 1 View  Result Date: 01/13/2019 CLINICAL DATA:  Fever and chills EXAM: PORTABLE CHEST 1 VIEW COMPARISON:  01/11/2019 FINDINGS: Cardiac shadows within normal limits. The lungs are well aerated bilaterally. Mild interstitial changes are again identified in the bases predominately similar to that seen on the prior exam. No new focal infiltrate is seen. No sizable effusion is noted. IMPRESSION: Stable bibasilar interstitial markings which may represent some pneumonitis. This is stable from the recent exam. Electronically Signed   By: Inez Catalina M.D.   On: 01/13/2019 00:44      Assessment/Plan  Alexader Sutherland is a 73 y.o. male with medical history significant of blindness (Hx of retinal detachment and cataracts since childhood), GERD, HLD, Osteoarthritis and BPH who presents with one week of fevers, chills, CXR/CTA and inflammatory markers concerning for COVID19.  # Sepsis 2/2 Viral Pneumonia - suspect possible viral etiology, given inflammatory markers and reported exposure, COVID is the major concern.  His procalcitonin is low, thus suspicion for bacterial pneumonia is lower.  His CXR and CTA (no PE) have findings suggest of COVID-19 pneumonia. - will follow up COVID PCR, Resp viral panel, Strep / Legionella and Blood Cx - monitor off abx for now - started steroids, monitor inflammatory markers.   His oxygenation has been borderline, dipped to 90% on room air but currently off of oxygen supplementation - COVID returned positive, sats 90-92%, started remdesivir  # GERD - continue PPI  # HLD - continue statin  # BPH - continue tamsulosin and finasteride  DVT prophylaxis: Lovenox Code Status: Full Family Communication: Patient notified wife Admission status: inpatient   Truddie Hidden MD [facility] Pager [phone]  If 7PM-7AM, please contact night-coverage [email or web address] Password [email or web address]  01/13/2019, 8:09 AM

## 2019-01-13 NOTE — ED Notes (Signed)
I have just given report to Maudie Mercury, RN on [address]; and will transport shortly.

## 2019-01-13 NOTE — ED Triage Notes (Signed)
73 yo Male BIB [facility] from home. C/O of fever and chills. Pt states he was diagnosed with pneumonia yesterday by PCP. Pt states he was prescribed antibiotics that were started today. Fever was 100.2 per EMS. Pt is alert and oriented   Vitals: Hr 120 Bp 114 palpated cbg 142 rr 20 spo2 98 on room air

## 2019-01-13 NOTE — ED Notes (Signed)
Pt did not want to eat any food at this time, pt requested cup of water, provided to pt.

## 2019-01-13 NOTE — ED Notes (Signed)
[phone] wife: Deneise Lever.

## 2019-01-13 NOTE — ED Notes (Signed)
Urinal at bedside. Pt advised to call for assistance as needed for U/A collection per MD order. [employer]

## 2019-01-13 NOTE — ED Notes (Signed)
Pt states he was seen by PCP and diagnosed with pneumonia yesterday. Pt also states he started his antibiotics today.

## 2019-01-14 DIAGNOSIS — K219 Gastro-esophageal reflux disease without esophagitis: Secondary | ICD-10-CM

## 2019-01-14 LAB — CBC WITH DIFFERENTIAL/PLATELET
Abs Immature Granulocytes: 0.05 10*3/uL (ref 0.00–0.07)
Basophils Absolute: 0 10*3/uL (ref 0.0–0.1)
Basophils Relative: 0 %
Eosinophils Absolute: 0 10*3/uL (ref 0.0–0.5)
Eosinophils Relative: 0 %
HCT: 40.8 % (ref 39.0–52.0)
Hemoglobin: 12.6 g/dL — ABNORMAL LOW (ref 13.0–17.0)
Immature Granulocytes: 1 %
Lymphocytes Relative: 10 %
Lymphs Abs: 0.7 10*3/uL (ref 0.7–4.0)
MCH: 27.3 pg (ref 26.0–34.0)
MCHC: 30.9 g/dL (ref 30.0–36.0)
MCV: 88.3 fL (ref 80.0–100.0)
Monocytes Absolute: 0.4 10*3/uL (ref 0.1–1.0)
Monocytes Relative: 5 %
Neutro Abs: 5.8 10*3/uL (ref 1.7–7.7)
Neutrophils Relative %: 84 %
Platelets: 288 10*3/uL (ref 150–400)
RBC: 4.62 MIL/uL (ref 4.22–5.81)
RDW: 13.3 % (ref 11.5–15.5)
WBC: 6.9 10*3/uL (ref 4.0–10.5)
nRBC: 0 % (ref 0.0–0.2)

## 2019-01-14 LAB — MAGNESIUM: Magnesium: 2.4 mg/dL (ref 1.7–2.4)

## 2019-01-14 LAB — COMPREHENSIVE METABOLIC PANEL
ALT: 70 U/L — ABNORMAL HIGH (ref 0–44)
AST: 90 U/L — ABNORMAL HIGH (ref 15–41)
Albumin: 3.2 g/dL — ABNORMAL LOW (ref 3.5–5.0)
Alkaline Phosphatase: 52 U/L (ref 38–126)
Anion gap: 11 (ref 5–15)
BUN: 21 mg/dL (ref 8–23)
CO2: 23 mmol/L (ref 22–32)
Calcium: 9.1 mg/dL (ref 8.9–10.3)
Chloride: 103 mmol/L (ref 98–111)
Creatinine, Ser: 1.13 mg/dL (ref 0.61–1.24)
GFR calc Af Amer: >60 mL/min (ref >60)
GFR calc non Af Amer: >60 mL/min (ref >60)
Glucose, Bld: 156 mg/dL — ABNORMAL HIGH (ref 70–99)
Potassium: 4.9 mmol/L (ref 3.5–5.1)
Sodium: 137 mmol/L (ref 135–145)
Total Bilirubin: 1.1 mg/dL (ref 0.3–1.2)
Total Protein: 7.3 g/dL (ref 6.5–8.1)

## 2019-01-14 LAB — D-DIMER, QUANTITATIVE: D-Dimer, Quant: 2.57 ug/mL-FEU — ABNORMAL HIGH (ref 0.00–0.50)

## 2019-01-14 LAB — C-REACTIVE PROTEIN: CRP: 6.9 mg/dL — ABNORMAL HIGH (ref <1.0)

## 2019-01-14 LAB — FERRITIN: Ferritin: 1084 ng/mL — ABNORMAL HIGH (ref 24–336)

## 2019-01-14 LAB — LACTIC ACID, PLASMA: Lactic Acid, Venous: 2.8 mmol/L (ref 0.5–1.9)

## 2019-01-14 LAB — PHOSPHORUS: Phosphorus: 2.3 mg/dL — ABNORMAL LOW (ref 2.5–4.6)

## 2019-01-14 MED ORDER — SODIUM CHLORIDE 0.9 % IV SOLN
INTRAVENOUS | Status: AC
  Administered 2019-01-14: 09:00:00 via INTRAVENOUS

## 2019-01-14 MED ORDER — HYDRALAZINE HCL 10 MG PO TABS
10.0000 mg | ORAL_TABLET | Freq: Three times a day (TID) | ORAL | Status: DC | PRN
  Administered 2019-01-15: 10 mg via ORAL
  Filled 2019-01-14: qty 1

## 2019-01-14 NOTE — Plan of Care (Addendum)
Pt remained in Yellow or Red MEWS for the entire shift D/T his RR and HR. Pt was not in distress. His O@ SAT's were in the mid 90's on RA. On-call was notified and an order was given for a 500 ml bolus of NS. This action did not have a great deal of impact on pt's VS. Pt's LA increased from 2.3 yesterday to 2.8 this am. Pt was diaphoretic during shift and required 2 linen changes.

## 2019-01-14 NOTE — Progress Notes (Signed)
PHARMACY - PHYSICIAN COMMUNICATION CRITICAL VALUE ALERT - BLOOD CULTURE IDENTIFICATION (BCID)  Vincent [name] is an 74 y.o. male who presented to [facility] on 01/12/2019 with a chief complaint of fever, chills (+Covid)  Assessment:  1/4 bottles from blood cultures = + GPC   Name of physician (or Provider) Contacted: Dr Horris Latino  Current antibiotics: Remdesivir   Changes to prescribed antibiotics recommended:  Patient is on recommended antibiotics - No changes needed Blood culture likely a contaminant  No results found for this or any previous visit.  Everette Rank, PharmD 01/14/2019  7:14 AM

## 2019-01-14 NOTE — Progress Notes (Signed)
PROGRESS NOTE  Vincent [name] [id number] DOB: 10/19/1945 DOA: 01/12/2019 PCP: Biagio Borg, MD  HPI/Recap of past 24 hours: HPI from Dr Christell Faith Vincent [name] is a 73 y.o. 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 Pt reports he had seen his PCP, Dr. [name], in the interval and was prescribed Levaquin, which he took the first dose a day before coming to the ED. Pt found to be positive for covid 19 virus. Patient admitted for further management.    Today, patient denies any new complaints, denies any shortness of breath, chest pain, still reports some chills and myalgias.  Reports ongoing diarrhea   Assessment/Plan: Active Problems:   Viral pneumonia   Pneumonia due to COVID-19 virus   Pneumonia 2/2 COVID-19 virus Currently afebrile, with no leukocytosis Currently not requiring any supplemental oxygen Inflammatory markers elevated Procalcitonin negative Lactic acid elevated at 2.8, continue IVF D-dimer elevated BC x2, with 1/4 growing coagulase-negative, likely contaminant, repeat BC x2 pending CTA chest showed no PE, but some lower lobe detail is degraded by motion artifact, bilateral pulmonary opacity suggestive of COVID-19 pneumonia Continue remdesivir, Decadron Continue supplemental oxygen as needed, cough suppressants, inhalers, vitamins  Diarrhea Likely due to above GI panel pending Continue IV fluids Monitor closely  GERD Continue PPI  Hyperlipidemia Continue statins  BPH Continue tamsulosin, finasteride       Malnutrition Type:      Malnutrition Characteristics:      Nutrition Interventions:       Estimated body mass index is 24.74 kg/m as calculated from the following:   Height as of this encounter: 5\' 9"  (1.753 m).   Weight as of this encounter: 76 kg.     Code  Status: Full  Family Communication: None at bedside  Disposition Plan: To be determined   Consultants:  None  Procedures:  None  Antimicrobials:  None  DVT prophylaxis: Lovenox   Objective: Vitals:   01/14/19 0121 01/14/19 0500 01/14/19 0904 01/14/19 1222  BP: (!) 147/100 (!) 150/101 (!) 143/99 (!) 141/99  Pulse: (!) 106 (!) 101 (!) 108 (!) 102  Resp: (!) 26 (!) 22 16 16   Temp: 98.2 F (36.8 C) 98.6 F (37 C) 98.4 F (36.9 C) 97.9 F (36.6 C)  TempSrc: Oral Oral Oral Oral  SpO2: 96% 100% 96% 95%  Weight:      Height:        Intake/Output Summary (Last 24 hours) at 01/14/2019 1527 Last data filed at 01/14/2019 1100 Gross per 24 hour  Intake 1400 ml  Output --  Net 1400 ml   Filed Weights   01/13/19 1856  Weight: 76 kg    Exam:  General: NAD, blind in both eyes  Cardiovascular: S1, S2 present  Respiratory:  Diminished breath sounds bilaterally  Abdomen: Soft, nontender, nondistended, bowel sounds present  Musculoskeletal: No bilateral pedal edema noted  Skin: Normal  Psychiatry: Normal mood    Data Reviewed: CBC: Recent Labs  Lab 01/11/19 0955 01/13/19 0056 01/14/19 0254  WBC 5.3 6.9 6.9  NEUTROABS 3.7 5.5 5.8  HGB 13.6 13.1 12.6*  HCT 42.2 41.9 40.8  MCV 85.2 86.9 88.3  PLT 236.0 268 [id number]   Basic Metabolic Panel: Recent Labs  Lab 01/11/19 0955 01/13/19 0056 01/14/19 0254  NA 132* 135 137  K 4.3 4.3 4.9  CL 97 98 103  CO2 23 23  23  GLUCOSE 151* 132* 156*  BUN 20 21 21   CREATININE 1.36 1.19 1.13  CALCIUM 9.4 9.1 9.1  MG  --   --  2.4  PHOS  --   --  2.3*   GFR: Estimated Creatinine Clearance: 59.1 mL/min (by C-G formula based on SCr of 1.13 mg/dL). Liver Function Tests: Recent Labs  Lab 01/11/19 0955 01/13/19 0056 01/14/19 0254  AST 53* 84* 90*  ALT 37 61* 70*  ALKPHOS 60 55 52  BILITOT 0.6 0.8 1.1  PROT 8.0 7.7 7.3  ALBUMIN 3.9 3.3* 3.2*   Recent Labs  Lab 01/11/19 0955  LIPASE 23.0   No results for  input(s): AMMONIA in the last 168 hours. Coagulation Profile: No results for input(s): INR, PROTIME in the last 168 hours. Cardiac Enzymes: No results for input(s): CKTOTAL, CKMB, CKMBINDEX, TROPONINI in the last 168 hours. BNP (last 3 results) No results for input(s): PROBNP in the last 8760 hours. HbA1C: No results for input(s): HGBA1C in the last 72 hours. CBG: No results for input(s): GLUCAP in the last 168 hours. Lipid Profile: Recent Labs    01/13/19 0056  TRIG 102   Thyroid Function Tests: No results for input(s): TSH, T4TOTAL, FREET4, T3FREE, THYROIDAB in the last 72 hours. Anemia Panel: Recent Labs    01/13/19 0056 01/14/19 0929  FERRITIN 1,061* 1,084*   Urine analysis:    Component Value Date/Time   COLORURINE YELLOW 01/13/2019 0056   APPEARANCEUR CLEAR 01/13/2019 0056   LABSPEC >1.046 (H) 01/13/2019 0056   PHURINE 5.0 01/13/2019 0056   GLUCOSEU NEGATIVE 01/13/2019 0056   GLUCOSEU NEGATIVE 01/11/2019 0955   HGBUR NEGATIVE 01/13/2019 0056   BILIRUBINUR NEGATIVE 01/13/2019 0056   BILIRUBINUR negative 05/20/2016 0911   KETONESUR NEGATIVE 01/13/2019 0056   PROTEINUR 30 (A) 01/13/2019 0056   UROBILINOGEN 1.0 01/11/2019 0955   NITRITE NEGATIVE 01/13/2019 0056   LEUKOCYTESUR NEGATIVE 01/13/2019 0056   Sepsis Labs: @LABRCNTIP (procalcitonin:4,lacticidven:4)  ) Recent Results (from the past 240 hour(s))  Urine culture     Status: None   Collection Time: 01/11/19  9:55 AM   Specimen: Blood  Result Value Ref Range Status   MICRO NUMBER: [id number]  Final   SPECIMEN QUALITY: Adequate  Final   Sample Source NOT GIVEN  Final   STATUS: FINAL  Final   ISOLATE 1:   Final    Growth of mixed flora was isolated, suggesting probable contamination. No further testing will be performed. If clinically indicated, recollection using a method to minimize contamination, with prompt transfer to Urine Culture Transport Tube, is  recommended.   Blood Culture (routine x 2)      Status: None (Preliminary result)   Collection Time: 01/13/19 12:56 AM   Specimen: BLOOD  Result Value Ref Range Status   Specimen Description   Final    BLOOD RIGHT ANTECUBITAL Performed at [facility] [address] [place], [place] [address]    Special Requests   Final    BOTTLES DRAWN AEROBIC AND ANAEROBIC Blood Culture adequate volume Performed at [facility] [address] [place], [place] [address]    Culture   Final    NO GROWTH 1 DAY Performed at [facility], [place] [address], [place], [place] [address]    Report Status PENDING  Incomplete  Blood Culture (routine x 2)     Status: Abnormal (Preliminary result)   Collection Time: 01/13/19 12:56 AM   Specimen: BLOOD  Result Value Ref  Range Status   Specimen Description   Final    BLOOD LEFT ANTECUBITAL Performed at [facility] [address] [place], [place] [address]    Special Requests   Final    BOTTLES DRAWN AEROBIC AND ANAEROBIC Blood Culture adequate volume Performed at [facility] [address] [place], [place] [address]    Culture  Setup Time   Final    GRAM POSITIVE COCCI ANAEROBIC BOTTLE ONLY CRITICAL RESULT CALLED TO, READ BACK BY AND VERIFIED WITH: PHARMD POINDEXTER, L. 0703 [id number] FCP    Culture (A)  Final    STAPHYLOCOCCUS SPECIES (COAGULASE NEGATIVE) THE SIGNIFICANCE OF ISOLATING THIS ORGANISM FROM A SINGLE SET OF BLOOD CULTURES WHEN MULTIPLE SETS ARE DRAWN IS UNCERTAIN. PLEASE NOTIFY THE MICROBIOLOGY DEPARTMENT WITHIN ONE WEEK IF SPECIATION AND SENSITIVITIES ARE REQUIRED. Performed at [facility], [place] [address], [place], [place] [address]    Report Status PENDING  Incomplete  SARS CORONAVIRUS 2 (TAT 6-24 HRS) Nasopharyngeal Nasopharyngeal Swab     Status: Abnormal   Collection Time: 01/13/19  4:59 AM   Specimen: Nasopharyngeal Swab  Result Value Ref Range Status   SARS Coronavirus 2 POSITIVE (A)  NEGATIVE Final    Comment: RESULT CALLED TO, READ BACK BY AND VERIFIED WITH: TIM SMITH RN.@1420  ON 12.12.2020 BY TCALDWELL MT. (NOTE) SARS-CoV-2 target nucleic acids are DETECTED. The SARS-CoV-2 RNA is generally detectable in upper and lower respiratory specimens during the acute phase of infection. Positive results are indicative of the presence of SARS-CoV-2 RNA. Clinical correlation with patient history and other diagnostic information is  necessary to determine patient infection status. Positive results do not rule out bacterial infection or co-infection with other viruses.  The expected result is Negative. Fact Sheet for Patients: SugarRoll.be Fact Sheet for Healthcare Providers: https://www.woods-mathews.com/ This test is not yet approved or cleared by the [place] FDA and  has been authorized for detection and/or diagnosis of SARS-CoV-2 by FDA under an Emergency Use Authorization (EUA). This EUA will remain  in effect (meaning this test can be  used) for the duration of the COVID-19 declaration under Section 564(b)(1) of the Act, 21 U.S.C. section 360bbb-3(b)(1), unless the authorization is terminated or revoked sooner. Performed at [facility], [place] [address], [place], [place] [address]   Respiratory Panel by PCR     Status: None   Collection Time: 01/13/19  8:42 AM   Specimen: Nasopharyngeal Swab; Respiratory  Result Value Ref Range Status   Adenovirus NOT DETECTED NOT DETECTED Final   Coronavirus 229E NOT DETECTED NOT DETECTED Final    Comment: (NOTE) The Coronavirus on the Respiratory Panel, DOES NOT test for the novel  Coronavirus (2019 nCoV)    Coronavirus HKU1 NOT DETECTED NOT DETECTED Final   Coronavirus NL63 NOT DETECTED NOT DETECTED Final   Coronavirus OC43 NOT DETECTED NOT DETECTED Final   Metapneumovirus NOT DETECTED NOT DETECTED Final   Rhinovirus / Enterovirus NOT DETECTED NOT DETECTED Final   Influenza  A NOT DETECTED NOT DETECTED Final   Influenza B NOT DETECTED NOT DETECTED Final   Parainfluenza Virus 1 NOT DETECTED NOT DETECTED Final   Parainfluenza Virus 2 NOT DETECTED NOT DETECTED Final   Parainfluenza Virus 3 NOT DETECTED NOT DETECTED Final   Parainfluenza Virus 4 NOT DETECTED NOT DETECTED Final   Respiratory Syncytial Virus NOT DETECTED NOT DETECTED Final   Bordetella pertussis NOT DETECTED NOT DETECTED Final   Chlamydophila pneumoniae NOT DETECTED NOT DETECTED Final  Mycoplasma pneumoniae NOT DETECTED NOT DETECTED Final    Comment: Performed at [facility], [place] [address] [place], [place] [address]  Culture, blood (routine x 2)     Status: None (Preliminary result)   Collection Time: 01/14/19  9:32 AM   Specimen: BLOOD RIGHT ARM  Result Value Ref Range Status   Specimen Description BLOOD RIGHT ARM  Final   Special Requests   Final    BOTTLES DRAWN AEROBIC ONLY Blood Culture adequate volume Performed at [facility], [place] [address], [place], [place] [address]    Culture PENDING  Incomplete   Report Status PENDING  Incomplete  Culture, blood (routine x 2)     Status: None (Preliminary result)   Collection Time: 01/14/19  9:35 AM   Specimen: BLOOD  Result Value Ref Range Status   Specimen Description BLOOD RIGHT ANTECUBITAL  Final   Special Requests   Final    BOTTLES DRAWN AEROBIC ONLY Blood Culture adequate volume Performed at [facility], [place] [address], [place], [place] [address]    Culture PENDING  Incomplete   Report Status PENDING  Incomplete      Studies: No results found.  Scheduled Meds: . aspirin EC  81 mg Oral Daily  . dexamethasone  6 mg Oral Q24H  . enoxaparin (LOVENOX) injection  40 mg Subcutaneous Q24H  . finasteride  5 mg Oral Daily  . Ipratropium-Albuterol  1 puff Inhalation Q6H  . pantoprazole  40 mg Oral Daily  . rosuvastatin  20 mg Oral QPM  . tamsulosin  0.4 mg Oral QPM  . vitamin C  500 mg Oral Daily  . zinc sulfate   220 mg Oral Daily    Continuous Infusions: . sodium chloride 75 mL/hr at 01/14/19 0929  . remdesivir 100 mg in NS 100 mL 100 mg (01/14/19 0933)     LOS: 1 day     Alma Friendly, MD [facility]  If 7PM-7AM, please contact night-coverage [email or web address] 01/14/2019, 3:27 PM

## 2019-01-15 ENCOUNTER — Telehealth: Payer: Self-pay | Admitting: Internal Medicine

## 2019-01-15 LAB — CBC WITH DIFFERENTIAL/PLATELET
Abs Immature Granulocytes: 0.09 10*3/uL — ABNORMAL HIGH (ref 0.00–0.07)
Basophils Absolute: 0 10*3/uL (ref 0.0–0.1)
Basophils Relative: 0 %
Eosinophils Absolute: 0 10*3/uL (ref 0.0–0.5)
Eosinophils Relative: 0 %
HCT: 37.9 % — ABNORMAL LOW (ref 39.0–52.0)
Hemoglobin: 11.8 g/dL — ABNORMAL LOW (ref 13.0–17.0)
Immature Granulocytes: 1 %
Lymphocytes Relative: 8 %
Lymphs Abs: 0.7 10*3/uL (ref 0.7–4.0)
MCH: 26.9 pg (ref 26.0–34.0)
MCHC: 31.1 g/dL (ref 30.0–36.0)
MCV: 86.3 fL (ref 80.0–100.0)
Monocytes Absolute: 0.5 10*3/uL (ref 0.1–1.0)
Monocytes Relative: 5 %
Neutro Abs: 7.7 10*3/uL (ref 1.7–7.7)
Neutrophils Relative %: 86 %
Platelets: 323 10*3/uL (ref 150–400)
RBC: 4.39 MIL/uL (ref 4.22–5.81)
RDW: 13.2 % (ref 11.5–15.5)
WBC: 9 10*3/uL (ref 4.0–10.5)
nRBC: 0 % (ref 0.0–0.2)

## 2019-01-15 LAB — COMPREHENSIVE METABOLIC PANEL
ALT: 80 U/L — ABNORMAL HIGH (ref 0–44)
AST: 85 U/L — ABNORMAL HIGH (ref 15–41)
Albumin: 2.7 g/dL — ABNORMAL LOW (ref 3.5–5.0)
Alkaline Phosphatase: 48 U/L (ref 38–126)
Anion gap: 10 (ref 5–15)
BUN: 25 mg/dL — ABNORMAL HIGH (ref 8–23)
CO2: 21 mmol/L — ABNORMAL LOW (ref 22–32)
Calcium: 9 mg/dL (ref 8.9–10.3)
Chloride: 104 mmol/L (ref 98–111)
Creatinine, Ser: 0.97 mg/dL (ref 0.61–1.24)
GFR calc Af Amer: >60 mL/min (ref >60)
GFR calc non Af Amer: >60 mL/min (ref >60)
Glucose, Bld: 198 mg/dL — ABNORMAL HIGH (ref 70–99)
Potassium: 4.4 mmol/L (ref 3.5–5.1)
Sodium: 135 mmol/L (ref 135–145)
Total Bilirubin: 0.6 mg/dL (ref 0.3–1.2)
Total Protein: 6.7 g/dL (ref 6.5–8.1)

## 2019-01-15 LAB — D-DIMER, QUANTITATIVE: D-Dimer, Quant: 1.86 ug/mL-FEU — ABNORMAL HIGH (ref 0.00–0.50)

## 2019-01-15 LAB — CULTURE, BLOOD (ROUTINE X 2): Special Requests: ADEQUATE

## 2019-01-15 LAB — MAGNESIUM: Magnesium: 2.3 mg/dL (ref 1.7–2.4)

## 2019-01-15 LAB — C-REACTIVE PROTEIN: CRP: 3.6 mg/dL — ABNORMAL HIGH (ref <1.0)

## 2019-01-15 LAB — LEGIONELLA PNEUMOPHILA SEROGP 1 UR AG: L. pneumophila Serogp 1 Ur Ag: NEGATIVE

## 2019-01-15 LAB — FERRITIN: Ferritin: 1109 ng/mL — ABNORMAL HIGH (ref 24–336)

## 2019-01-15 LAB — PHOSPHORUS: Phosphorus: 2.2 mg/dL — ABNORMAL LOW (ref 2.5–4.6)

## 2019-01-15 MED ORDER — AMLODIPINE BESYLATE 5 MG PO TABS
5.0000 mg | ORAL_TABLET | Freq: Every day | ORAL | Status: DC
  Administered 2019-01-15 – 2019-01-17 (×3): 5 mg via ORAL
  Filled 2019-01-15 (×3): qty 1

## 2019-01-15 NOTE — Progress Notes (Addendum)
PROGRESS NOTE  Vincent [name] [id number] DOB: [birth year] DOA: 01/12/2019 PCP: Vincent Borg, MD  HPI/Recap of past 24 hours: HPI from Vincent [name] is a 73 y.o. 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 Pt reports he had seen his PCP, Vincent. Vincent [name], in the interval and was prescribed Levaquin, which he took the first dose a day before coming to the ED. Pt found to be positive for covid 19 virus. Patient admitted for further management.    Today, patient denies any new complaints.   Assessment/Plan: Active Problems:   Viral pneumonia   Pneumonia due to COVID-19 virus   Pneumonia 2/2 COVID-19 virus Currently afebrile, with no leukocytosis Currently not requiring any supplemental oxygen Inflammatory markers elevated, will trend Procalcitonin negative Lactic acid elevated at 2.8, continue IVF D-dimer elevated BC x2, with 1/4 growing coagulase-negative, likely contaminant, repeat BC x2 pending CTA chest showed no PE, but some lower lobe detail is degraded by motion artifact, bilateral pulmonary opacity suggestive of COVID-19 pneumonia Continue remdesivir, Decadron Continue supplemental oxygen as needed, cough suppressants, inhalers, vitamins  Diarrhea Resolved Likely due to above  Elevated BP readings Not on any home BP meds Start amlodipine, hydralazine PO prn  GERD Continue PPI  Hyperlipidemia Continue statins  BPH Continue tamsulosin, finasteride       Malnutrition Type:      Malnutrition Characteristics:      Nutrition Interventions:       Estimated body mass index is 24.74 kg/m as calculated from the following:   Height as of this encounter: 5\' 9"  (1.753 m).   Weight as of this encounter: 76 kg.     Code Status: Full  Family Communication: None at  bedside  Disposition Plan: To be determined   Consultants:  None  Procedures:  None  Antimicrobials:  None  DVT prophylaxis: Lovenox   Objective: Vitals:   01/14/19 1222 01/14/19 2006 01/15/19 0449 01/15/19 1509  BP: (!) 141/99 (!) 144/98 (!) 148/107 (!) 154/97  Pulse: (!) 102 84 82 80  Resp: 16 20 16 16   Temp: 97.9 F (36.6 C) 98.5 F (36.9 C) 98.4 F (36.9 C) 97.9 F (36.6 C)  TempSrc: Oral Oral Oral Oral  SpO2: 95% 97% 96% 94%  Weight:      Height:        Intake/Output Summary (Last 24 hours) at 01/15/2019 1850 Last data filed at 01/15/2019 1240 Gross per 24 hour  Intake 760 ml  Output 500 ml  Net 260 ml   Filed Weights   01/13/19 1856  Weight: 76 kg    Exam:  General: NAD, blind in both eyes   Cardiovascular: S1, S2 present  Respiratory: CTAB  Abdomen: Soft, nontender, nondistended, bowel sounds present  Musculoskeletal: No bilateral pedal edema noted  Skin: Normal  Psychiatry: Normal mood   Data Reviewed: CBC: Recent Labs  Lab 01/11/19 0955 01/13/19 0056 01/14/19 0254 01/15/19 0259  WBC 5.3 6.9 6.9 9.0  NEUTROABS 3.7 5.5 5.8 7.7  HGB 13.6 13.1 12.6* 11.8*  HCT 42.2 41.9 40.8 37.9*  MCV 85.2 86.9 88.3 86.3  PLT 236.0 268 288 [id number]   Basic Metabolic Panel: Recent Labs  Lab 01/11/19 0955 01/13/19 0056 01/14/19 0254 01/15/19 0259  NA 132* 135 137 135  K 4.3 4.3 4.9 4.4  CL 97 98 103 104  CO2 23 23 23  21*  GLUCOSE 151* 132* 156* 198*  BUN 20 21 21  25*  CREATININE 1.36 1.19 1.13 0.97  CALCIUM 9.4 9.1 9.1 9.0  MG  --   --  2.4 2.3  PHOS  --   --  2.3* 2.2*   GFR: Estimated Creatinine Clearance: 68.8 mL/min (by C-G formula based on SCr of 0.97 mg/dL). Liver Function Tests: Recent Labs  Lab 01/11/19 0955 01/13/19 0056 01/14/19 0254 01/15/19 0259  AST 53* 84* 90* 85*  ALT 37 61* 70* 80*  ALKPHOS 60 55 52 48  BILITOT 0.6 0.8 1.1 0.6  PROT 8.0 7.7 7.3 6.7  ALBUMIN 3.9 3.3* 3.2* 2.7*   Recent Labs  Lab  01/11/19 0955  LIPASE 23.0   No results for input(s): AMMONIA in the last 168 hours. Coagulation Profile: No results for input(s): INR, PROTIME in the last 168 hours. Cardiac Enzymes: No results for input(s): CKTOTAL, CKMB, CKMBINDEX, TROPONINI in the last 168 hours. BNP (last 3 results) No results for input(s): PROBNP in the last 8760 hours. HbA1C: No results for input(s): HGBA1C in the last 72 hours. CBG: No results for input(s): GLUCAP in the last 168 hours. Lipid Profile: Recent Labs    01/13/19 0056  TRIG 102   Thyroid Function Tests: No results for input(s): TSH, T4TOTAL, FREET4, T3FREE, THYROIDAB in the last 72 hours. Anemia Panel: Recent Labs    01/14/19 0929 01/15/19 0259  FERRITIN 1,084* 1,109*   Urine analysis:    Component Value Date/Time   COLORURINE YELLOW 01/13/2019 0056   APPEARANCEUR CLEAR 01/13/2019 0056   LABSPEC >1.046 (H) 01/13/2019 0056   PHURINE 5.0 01/13/2019 0056   GLUCOSEU NEGATIVE 01/13/2019 0056   GLUCOSEU NEGATIVE 01/11/2019 0955   HGBUR NEGATIVE 01/13/2019 0056   BILIRUBINUR NEGATIVE 01/13/2019 0056   BILIRUBINUR negative 05/20/2016 0911   KETONESUR NEGATIVE 01/13/2019 0056   PROTEINUR 30 (A) 01/13/2019 0056   UROBILINOGEN 1.0 01/11/2019 0955   NITRITE NEGATIVE 01/13/2019 0056   LEUKOCYTESUR NEGATIVE 01/13/2019 0056   Sepsis Labs: @LABRCNTIP (procalcitonin:4,lacticidven:4)  ) Recent Results (from the past 240 hour(s))  Urine culture     Status: None   Collection Time: 01/11/19  9:55 AM   Specimen: Blood  Result Value Ref Range Status   MICRO NUMBER: [id number]  Final   SPECIMEN QUALITY: Adequate  Final   Sample Source NOT GIVEN  Final   STATUS: FINAL  Final   ISOLATE 1:   Final    Growth of mixed flora was isolated, suggesting probable contamination. No further testing will be performed. If clinically indicated, recollection using a method to minimize contamination, with prompt transfer to Urine Culture Transport Tube, is   recommended.   Blood Culture (routine x 2)     Status: None (Preliminary result)   Collection Time: 01/13/19 12:56 AM   Specimen: BLOOD  Result Value Ref Range Status   Specimen Description   Final    BLOOD RIGHT ANTECUBITAL Performed at [facility] [address], [place], [place] [address]    Special Requests   Final    BOTTLES DRAWN AEROBIC AND ANAEROBIC Blood Culture adequate volume Performed at [facility] [address] [place], [place] [address]    Culture   Final    NO GROWTH 2 DAYS Performed at [facility] [address], [place], [place] [address]    Report Status PENDING  Incomplete  Blood Culture (routine x 2)     Status: Abnormal   Collection Time: 01/13/19  12:56 AM   Specimen: BLOOD  Result Value Ref Range Status   Specimen Description   Final    BLOOD LEFT ANTECUBITAL Performed at [facility] [address] [place], [place] [address]    Special Requests   Final    BOTTLES DRAWN AEROBIC AND ANAEROBIC Blood Culture adequate volume Performed at [facility] [address] [place], [place] [address]    Culture  Setup Time   Final    GRAM POSITIVE COCCI ANAEROBIC BOTTLE ONLY CRITICAL RESULT CALLED TO, READ BACK BY AND VERIFIED WITH: PHARMD POINDEXTER, L. 0703 [id number] FCP    Culture (A)  Final    STAPHYLOCOCCUS SPECIES (COAGULASE NEGATIVE) THE SIGNIFICANCE OF ISOLATING THIS ORGANISM FROM A SINGLE SET OF BLOOD CULTURES WHEN MULTIPLE SETS ARE DRAWN IS UNCERTAIN. PLEASE NOTIFY THE MICROBIOLOGY DEPARTMENT WITHIN ONE WEEK IF SPECIATION AND SENSITIVITIES ARE REQUIRED. Performed at [facility], [place] [address], [place], [place] [address]    Report Status 01/15/2019 FINAL  Final  SARS CORONAVIRUS 2 (TAT 6-24 HRS) Nasopharyngeal Nasopharyngeal Swab     Status: Abnormal   Collection Time: 01/13/19  4:59 AM   Specimen: Nasopharyngeal Swab  Result Value Ref Range Status    SARS Coronavirus 2 POSITIVE (A) NEGATIVE Final    Comment: RESULT CALLED TO, READ BACK BY AND VERIFIED WITH: TIM SMITH RN.@1420  ON 12.12.2020 BY TCALDWELL MT. (NOTE) SARS-CoV-2 target nucleic acids are DETECTED. The SARS-CoV-2 RNA is generally detectable in upper and lower respiratory specimens during the acute phase of infection. Positive results are indicative of the presence of SARS-CoV-2 RNA. Clinical correlation with patient history and other diagnostic information is  necessary to determine patient infection status. Positive results do not rule out bacterial infection or co-infection with other viruses.  The expected result is Negative. Fact Sheet for Patients: SugarRoll.be Fact Sheet for Healthcare Providers: https://www.woods-mathews.com/ This test is not yet approved or cleared by the [place] FDA and  has been authorized for detection and/or diagnosis of SARS-CoV-2 by FDA under an Emergency Use Authorization (EUA). This EUA will remain  in effect (meaning this test can be  used) for the duration of the COVID-19 declaration under Section 564(b)(1) of the Act, 21 U.S.C. section 360bbb-3(b)(1), unless the authorization is terminated or revoked sooner. Performed at [facility], [place] [address] [place], [place] [address]   Respiratory Panel by PCR     Status: None   Collection Time: 01/13/19  8:42 AM   Specimen: Nasopharyngeal Swab; Respiratory  Result Value Ref Range Status   Adenovirus NOT DETECTED NOT DETECTED Final   Coronavirus 229E NOT DETECTED NOT DETECTED Final    Comment: (NOTE) The Coronavirus on the Respiratory Panel, DOES NOT test for the novel  Coronavirus (2019 nCoV)    Coronavirus HKU1 NOT DETECTED NOT DETECTED Final   Coronavirus NL63 NOT DETECTED NOT DETECTED Final   Coronavirus OC43 NOT DETECTED NOT DETECTED Final   Metapneumovirus NOT DETECTED NOT DETECTED Final   Rhinovirus / Enterovirus NOT DETECTED  NOT DETECTED Final   Influenza A NOT DETECTED NOT DETECTED Final   Influenza B NOT DETECTED NOT DETECTED Final   Parainfluenza Virus 1 NOT DETECTED NOT DETECTED Final   Parainfluenza Virus 2 NOT DETECTED NOT DETECTED Final   Parainfluenza Virus 3 NOT DETECTED NOT DETECTED Final   Parainfluenza Virus 4 NOT DETECTED NOT DETECTED Final   Respiratory Syncytial Virus NOT DETECTED NOT DETECTED Final   Bordetella pertussis NOT DETECTED NOT  DETECTED Final   Chlamydophila pneumoniae NOT DETECTED NOT DETECTED Final   Mycoplasma pneumoniae NOT DETECTED NOT DETECTED Final    Comment: Performed at [facility], [place] [address], [place], [place] [address]  Culture, blood (routine x 2)     Status: None (Preliminary result)   Collection Time: 01/14/19  9:32 AM   Specimen: BLOOD RIGHT ARM  Result Value Ref Range Status   Specimen Description BLOOD RIGHT ARM  Final   Special Requests   Final    BOTTLES DRAWN AEROBIC ONLY Blood Culture adequate volume   Culture   Final    NO GROWTH < 24 HOURS Performed at [facility], [place] [address] [place], [place] [address]    Report Status PENDING  Incomplete  Culture, blood (routine x 2)     Status: None (Preliminary result)   Collection Time: 01/14/19  9:35 AM   Specimen: BLOOD  Result Value Ref Range Status   Specimen Description BLOOD RIGHT ANTECUBITAL  Final   Special Requests   Final    BOTTLES DRAWN AEROBIC ONLY Blood Culture adequate volume   Culture   Final    NO GROWTH < 24 HOURS Performed at [facility], [place] [address], [place], [place] [address]    Report Status PENDING  Incomplete      Studies: No results found.  Scheduled Meds: . aspirin EC  81 mg Oral Daily  . dexamethasone  6 mg Oral Q24H  . enoxaparin (LOVENOX) injection  40 mg Subcutaneous Q24H  . finasteride  5 mg Oral Daily  . Ipratropium-Albuterol  1 puff Inhalation Q6H  . pantoprazole  40 mg Oral Daily  . rosuvastatin  20 mg Oral QPM  . tamsulosin  0.4 mg  Oral QPM  . vitamin C  500 mg Oral Daily  . zinc sulfate  220 mg Oral Daily    Continuous Infusions: . remdesivir 100 mg in NS 100 mL 100 mg (01/15/19 1030)     LOS: 2 days     Alma Friendly, MD [facility]  If 7PM-7AM, please contact night-coverage [email or web address] 01/15/2019, 6:50 PM

## 2019-01-15 NOTE — Telephone Encounter (Signed)
Noted  

## 2019-01-15 NOTE — TOC Progression Note (Signed)
Transition of Care [facility]) - Progression Note    Patient Details  Name: Vincent [name] MRN: [id number] Date of Birth: Jun 22, 1945  Transition of Care [facility]) CM/SW Contact  Purcell Mouton, RN Phone Number: 01/15/2019, 3:50 PM  Clinical Narrative:    Pt home with wife. Will follow for [facility] needs.         Expected Discharge Plan and Services                                                 Social Determinants of Health (SDOH) Interventions    Readmission Risk Interventions No flowsheet data found.

## 2019-01-15 NOTE — Plan of Care (Signed)
  Problem: Education: Goal: Knowledge of General Education information will improve Description: Including pain rating scale, medication(s)/side effects and non-pharmacologic comfort measures Outcome: Progressing   Problem: Health Behavior/Discharge Planning: Goal: Ability to manage health-related needs will improve Outcome: Progressing   Problem: Clinical Measurements: Goal: Ability to maintain clinical measurements within normal limits will improve Outcome: Progressing Goal: Will remain free from infection Outcome: Progressing Goal: Diagnostic test results will improve Outcome: Progressing Goal: Respiratory complications will improve Outcome: Progressing Goal: Cardiovascular complication will be avoided Outcome: Progressing   Problem: Activity: Goal: Risk for activity intolerance will decrease Outcome: Progressing   Problem: Nutrition: Goal: Adequate nutrition will be maintained Outcome: Progressing   Problem: Coping: Goal: Level of anxiety will decrease Outcome: Progressing   Problem: Elimination: Goal: Will not experience complications related to bowel motility Outcome: Progressing   Problem: Safety: Goal: Ability to remain free from injury will improve Outcome: Progressing   Problem: Education: Goal: Knowledge of risk factors and measures for prevention of condition will improve Outcome: Progressing   Problem: Coping: Goal: Psychosocial and spiritual needs will be supported Outcome: Progressing   Problem: Respiratory: Goal: Will maintain a patent airway Outcome: Progressing Goal: Complications related to the disease process, condition or treatment will be avoided or minimized Outcome: Progressing

## 2019-01-15 NOTE — Telephone Encounter (Signed)
Spouse spoke with [facility] on 01/12/19 at 10:23pm stating that the patient was diagnosed with pneumonia.  States patient is shaking and feels like he is having chills.  Patient was advised to go to the ED.

## 2019-01-16 ENCOUNTER — Other Ambulatory Visit: Payer: Self-pay

## 2019-01-16 LAB — CBC WITH DIFFERENTIAL/PLATELET
Abs Immature Granulocytes: 0.28 10*3/uL — ABNORMAL HIGH (ref 0.00–0.07)
Basophils Absolute: 0 10*3/uL (ref 0.0–0.1)
Basophils Relative: 0 %
Eosinophils Absolute: 0 10*3/uL (ref 0.0–0.5)
Eosinophils Relative: 0 %
HCT: 40.6 % (ref 39.0–52.0)
Hemoglobin: 12.4 g/dL — ABNORMAL LOW (ref 13.0–17.0)
Immature Granulocytes: 2 %
Lymphocytes Relative: 8 %
Lymphs Abs: 0.9 10*3/uL (ref 0.7–4.0)
MCH: 26.7 pg (ref 26.0–34.0)
MCHC: 30.5 g/dL (ref 30.0–36.0)
MCV: 87.5 fL (ref 80.0–100.0)
Monocytes Absolute: 0.7 10*3/uL (ref 0.1–1.0)
Monocytes Relative: 6 %
Neutro Abs: 9.9 10*3/uL — ABNORMAL HIGH (ref 1.7–7.7)
Neutrophils Relative %: 84 %
Platelets: 332 10*3/uL (ref 150–400)
RBC: 4.64 MIL/uL (ref 4.22–5.81)
RDW: 13.2 % (ref 11.5–15.5)
WBC: 11.9 10*3/uL — ABNORMAL HIGH (ref 4.0–10.5)
nRBC: 0 % (ref 0.0–0.2)

## 2019-01-16 LAB — COMPREHENSIVE METABOLIC PANEL
ALT: 120 U/L — ABNORMAL HIGH (ref 0–44)
AST: 108 U/L — ABNORMAL HIGH (ref 15–41)
Albumin: 2.9 g/dL — ABNORMAL LOW (ref 3.5–5.0)
Alkaline Phosphatase: 50 U/L (ref 38–126)
Anion gap: 8 (ref 5–15)
BUN: 25 mg/dL — ABNORMAL HIGH (ref 8–23)
CO2: 22 mmol/L (ref 22–32)
Calcium: 8.9 mg/dL (ref 8.9–10.3)
Chloride: 104 mmol/L (ref 98–111)
Creatinine, Ser: 0.99 mg/dL (ref 0.61–1.24)
GFR calc Af Amer: >60 mL/min (ref >60)
GFR calc non Af Amer: >60 mL/min (ref >60)
Glucose, Bld: 202 mg/dL — ABNORMAL HIGH (ref 70–99)
Potassium: 4.7 mmol/L (ref 3.5–5.1)
Sodium: 134 mmol/L — ABNORMAL LOW (ref 135–145)
Total Bilirubin: 1 mg/dL (ref 0.3–1.2)
Total Protein: 6.7 g/dL (ref 6.5–8.1)

## 2019-01-16 LAB — D-DIMER, QUANTITATIVE: D-Dimer, Quant: 1.43 ug/mL-FEU — ABNORMAL HIGH (ref 0.00–0.50)

## 2019-01-16 LAB — FERRITIN: Ferritin: 929 ng/mL — ABNORMAL HIGH (ref 24–336)

## 2019-01-16 LAB — PHOSPHORUS: Phosphorus: 2.4 mg/dL — ABNORMAL LOW (ref 2.5–4.6)

## 2019-01-16 LAB — MAGNESIUM: Magnesium: 2.4 mg/dL (ref 1.7–2.4)

## 2019-01-16 LAB — C-REACTIVE PROTEIN: CRP: 1.6 mg/dL — ABNORMAL HIGH (ref <1.0)

## 2019-01-16 LAB — LACTIC ACID, PLASMA: Lactic Acid, Venous: 2.6 mmol/L (ref 0.5–1.9)

## 2019-01-16 NOTE — Evaluation (Signed)
Physical Therapy One Time Evaluation Patient Details Name: Vincent [name] MRN: [id number] DOB: March 04, 1945 Today's Date: 01/16/2019   History of Present Illness  73 y.o. 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 All education has been completed and the patient has no further questions.  Pt ambulated in room good distance and Spo2 92% on room air upon returning to bed.  Pt with visual impairment so only provided HHA for guidance. See below for any follow-up Physical Therapy or equipment needs. PT is signing off. Thank you for this referral.     Follow Up Recommendations No PT follow up    Equipment Recommendations  None recommended by PT    Recommendations for Other Services       Precautions / Restrictions Precautions Precautions: Fall Precaution Comments: blind Restrictions Weight Bearing Restrictions: No      Mobility  Bed Mobility Overal bed mobility: Modified Independent                Transfers Overall transfer level: Needs assistance Equipment used: None Transfers: Sit to/from Stand Sit to Stand: Min guard;Supervision            Ambulation/Gait Ambulation/Gait assistance: Min Gaffer (Feet): 160 Feet Assistive device: 1 person hand held assist Gait Pattern/deviations: WFL(Within Functional Limits)     General Gait Details: provided HHA due to blindness, ambulated around room and into bathroom, SPo2 92% on room air and HR 130 bpm upon returning to supine Investment banker, corporate notified)  Stairs            Wheelchair Mobility    Modified Rankin (Stroke Patients Only)       Balance Overall balance assessment: Needs assistance         Standing balance support: No upper  extremity supported Standing balance-Leahy Scale: Good                               Pertinent Vitals/Pain Pain Assessment: No/denies pain    Home Living Family/patient expects to be discharged to:: Private residence Living Arrangements: Spouse/significant other   Type of Home: House       Home Layout: One level Home Equipment: Cane - single point      Prior Function Level of Independence: Independent with assistive device(s)         Comments: cane in community     Hand Dominance        Extremity/Trunk Assessment   Upper Extremity Assessment Upper Extremity Assessment: Overall WFL for tasks assessed    Lower Extremity Assessment Lower Extremity Assessment: Overall WFL for tasks assessed    Cervical / Trunk Assessment Cervical / Trunk Assessment: Normal  Communication   Communication: No difficulties  Cognition Arousal/Alertness: Awake/alert Behavior During Therapy: WFL for tasks assessed/performed Overall Cognitive Status: Within Functional Limits for tasks assessed                                        General Comments      Exercises     Assessment/Plan    PT Assessment Patent does not need any further PT services  PT Problem List  PT Treatment Interventions      PT Goals (Current goals can be found in the Care Plan section)  Acute Rehab PT Goals PT Goal Formulation: All assessment and education complete, DC therapy    Frequency     Barriers to discharge        Co-evaluation               AM-PAC PT "6 Clicks" Mobility  Outcome Measure Help needed turning from your back to your side while in a flat bed without using bedrails?: None Help needed moving from lying on your back to sitting on the side of a flat bed without using bedrails?: None Help needed moving to and from a bed to a chair (including a wheelchair)?: A Little Help needed standing up from a chair using your arms (e.g., wheelchair  or bedside chair)?: A Little Help needed to walk in hospital room?: A Little   6 Click Score: 17    End of Session   Activity Tolerance: Patient tolerated treatment well Patient left: with call bell/phone within reach;in bed;with bed alarm set Nurse Communication: Mobility status PT Visit Diagnosis: Difficulty in walking, not elsewhere classified (R26.2)    Time: [id number] PT Time Calculation (min) (ACUTE ONLY): 21 min   Charges:   PT Evaluation $PT Eval Low Complexity: 1 Low         Kati PT, DPT Acute Rehabilitation Services Office: [phone]  Trena Platt 01/16/2019, 12:54 PM

## 2019-01-16 NOTE — Plan of Care (Signed)
  Problem: Education: Goal: Knowledge of General Education information will improve Description: Including pain rating scale, medication(s)/side effects and non-pharmacologic comfort measures Outcome: Progressing   Problem: Health Behavior/Discharge Planning: Goal: Ability to manage health-related needs will improve Outcome: Progressing   Problem: Clinical Measurements: Goal: Ability to maintain clinical measurements within normal limits will improve Outcome: Progressing Goal: Will remain free from infection Outcome: Progressing Goal: Diagnostic test results will improve Outcome: Progressing Goal: Respiratory complications will improve Outcome: Progressing Goal: Cardiovascular complication will be avoided Outcome: Progressing   Problem: Activity: Goal: Risk for activity intolerance will decrease Outcome: Progressing   Problem: Nutrition: Goal: Adequate nutrition will be maintained Outcome: Progressing   Problem: Coping: Goal: Level of anxiety will decrease Outcome: Progressing   Problem: Elimination: Goal: Will not experience complications related to bowel motility Outcome: Progressing   Problem: Safety: Goal: Ability to remain free from injury will improve Outcome: Progressing   Problem: Education: Goal: Knowledge of risk factors and measures for prevention of condition will improve Outcome: Progressing   Problem: Coping: Goal: Psychosocial and spiritual needs will be supported Outcome: Progressing   Problem: Respiratory: Goal: Will maintain a patent airway Outcome: Progressing Goal: Complications related to the disease process, condition or treatment will be avoided or minimized Outcome: Progressing

## 2019-01-16 NOTE — Care Management Important Message (Signed)
Important Message  Patient Details IM Letter given to Gabriel Earing RN Case Manager to present to the Patient Name: Vincent [name] MRN: [id number] Date of Birth: 05-20-1945   Medicare Important Message Given:  Yes     Kerin Salen 01/16/2019, 10:47 AM

## 2019-01-16 NOTE — Progress Notes (Signed)
PROGRESS NOTE  Vincent [name] [id number] DOB: 1945-10-31 DOA: 01/12/2019 PCP: Vincent Borg, MD  HPI/Recap of past 24 hours: HPI from Vincent Vincent [name] is a 73 y.o. 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 Pt reports he had seen his PCP, Vincent. [name], in the interval and was prescribed Levaquin, which he took the first dose a day before coming to the ED. Pt found to be positive for covid 19 virus. Patient admitted for further management.    Today, pt seen and examined, denies any new complaints   Assessment/Plan: Active Problems:   Viral pneumonia   Pneumonia due to COVID-19 virus   Pneumonia 2/2 COVID-19 virus Currently afebrile, with leukocytosis (steroid) Currently not requiring any supplemental oxygen Inflammatory markers elevated, will trend Procalcitonin negative Lactic acid elevated at 2.8, s/p IVF D-dimer elevated BC x2, with 1/4 growing coagulase-negative, likely contaminant, repeat BC x2 pending CTA chest showed no PE, but some lower lobe detail is degraded by motion artifact, bilateral pulmonary opacity suggestive of COVID-19 pneumonia Continue remdesivir, Decadron Continue supplemental oxygen as needed, cough suppressants, inhalers, vitamins  Mild transaminitis Likely 2/2 covid infection Continue to trend  Diarrhea Resolved Likely due to above  Elevated BP readings Not on any home BP meds Start amlodipine, hydralazine PO prn  GERD Continue PPI  Hyperlipidemia Continue statins  BPH Continue tamsulosin, finasteride       Malnutrition Type:      Malnutrition Characteristics:      Nutrition Interventions:       Estimated body mass index is 24.74 kg/m as calculated from the following:   Height as of this encounter: 5\' 9"  (1.753 m).   Weight as of this  encounter: 76 kg.     Code Status: Full  Family Communication: None at bedside  Disposition Plan: To be determined   Consultants:  None  Procedures:  None  Antimicrobials:  None  DVT prophylaxis: Lovenox   Objective: Vitals:   01/15/19 1954 01/16/19 1214 01/16/19 2024 01/16/19 2108  BP: (!) 152/88 (!) 135/96 128/86 (!) 148/90  Pulse: 65 91 92 (!) 104  Resp:  16 20   Temp: 98 F (36.7 C) 97.7 F (36.5 C) 97.9 F (36.6 C) 97.6 F (36.4 C)  TempSrc: Oral Oral Oral Oral  SpO2: 98% 96% 97% 97%  Weight:      Height:        Intake/Output Summary (Last 24 hours) at 01/16/2019 2128 Last data filed at 01/16/2019 1800 Gross per 24 hour  Intake 700 ml  Output 450 ml  Net 250 ml   Filed Weights   01/13/19 1856  Weight: 76 kg    Exam:  General: NAD, blind in both eyes  Cardiovascular: S1, S2 present  Respiratory: CTAB  Abdomen: Soft, nontender, nondistended, bowel sounds present  Musculoskeletal: No bilateral pedal edema noted  Skin: Normal  Psychiatry: Normal mood    Data Reviewed: CBC: Recent Labs  Lab 01/11/19 0955 01/13/19 0056 01/14/19 0254 01/15/19 0259 01/16/19 0329  WBC 5.3 6.9 6.9 9.0 11.9*  NEUTROABS 3.7 5.5 5.8 7.7 9.9*  HGB 13.6 13.1 12.6* 11.8* 12.4*  HCT 42.2 41.9 40.8 37.9* 40.6  MCV 85.2 86.9 88.3 86.3 87.5  PLT 236.0 268 288 323 [id number]   Basic Metabolic Panel: Recent Labs  Lab 01/11/19 0955 01/13/19 0056 01/14/19 0254 01/15/19 0259 01/16/19 0329  NA  132* 135 137 135 134*  K 4.3 4.3 4.9 4.4 4.7  CL 97 98 103 104 104  CO2 23 23 23  21* 22  GLUCOSE 151* 132* 156* 198* 202*  BUN 20 21 21  25* 25*  CREATININE 1.36 1.19 1.13 0.97 0.99  CALCIUM 9.4 9.1 9.1 9.0 8.9  MG  --   --  2.4 2.3 2.4  PHOS  --   --  2.3* 2.2* 2.4*   GFR: Estimated Creatinine Clearance: 67.4 mL/min (by C-G formula based on SCr of 0.99 mg/dL). Liver Function Tests: Recent Labs  Lab 01/11/19 0955 01/13/19 0056 01/14/19 0254 01/15/19 0259  01/16/19 0329  AST 53* 84* 90* 85* 108*  ALT 37 61* 70* 80* 120*  ALKPHOS 60 55 52 48 50  BILITOT 0.6 0.8 1.1 0.6 1.0  PROT 8.0 7.7 7.3 6.7 6.7  ALBUMIN 3.9 3.3* 3.2* 2.7* 2.9*   Recent Labs  Lab 01/11/19 0955  LIPASE 23.0   No results for input(s): AMMONIA in the last 168 hours. Coagulation Profile: No results for input(s): INR, PROTIME in the last 168 hours. Cardiac Enzymes: No results for input(s): CKTOTAL, CKMB, CKMBINDEX, TROPONINI in the last 168 hours. BNP (last 3 results) No results for input(s): PROBNP in the last 8760 hours. HbA1C: No results for input(s): HGBA1C in the last 72 hours. CBG: No results for input(s): GLUCAP in the last 168 hours. Lipid Profile: No results for input(s): CHOL, HDL, LDLCALC, TRIG, CHOLHDL, LDLDIRECT in the last 72 hours. Thyroid Function Tests: No results for input(s): TSH, T4TOTAL, FREET4, T3FREE, THYROIDAB in the last 72 hours. Anemia Panel: Recent Labs    01/15/19 0259 01/16/19 0329  FERRITIN 1,109* 929*   Urine analysis:    Component Value Date/Time   COLORURINE YELLOW 01/13/2019 0056   APPEARANCEUR CLEAR 01/13/2019 0056   LABSPEC >1.046 (H) 01/13/2019 0056   PHURINE 5.0 01/13/2019 0056   GLUCOSEU NEGATIVE 01/13/2019 0056   GLUCOSEU NEGATIVE 01/11/2019 0955   HGBUR NEGATIVE 01/13/2019 0056   BILIRUBINUR NEGATIVE 01/13/2019 0056   BILIRUBINUR negative 05/20/2016 0911   KETONESUR NEGATIVE 01/13/2019 0056   PROTEINUR 30 (A) 01/13/2019 0056   UROBILINOGEN 1.0 01/11/2019 0955   NITRITE NEGATIVE 01/13/2019 0056   LEUKOCYTESUR NEGATIVE 01/13/2019 0056   Sepsis Labs: @LABRCNTIP (procalcitonin:4,lacticidven:4)  ) Recent Results (from the past 240 hour(s))  Urine culture     Status: None   Collection Time: 01/11/19  9:55 AM   Specimen: Blood  Result Value Ref Range Status   MICRO NUMBER: [id number]  Final   SPECIMEN QUALITY: Adequate  Final   Sample Source NOT GIVEN  Final   STATUS: FINAL  Final   ISOLATE 1:   Final     Growth of mixed flora was isolated, suggesting probable contamination. No further testing will be performed. If clinically indicated, recollection using a method to minimize contamination, with prompt transfer to Urine Culture Transport Tube, is  recommended.   Blood Culture (routine x 2)     Status: None (Preliminary result)   Collection Time: 01/13/19 12:56 AM   Specimen: BLOOD  Result Value Ref Range Status   Specimen Description   Final    BLOOD RIGHT ANTECUBITAL Performed at [facility] [address] [place], [place] [address]    Special Requests   Final    BOTTLES DRAWN AEROBIC AND ANAEROBIC Blood Culture adequate volume Performed at [facility] [address], [place], [place] [address]    Culture   Final  NO GROWTH 3 DAYS Performed at [facility], [place] [address] [place], [place] [address]    Report Status PENDING  Incomplete  Blood Culture (routine x 2)     Status: Abnormal   Collection Time: 01/13/19 12:56 AM   Specimen: BLOOD  Result Value Ref Range Status   Specimen Description   Final    BLOOD LEFT ANTECUBITAL Performed at [facility] [address] [place], [place] [address]    Special Requests   Final    BOTTLES DRAWN AEROBIC AND ANAEROBIC Blood Culture adequate volume Performed at [facility] [address], [place], [place] [address]    Culture  Setup Time   Final    GRAM POSITIVE COCCI ANAEROBIC BOTTLE ONLY CRITICAL RESULT CALLED TO, READ BACK BY AND VERIFIED WITH: PHARMD POINDEXTER, L. 0703 [id number] FCP    Culture (A)  Final    STAPHYLOCOCCUS SPECIES (COAGULASE NEGATIVE) THE SIGNIFICANCE OF ISOLATING THIS ORGANISM FROM A SINGLE SET OF BLOOD CULTURES WHEN MULTIPLE SETS ARE DRAWN IS UNCERTAIN. PLEASE NOTIFY THE MICROBIOLOGY DEPARTMENT WITHIN ONE WEEK IF SPECIATION AND SENSITIVITIES ARE REQUIRED. Performed at [facility], [place] [address] [place], [place]  [address]    Report Status 01/15/2019 FINAL  Final  SARS CORONAVIRUS 2 (TAT 6-24 HRS) Nasopharyngeal Nasopharyngeal Swab     Status: Abnormal   Collection Time: 01/13/19  4:59 AM   Specimen: Nasopharyngeal Swab  Result Value Ref Range Status   SARS Coronavirus 2 POSITIVE (A) NEGATIVE Final    Comment: RESULT CALLED TO, READ BACK BY AND VERIFIED WITH: TIM SMITH [email or web address].@[email or web address]  ON 12.12.2020 BY TCALDWELL MT. (NOTE) SARS-CoV-2 target nucleic acids are DETECTED. The SARS-CoV-2 RNA is generally detectable in upper and lower respiratory specimens during the acute phase of infection. Positive results are indicative of the presence of SARS-CoV-2 RNA. Clinical correlation with patient history and other diagnostic information is  necessary to determine patient infection status. Positive results do not rule out bacterial infection or co-infection with other viruses.  The expected result is Negative. Fact Sheet for Patients: SugarRoll.be Fact Sheet for Healthcare Providers: https://www.woods-mathews.com/ This test is not yet approved or cleared by the [place] FDA and  has been authorized for detection and/or diagnosis of SARS-CoV-2 by FDA under an Emergency Use Authorization (EUA). This EUA will remain  in effect (meaning this test can be  used) for the duration of the COVID-19 declaration under Section 564(b)(1) of the Act, 21 U.S.C. section 360bbb-3(b)(1), unless the authorization is terminated or revoked sooner. Performed at [facility], [place] [address], [place], [place] [address]   Respiratory Panel by PCR     Status: None   Collection Time: 01/13/19  8:42 AM   Specimen: Nasopharyngeal Swab; Respiratory  Result Value Ref Range Status   Adenovirus NOT DETECTED NOT DETECTED Final   Coronavirus 229E NOT DETECTED NOT DETECTED Final    Comment: (NOTE) The Coronavirus on the Respiratory Panel, DOES NOT test for the novel  Coronavirus (2019 nCoV)      Coronavirus HKU1 NOT DETECTED NOT DETECTED Final   Coronavirus NL63 NOT DETECTED NOT DETECTED Final   Coronavirus OC43 NOT DETECTED NOT DETECTED Final   Metapneumovirus NOT DETECTED NOT DETECTED Final   Rhinovirus / Enterovirus NOT DETECTED NOT DETECTED Final   Influenza A NOT DETECTED NOT DETECTED Final   Influenza B NOT DETECTED NOT DETECTED Final   Parainfluenza Virus 1 NOT DETECTED NOT DETECTED Final   Parainfluenza Virus  2 NOT DETECTED NOT DETECTED Final   Parainfluenza Virus 3 NOT DETECTED NOT DETECTED Final   Parainfluenza Virus 4 NOT DETECTED NOT DETECTED Final   Respiratory Syncytial Virus NOT DETECTED NOT DETECTED Final   Bordetella pertussis NOT DETECTED NOT DETECTED Final   Chlamydophila pneumoniae NOT DETECTED NOT DETECTED Final   Mycoplasma pneumoniae NOT DETECTED NOT DETECTED Final    Comment: Performed at [facility], [place] [address], [place], [place] [address]  Culture, blood (routine x 2)     Status: None (Preliminary result)   Collection Time: 01/14/19  9:32 AM   Specimen: BLOOD RIGHT ARM  Result Value Ref Range Status   Specimen Description BLOOD RIGHT ARM  Final   Special Requests   Final    BOTTLES DRAWN AEROBIC ONLY Blood Culture adequate volume   Culture   Final    NO GROWTH 2 DAYS Performed at [facility], [address], [place], [place] [address]    Report Status PENDING  Incomplete  Culture, blood (routine x 2)     Status: None (Preliminary result)   Collection Time: 01/14/19  9:35 AM   Specimen: BLOOD  Result Value Ref Range Status   Specimen Description BLOOD RIGHT ANTECUBITAL  Final   Special Requests   Final    BOTTLES DRAWN AEROBIC ONLY Blood Culture adequate volume   Culture   Final    NO GROWTH 2 DAYS Performed at [facility], [place] [address], [place], [place] [address]    Report Status PENDING  Incomplete      Studies: No results found.  Scheduled Meds: . amLODipine  5 mg Oral Daily  . aspirin EC  81 mg Oral  Daily  . dexamethasone  6 mg Oral Q24H  . enoxaparin (LOVENOX) injection  40 mg Subcutaneous Q24H  . finasteride  5 mg Oral Daily  . Ipratropium-Albuterol  1 puff Inhalation Q6H  . pantoprazole  40 mg Oral Daily  . rosuvastatin  20 mg Oral QPM  . tamsulosin  0.4 mg Oral QPM  . vitamin C  500 mg Oral Daily  . zinc sulfate  220 mg Oral Daily    Continuous Infusions: . remdesivir 100 mg in NS 100 mL Stopped (01/16/19 0950)     LOS: 3 days     Alma Friendly, MD [facility]  If 7PM-7AM, please contact night-coverage [email or web address] 01/16/2019, 9:28 PM

## 2019-01-17 DIAGNOSIS — J1289 Other viral pneumonia: Secondary | ICD-10-CM

## 2019-01-17 DIAGNOSIS — U071 COVID-19: Principal | ICD-10-CM

## 2019-01-17 LAB — PHOSPHORUS: Phosphorus: 2.3 mg/dL — ABNORMAL LOW (ref 2.5–4.6)

## 2019-01-17 LAB — COMPREHENSIVE METABOLIC PANEL
ALT: 132 U/L — ABNORMAL HIGH (ref 0–44)
AST: 108 U/L — ABNORMAL HIGH (ref 15–41)
Albumin: 2.7 g/dL — ABNORMAL LOW (ref 3.5–5.0)
Alkaline Phosphatase: 44 U/L (ref 38–126)
Anion gap: 8 (ref 5–15)
BUN: 26 mg/dL — ABNORMAL HIGH (ref 8–23)
CO2: 22 mmol/L (ref 22–32)
Calcium: 8.7 mg/dL — ABNORMAL LOW (ref 8.9–10.3)
Chloride: 105 mmol/L (ref 98–111)
Creatinine, Ser: 0.95 mg/dL (ref 0.61–1.24)
GFR calc Af Amer: >60 mL/min (ref >60)
GFR calc non Af Amer: >60 mL/min (ref >60)
Glucose, Bld: 224 mg/dL — ABNORMAL HIGH (ref 70–99)
Potassium: 4.6 mmol/L (ref 3.5–5.1)
Sodium: 135 mmol/L (ref 135–145)
Total Bilirubin: 0.8 mg/dL (ref 0.3–1.2)
Total Protein: 6.2 g/dL — ABNORMAL LOW (ref 6.5–8.1)

## 2019-01-17 LAB — CBC WITH DIFFERENTIAL/PLATELET
Abs Immature Granulocytes: 0.35 10*3/uL — ABNORMAL HIGH (ref 0.00–0.07)
Basophils Absolute: 0 10*3/uL (ref 0.0–0.1)
Basophils Relative: 0 %
Eosinophils Absolute: 0 10*3/uL (ref 0.0–0.5)
Eosinophils Relative: 0 %
HCT: 39.6 % (ref 39.0–52.0)
Hemoglobin: 12.2 g/dL — ABNORMAL LOW (ref 13.0–17.0)
Immature Granulocytes: 3 %
Lymphocytes Relative: 8 %
Lymphs Abs: 1.1 10*3/uL (ref 0.7–4.0)
MCH: 27.2 pg (ref 26.0–34.0)
MCHC: 30.8 g/dL (ref 30.0–36.0)
MCV: 88.4 fL (ref 80.0–100.0)
Monocytes Absolute: 1 10*3/uL (ref 0.1–1.0)
Monocytes Relative: 8 %
Neutro Abs: 10.4 10*3/uL — ABNORMAL HIGH (ref 1.7–7.7)
Neutrophils Relative %: 81 %
Platelets: 322 10*3/uL (ref 150–400)
RBC: 4.48 MIL/uL (ref 4.22–5.81)
RDW: 13.2 % (ref 11.5–15.5)
WBC: 12.8 10*3/uL — ABNORMAL HIGH (ref 4.0–10.5)
nRBC: 0 % (ref 0.0–0.2)

## 2019-01-17 LAB — D-DIMER, QUANTITATIVE: D-Dimer, Quant: 1.18 ug/mL-FEU — ABNORMAL HIGH (ref 0.00–0.50)

## 2019-01-17 LAB — FERRITIN: Ferritin: 913 ng/mL — ABNORMAL HIGH (ref 24–336)

## 2019-01-17 LAB — C-REACTIVE PROTEIN: CRP: 0.8 mg/dL (ref <1.0)

## 2019-01-17 LAB — MAGNESIUM: Magnesium: 2.4 mg/dL (ref 1.7–2.4)

## 2019-01-17 MED ORDER — AMLODIPINE BESYLATE 5 MG PO TABS: 5.0000 mg | ORAL_TABLET | Freq: Every day | ORAL | 0 refills | Status: DC

## 2019-01-17 MED ORDER — DEXAMETHASONE 6 MG PO TABS: 6.0000 mg | ORAL_TABLET | ORAL | 0 refills | Status: DC

## 2019-01-17 MED ORDER — ZINC SULFATE 220 (50 ZN) MG PO CAPS: 220.0000 mg | ORAL_CAPSULE | Freq: Every day | ORAL | 0 refills | Status: AC

## 2019-01-17 MED ORDER — ASCORBIC ACID 500 MG PO TABS: 500.0000 mg | ORAL_TABLET | Freq: Every day | ORAL | 0 refills | Status: AC

## 2019-01-17 NOTE — Discharge Instructions (Signed)

## 2019-01-17 NOTE — Discharge Summary (Addendum)
Physician Discharge Summary  Vincent [name] [id number] DOB: November 27, 1945 DOA: 01/12/2019  PCP: Biagio Borg, MD  Admit date: 01/12/2019 Discharge date: 01/17/2019  Admitted From: Home Discharge disposition: Home   Code Status: Full Code  Diet Recommendation: Cardiac diet  Recommendations for Outpatient Follow-Up:   1. 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 Patient presented to the ED on 12/11with one week history of fevers, chills, myalgias. Pt reports he had seen his PCP, Dr. [name], in the interval and was prescribed Levaquin, which he took the first dose a day before coming to the ED.  In the ED, patient was found to be positive for covid 19 virus. Patient admitted for further management.  Hospital Course:   Pneumonia 2/2 COVID-19 virus -Presented with 1 week history of fevers, chills and myalgias. -CTA chest showed no PE, but some lower lobe detail is degraded by motion artifact, bilateral pulmonary opacity suggestive of COVID-19 pneumonia. -Inflammatory markers were elevated, trended down. Procalcitonin level was negative. -Completed the course of remdesivir. -Currently on oral dexamethasone 6 mg daily, 5 more days to complete. -Hemodynamically stable. Afebrile. WBC count elevated because of steroids. -Not on supplemental oxygen. -Stable for discharge to home today. -Continue cough suppressants, inhalers, vitamins  Lactic acidosis -Lactate level is elevated to 2.6, for some unknown reason, patient continues to have lactic acid level elevated.  All other blood work related to COVID-19 has resolved. -He is not on any medicine that could lead to type B lactic  acidosis -Patient can follow-up with primary care provider in a week for repeat lactic acid level testing.  Mild transaminitis Likely 2/2 covid infection. Continue to trend  Diarrhea Resolved. Likely due to above  Contaminated blood culture -Coag negative staph aureus during one of the blood cultures drawn in the ED.  Likely contamination.  Repeat blood culture did not show any growth.  Hypertension Not on any home BP meds. Blood pressure was elevated in the hospital.  Improved with amlodipine and hydralazine PO.   GERD Continue PPI  Hyperlipidemia Continue statins  BPH Continue tamsulosin, finasteride  Stable for discharge to home.  Subjective:  Seen and examined this morning.  Patient was walking back from the bathroom.  Visually impaired since childhood.  Not in physical distress.  Not on oxygen supplementation.  Feels ready to go home today.  Discharge Exam:   Vitals:   01/16/19 2024 01/16/19 2108 01/17/19 0530 01/17/19 0753  BP: 128/86 (!) 148/90 (!) 142/92   Pulse: 92 (!) 104 76   Resp: 20  18   Temp: 97.9 F (36.6 C) 97.6 F (36.4 C) 97.7 F (36.5 C)   TempSrc: Oral Oral Oral   SpO2: 97% 97% 96% 95%  Weight:      Height:        Body mass index is 24.74 kg/m.  General exam: Appears calm and comfortable.  Skin: No rashes, lesions or ulcers. HEENT: Atraumatic, normocephalic, supple neck, no obvious bleeding Lungs: Clear to auscultation bilaterally CVS: Regular rate and rhythm, no murmur GI/Abd soft, nontender, nondistended, bowel sound present CNS: Alert, awake, oriented x3 Psychiatry: Mood appropriate Extremities: No pedal edema, no calf tenderness  Discharge Instructions:  Wound care: None Discharge Instructions  Diet general   Complete by: As directed    Increase activity slowly   Complete by: As directed    Temperature monitoring   Complete by: Jan 17, 2019    After how many days would you like to receive a notification of this  patient's flowsheet entries?: 1   MyChart COVID-19 home monitoring program   Complete by: Jan 24, 2019    Is the patient willing to use the [facility] for home monitoring?: Yes     Follow-up Information    Biagio Borg, MD Follow up.   Specialties: Internal Medicine, Radiology Contact information: [place] [address] [phone]          Allergies as of 01/17/2019      Reactions   Chlorhexidine       Medication List    STOP taking these medications   cephALEXin 500 MG capsule Commonly known as: Keflex   ibuprofen 800 MG tablet Commonly known as: ADVIL   levofloxacin 500 MG tablet Commonly known as: Levaquin   meloxicam 7.5 MG tablet Commonly known as: MOBIC   sulfamethoxazole-trimethoprim 800-160 MG tablet Commonly known as: BACTRIM DS     TAKE these medications   amLODipine 5 MG tablet Commonly known as: NORVASC Take 1 tablet (5 mg total) by mouth daily. Start taking on: January 18, 2019   ascorbic acid 500 MG tablet Commonly known as: VITAMIN C Take 1 tablet (500 mg total) by mouth daily for 14 days. Start taking on: January 18, 2019   aspirin 81 MG EC tablet Take 1 tablet (81 mg total) by mouth daily. Swallow whole.   dexamethasone 6 MG tablet Commonly known as: DECADRON Take 1 tablet (6 mg total) by mouth daily for 5 days. Start taking on: January 18, 2019   finasteride 5 MG tablet Commonly known as: PROSCAR TK 1 T PO QHS What changed:   how much to take  how to take this  when to take this  additional instructions   lansoprazole 30 MG capsule Commonly known as: PREVACID TAKE 1 CAPSULE(30 MG) BY MOUTH DAILY What changed:   how much to take  how to take this  when to take this  additional instructions   rosuvastatin 20 MG tablet Commonly known as: CRESTOR Take 20 mg by mouth every evening.   tamsulosin 0.4 MG Caps capsule Commonly known as: FLOMAX Take 0.4 mg by mouth every evening.     Vitamin D (Ergocalciferol) 1.25 MG (50000 UT) Caps capsule Commonly known as: DRISDOL Take 1 capsule (50,000 Units total) by mouth every 7 (seven) days.   zinc sulfate 220 (50 Zn) MG capsule Take 1 capsule (220 mg total) by mouth daily for 14 days. Start taking on: January 18, 2019       Time coordinating discharge: 35 minutes  The results of significant diagnostics from this hospitalization (including imaging, microbiology, ancillary and laboratory) are listed below for reference.    Procedures and Diagnostic Studies:   CT ANGIO CHEST PE W OR WO CONTRAST  Result Date: 01/13/2019 CLINICAL DATA:  73 year old male with suspected acute pneumonia. Started antibiotics yesterday or today. Abnormal D-dimer. EXAM: CT ANGIOGRAPHY CHEST WITH CONTRAST TECHNIQUE: Multidetector CT imaging of the chest was performed using the standard protocol during bolus administration of intravenous contrast. Multiplanar CT image reconstructions and MIPs were obtained to evaluate the vascular anatomy. CONTRAST:  64mL OMNIPAQUE IOHEXOL 350 MG/ML SOLN COMPARISON:  Portable chest 0015 hours today. Chest  radiographs 01/11/2019. FINDINGS: Cardiovascular: Adequate contrast bolus timing in the pulmonary arterial tree. Respiratory motion artifact at the lung bases. No central or hilar pulmonary artery filling defect. The upper lobe pulmonary arteries appear patent. No middle or lower lobe pulmonary embolus identified although some branch detail is degraded by motion. No cardiomegaly or pericardial effusion. Negative visible aorta. Mediastinum/Nodes: Negative. No lymphadenopathy. Lungs/Pleura: Major airways are patent. There is scattered bilateral abnormal pulmonary opacity in the form of peripheral ground-glass and early consolidation scattered in all lobes. Some superimposed dependent atelectasis is possible in the lung bases. No pleural effusion. Upper Abdomen: Negative visible liver, gallbladder, spleen, pancreas, adrenal  glands, kidneys and bowel in the upper abdomen. Musculoskeletal: Negative for age. Review of the MIP images confirms the above findings. IMPRESSION: 1. No pulmonary embolus identified, but some lower lobe branch detail is degraded by motion artifact. 2. Widely scattered bilateral pulmonary opacity in a pattern suggestive of COVID-19 pneumonia. Other viral/atypical infectious etiology is possible. No pleural effusion. Electronically Signed   By: Genevie Ann M.D.   On: 01/13/2019 07:47   DG Chest Port 1 View  Result Date: 01/13/2019 CLINICAL DATA:  Fever and chills EXAM: PORTABLE CHEST 1 VIEW COMPARISON:  01/11/2019 FINDINGS: Cardiac shadows within normal limits. The lungs are well aerated bilaterally. Mild interstitial changes are again identified in the bases predominately similar to that seen on the prior exam. No new focal infiltrate is seen. No sizable effusion is noted. IMPRESSION: Stable bibasilar interstitial markings which may represent some pneumonitis. This is stable from the recent exam. Electronically Signed   By: Inez Catalina M.D.   On: 01/13/2019 00:44     Labs:   Basic Metabolic Panel: Recent Labs  Lab 01/13/19 0056 01/14/19 0254 01/15/19 0259 01/16/19 0329 01/17/19 0326  NA 135 137 135 134* 135  K 4.3 4.9 4.4 4.7 4.6  CL 98 103 104 104 105  CO2 23 23 21* 22 22  GLUCOSE 132* 156* 198* 202* 224*  BUN 21 21 25* 25* 26*  CREATININE 1.19 1.13 0.97 0.99 0.95  CALCIUM 9.1 9.1 9.0 8.9 8.7*  MG  --  2.4 2.3 2.4 2.4  PHOS  --  2.3* 2.2* 2.4* 2.3*   GFR Estimated Creatinine Clearance: 70.3 mL/min (by C-G formula based on SCr of 0.95 mg/dL). Liver Function Tests: Recent Labs  Lab 01/13/19 0056 01/14/19 0254 01/15/19 0259 01/16/19 0329 01/17/19 0326  AST 84* 90* 85* 108* 108*  ALT 61* 70* 80* 120* 132*  ALKPHOS 55 52 48 50 44  BILITOT 0.8 1.1 0.6 1.0 0.8  PROT 7.7 7.3 6.7 6.7 6.2*  ALBUMIN 3.3* 3.2* 2.7* 2.9* 2.7*   Recent Labs  Lab 01/11/19 0955  LIPASE 23.0   No  results for input(s): AMMONIA in the last 168 hours. Coagulation profile No results for input(s): INR, PROTIME in the last 168 hours.  CBC: Recent Labs  Lab 01/13/19 0056 01/14/19 0254 01/15/19 0259 01/16/19 0329 01/17/19 0326  WBC 6.9 6.9 9.0 11.9* 12.8*  NEUTROABS 5.5 5.8 7.7 9.9* 10.4*  HGB 13.1 12.6* 11.8* 12.4* 12.2*  HCT 41.9 40.8 37.9* 40.6 39.6  MCV 86.9 88.3 86.3 87.5 88.4  PLT 268 288 323 332 322   Cardiac Enzymes: No results for input(s): CKTOTAL, CKMB, CKMBINDEX, TROPONINI in the last 168 hours. BNP: Invalid input(s): POCBNP CBG: No results for input(s): GLUCAP in the last 168 hours. D-Dimer Recent Labs    01/16/19 0329 01/17/19 0326  DDIMER 1.43* 1.18*   Hgb  A1c No results for input(s): HGBA1C in the last 72 hours. Lipid Profile No results for input(s): CHOL, HDL, LDLCALC, TRIG, CHOLHDL, LDLDIRECT in the last 72 hours. Thyroid function studies No results for input(s): TSH, T4TOTAL, T3FREE, THYROIDAB in the last 72 hours.  Invalid input(s): FREET3 Anemia work up Recent Labs    01/16/19 0329 01/17/19 0326  FERRITIN 929* 913*   Microbiology Recent Results (from the past 240 hour(s))  Urine culture     Status: None   Collection Time: 01/11/19  9:55 AM   Specimen: Blood  Result Value Ref Range Status   MICRO NUMBER: [id number]  Final   SPECIMEN QUALITY: Adequate  Final   Sample Source NOT GIVEN  Final   STATUS: FINAL  Final   ISOLATE 1:   Final    Growth of mixed flora was isolated, suggesting probable contamination. No further testing will be performed. If clinically indicated, recollection using a method to minimize contamination, with prompt transfer to Urine Culture Transport Tube, is  recommended.   Blood Culture (routine x 2)     Status: None (Preliminary result)   Collection Time: 01/13/19 12:56 AM   Specimen: BLOOD  Result Value Ref Range Status   Specimen Description   Final    BLOOD RIGHT ANTECUBITAL Performed at [facility] [address] [place], [place] [address]    Special Requests   Final    BOTTLES DRAWN AEROBIC AND ANAEROBIC Blood Culture adequate volume Performed at [facility] [address] [place], [place] [address]    Culture   Final    NO GROWTH 4 DAYS Performed at [facility], [place] [address] [place], [place] [address]    Report Status PENDING  Incomplete  Blood Culture (routine x 2)     Status: Abnormal   Collection Time: 01/13/19 12:56 AM   Specimen: BLOOD  Result Value Ref Range Status   Specimen Description   Final    BLOOD LEFT ANTECUBITAL Performed at [facility] [address] [place], [place] [address]    Special Requests   Final    BOTTLES DRAWN AEROBIC AND ANAEROBIC Blood Culture adequate volume Performed at [facility] [address] [place], [place] [address]    Culture  Setup Time   Final    GRAM POSITIVE COCCI ANAEROBIC BOTTLE ONLY CRITICAL RESULT CALLED TO, READ BACK BY AND VERIFIED WITH: PHARMD POINDEXTER, L. 0703 [id number] FCP    Culture (A)  Final    STAPHYLOCOCCUS SPECIES (COAGULASE NEGATIVE) THE SIGNIFICANCE OF ISOLATING THIS ORGANISM FROM A SINGLE SET OF BLOOD CULTURES WHEN MULTIPLE SETS ARE DRAWN IS UNCERTAIN. PLEASE NOTIFY THE MICROBIOLOGY DEPARTMENT WITHIN ONE WEEK IF SPECIATION AND SENSITIVITIES ARE REQUIRED. Performed at [facility], [place] [address] [place], [place] [address]    Report Status 01/15/2019 FINAL  Final  SARS CORONAVIRUS 2 (TAT 6-24 HRS) Nasopharyngeal Nasopharyngeal Swab     Status: Abnormal   Collection Time: 01/13/19  4:59 AM   Specimen: Nasopharyngeal Swab  Result Value Ref Range Status   SARS Coronavirus 2 POSITIVE (A) NEGATIVE Final    Comment: RESULT CALLED TO, READ BACK BY AND VERIFIED WITH: TIM SMITH RN.@1420  ON 12.12.2020 BY TCALDWELL MT. (NOTE) SARS-CoV-2 target nucleic acids are DETECTED. The SARS-CoV-2 RNA is generally detectable in upper and  lower respiratory specimens during the acute phase of infection. Positive results are indicative of the presence of SARS-CoV-2 RNA. Clinical correlation with patient  history and other diagnostic information is  necessary to determine patient infection status. Positive results do not rule out bacterial infection or co-infection with other viruses.  The expected result is Negative. Fact Sheet for Patients: SugarRoll.be Fact Sheet for Healthcare Providers: https://www.woods-mathews.com/ This test is not yet approved or cleared by the [place] FDA and  has been authorized for detection and/or diagnosis of SARS-CoV-2 by FDA under an Emergency Use Authorization (EUA). This EUA will remain  in effect (meaning this test can be  used) for the duration of the COVID-19 declaration under Section 564(b)(1) of the Act, 21 U.S.C. section 360bbb-3(b)(1), unless the authorization is terminated or revoked sooner. Performed at [facility], [place] [address], [place], [place] [address]   Respiratory Panel by PCR     Status: None   Collection Time: 01/13/19  8:42 AM   Specimen: Nasopharyngeal Swab; Respiratory  Result Value Ref Range Status   Adenovirus NOT DETECTED NOT DETECTED Final   Coronavirus 229E NOT DETECTED NOT DETECTED Final    Comment: (NOTE) The Coronavirus on the Respiratory Panel, DOES NOT test for the novel  Coronavirus (2019 nCoV)    Coronavirus HKU1 NOT DETECTED NOT DETECTED Final   Coronavirus NL63 NOT DETECTED NOT DETECTED Final   Coronavirus OC43 NOT DETECTED NOT DETECTED Final   Metapneumovirus NOT DETECTED NOT DETECTED Final   Rhinovirus / Enterovirus NOT DETECTED NOT DETECTED Final   Influenza A NOT DETECTED NOT DETECTED Final   Influenza B NOT DETECTED NOT DETECTED Final   Parainfluenza Virus 1 NOT DETECTED NOT DETECTED Final   Parainfluenza Virus 2 NOT DETECTED NOT DETECTED Final   Parainfluenza Virus 3 NOT DETECTED NOT  DETECTED Final   Parainfluenza Virus 4 NOT DETECTED NOT DETECTED Final   Respiratory Syncytial Virus NOT DETECTED NOT DETECTED Final   Bordetella pertussis NOT DETECTED NOT DETECTED Final   Chlamydophila pneumoniae NOT DETECTED NOT DETECTED Final   Mycoplasma pneumoniae NOT DETECTED NOT DETECTED Final    Comment: Performed at [facility], [place]. [address], [place], [place] [address]  Culture, blood (routine x 2)     Status: None (Preliminary result)   Collection Time: 01/14/19  9:32 AM   Specimen: BLOOD RIGHT ARM  Result Value Ref Range Status   Specimen Description BLOOD RIGHT ARM  Final   Special Requests   Final    BOTTLES DRAWN AEROBIC ONLY Blood Culture adequate volume   Culture   Final    NO GROWTH 3 DAYS Performed at [facility], [address], [place], [place] [address]    Report Status PENDING  Incomplete  Culture, blood (routine x 2)     Status: None (Preliminary result)   Collection Time: 01/14/19  9:35 AM   Specimen: BLOOD  Result Value Ref Range Status   Specimen Description BLOOD RIGHT ANTECUBITAL  Final   Special Requests   Final    BOTTLES DRAWN AEROBIC ONLY Blood Culture adequate volume   Culture   Final    NO GROWTH 3 DAYS Performed at [facility], [place] [address] [place], [place] [address]    Report Status PENDING  Incomplete    Please note: You were cared for by a hospitalist during your hospital stay. Once you are discharged, your primary care physician will handle any further medical issues. Please note that NO REFILLS for any discharge medications will be authorized once you are discharged, as it is imperative that you return to your primary  care physician (or establish a relationship with a primary care physician if you do not have one) for your post hospital discharge needs so that they can reassess your need for medications and monitor your lab values.  Signed: Terrilee Croak  [facility] 01/17/2019, 1:36 PM

## 2019-01-17 NOTE — Plan of Care (Signed)
Discharge instructions reviewed with patient, questions answered, verbalized understanding.  Patient awaiting transportation home.

## 2019-01-18 ENCOUNTER — Telehealth: Payer: Self-pay | Admitting: *Deleted

## 2019-01-18 LAB — CULTURE, BLOOD (ROUTINE X 2)
Culture: NO GROWTH
Special Requests: ADEQUATE

## 2019-01-18 NOTE — Telephone Encounter (Signed)
Called pt concerning hosp d/c from 01/17/19, and appt that's been made for 01/22/19. Pt states he made appt on yesterday. Inform pt since he test positive for Covid that we will change his appt to an virtual or telephone visit. Pt is requesting telephone call since he is not able to do virtual. Change to telephone and  Completed TCM questionnaire below.Johny Chess  Transition Care Management Follow-up Telephone Call   Date discharged? 01/17/19   How have you been since you were released from the hospital? Pt states he is doing alright. Just trying to get his strength back   Do you understand why you were in the hospital? YES   Do you understand the discharge instructions? YES   Where were you discharged to? HOME   Items Reviewed:  Medications reviewed: YES, He states there was no changes to his medications  Allergies reviewed: YES  Dietary changes reviewed: YES, heart healthy  Referrals reviewed: No referral recommeded   Functional Questionnaire:   Activities of Daily Living (ADLs):   He states he are independent in the following: ambulation, bathing and hygiene, feeding, continence, grooming, toileting and dressing States he doesn't require assistance    Any transportation issues/concerns?: NO   Any patient concerns? NO   Confirmed importance and date/time of follow-up visits scheduled YES, (Telephone) 01/22/19  Provider Appointment booked with Dr. Jenny Reichmann  Confirmed with patient if condition begins to worsen call PCP or go to the ER.  Patient was given the office number and encouraged to call back with question or concerns.  : YES

## 2019-01-19 LAB — CULTURE, BLOOD (ROUTINE X 2)
Culture: NO GROWTH
Culture: NO GROWTH
Special Requests: ADEQUATE
Special Requests: ADEQUATE

## 2019-01-22 ENCOUNTER — Encounter: Payer: Self-pay | Admitting: Internal Medicine

## 2019-01-22 ENCOUNTER — Ambulatory Visit (INDEPENDENT_AMBULATORY_CARE_PROVIDER_SITE_OTHER): Payer: Medicare Other | Admitting: Internal Medicine

## 2019-01-22 DIAGNOSIS — J1282 Pneumonia due to coronavirus disease 2019: Secondary | ICD-10-CM

## 2019-01-22 DIAGNOSIS — J1289 Other viral pneumonia: Secondary | ICD-10-CM | POA: Diagnosis not present

## 2019-01-22 DIAGNOSIS — U071 COVID-19: Secondary | ICD-10-CM

## 2019-01-22 NOTE — Progress Notes (Signed)
Patient ID: Vincent [name], male   DOB: May 10, 1945, 73 y.o.   MRN: [id number]  Phone Visit  Cumulative time during 7-day interval 12 min, there was not an associated office visit for this concern within a 7 day period.  Verbal consent for services obtained from patient prior to services given.  Names of all persons present for services: [name], MD, patient  Chief complaint: s/p COVID infection  History, background, results pertinent: Here to f/u; overall doing ok,  Pt denies chest pain, increasing sob or doe, wheezing, orthopnea, PND, increased LE swelling, palpitations, dizziness or syncope.  Pt denies new neurological symptoms such as new headache, or facial or extremity weakness or numbness.  Pt denies polydipsia, polyuria, or low sugar episode.  Pt states overall good compliance with meds, mostly trying to follow appropriate diet, with wt overall stable,  but little exercise however.   Pt denies fever, wt loss, night sweats, loss of appetite, or other constitutional symptoms  Denies urinary symptoms such as dysuria, frequency, urgency, flank pain, hematuria or n/v, fever, chills.     Past Medical History:  Diagnosis Date  . Allergic rhinitis, cause unspecified 02/08/2011  . Anemia, unspecified 04/11/2011  . Arthritis    knee  . 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 BPH (benign prostatic hypertrophy) 02/08/2011  . Cataract    as a child, blind since 4 or 69 yrs old  . 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 Diabetes mellitus type 2, diet-controlled ([place])   . Diverticulosis   . Elevated PSA 02/08/2011  . GERD (gastroesophageal reflux disease) 02/08/2011  . 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/2011   pt unaware  . History of colon polyps   . History of kidney stones   . History of prostatitis 04/11/2011  . Hyperlipidemia   . Lumbar disc disease 02/06/2011  . Vitamin D deficiency   . Wears partial dentures    upper and lower   No results found  for this or any previous visit (from the past 48 hour(s)).  Lab Results  Component Value Date   WBC 12.8 (H) 01/17/2019   HGB 12.2 (L) 01/17/2019   HCT 39.6 01/17/2019   PLT 322 01/17/2019   GLUCOSE 224 (H) 01/17/2019   CHOL 176 07/11/2018   TRIG 102 01/13/2019   HDL 51.30 07/11/2018   LDLCALC 110 (H) 07/11/2018   ALT 132 (H) 01/17/2019   AST 108 (H) 01/17/2019   NA 135 01/17/2019   K 4.6 01/17/2019   CL 105 01/17/2019   CREATININE 0.95 01/17/2019   BUN 26 (H) 01/17/2019   CO2 22 01/17/2019   TSH 1.31 01/11/2019   PSA 7.90 (H) 10/28/2017   HGBA1C 7.1 (H) 07/11/2018   MICROALBUR <0.7 07/11/2018    A/P/next steps:   1)  covid pna - improved, cont to follow  2)  DM - stable for f/u lab  3)  Elevated PSA - stable for f/u lab  4)  Abnormal LFTs - stable for f/u lab  [name] MD

## 2019-01-31 ENCOUNTER — Telehealth: Payer: Self-pay

## 2019-01-31 ENCOUNTER — Telehealth: Payer: Self-pay | Admitting: Internal Medicine

## 2019-01-31 ENCOUNTER — Other Ambulatory Visit: Payer: Self-pay | Admitting: Internal Medicine

## 2019-01-31 DIAGNOSIS — E119 Type 2 diabetes mellitus without complications: Secondary | ICD-10-CM

## 2019-01-31 DIAGNOSIS — J189 Pneumonia, unspecified organism: Secondary | ICD-10-CM

## 2019-01-31 DIAGNOSIS — R972 Elevated prostate specific antigen [PSA]: Secondary | ICD-10-CM

## 2019-01-31 NOTE — Telephone Encounter (Signed)
Faxed and confirmed [facility] [employer] form to [phone]

## 2019-01-31 NOTE — Telephone Encounter (Signed)
Patient has dropped off [employer] forms to be completed.

## 2019-02-01 DIAGNOSIS — Z0279 Encounter for issue of other medical certificate: Secondary | ICD-10-CM

## 2019-02-01 NOTE — Telephone Encounter (Signed)
Forms have been completed &signed for dates 01/01/19 to 02/05/2019. Faxed to [employer] @ [phone], Copy sent to scan &Charged for.   LVM to inform patient, Note on forms saying patient will pick up on 1/6.

## 2019-02-02 ENCOUNTER — Encounter: Payer: Self-pay | Admitting: Internal Medicine

## 2019-02-02 NOTE — Assessment & Plan Note (Signed)
Improved,  to f/u any worsening symptoms or concerns 

## 2019-02-02 NOTE — Patient Instructions (Signed)
Please continue all other medications as before, and refills have been done if requested.  Please have the pharmacy call with any other refills you may need.  Please continue your efforts at being more active, low cholesterol diet, and weight control.  Please keep your appointments with your specialists as you may have planned  Return for labs next wk

## 2019-02-07 ENCOUNTER — Other Ambulatory Visit: Payer: Self-pay

## 2019-02-07 ENCOUNTER — Other Ambulatory Visit: Payer: Self-pay | Admitting: *Deleted

## 2019-02-07 ENCOUNTER — Other Ambulatory Visit (INDEPENDENT_AMBULATORY_CARE_PROVIDER_SITE_OTHER): Payer: Medicare Other

## 2019-02-07 ENCOUNTER — Ambulatory Visit (INDEPENDENT_AMBULATORY_CARE_PROVIDER_SITE_OTHER): Payer: Medicare Other

## 2019-02-07 DIAGNOSIS — J189 Pneumonia, unspecified organism: Secondary | ICD-10-CM

## 2019-02-07 DIAGNOSIS — Z0001 Encounter for general adult medical examination with abnormal findings: Secondary | ICD-10-CM | POA: Diagnosis not present

## 2019-02-07 LAB — LIPID PANEL
Cholesterol: 118 mg/dL (ref 0–200)
HDL: 59.9 mg/dL
LDL Cholesterol: 30 mg/dL (ref 0–99)
NonHDL: 57.87
Total CHOL/HDL Ratio: 2
Triglycerides: 140 mg/dL (ref 0.0–149.0)
VLDL: 28 mg/dL (ref 0.0–40.0)

## 2019-02-07 LAB — BASIC METABOLIC PANEL
BUN: 14 mg/dL (ref 6–23)
CO2: 30 mEq/L (ref 19–32)
Calcium: 9.5 mg/dL (ref 8.4–10.5)
Chloride: 107 mEq/L (ref 96–112)
Creatinine, Ser: 0.91 mg/dL (ref 0.40–1.50)
GFR: 98.81 mL/min (ref >60.00)
Glucose, Bld: 115 mg/dL — ABNORMAL HIGH (ref 70–99)
Potassium: 4.9 mEq/L (ref 3.5–5.1)
Sodium: 141 mEq/L (ref 135–145)

## 2019-02-07 LAB — HEPATIC FUNCTION PANEL
ALT: 34 U/L (ref 0–53)
AST: 21 U/L (ref 0–37)
Albumin: 3.7 g/dL (ref 3.5–5.2)
Alkaline Phosphatase: 126 U/L — ABNORMAL HIGH (ref 39–117)
Bilirubin, Direct: 0.1 mg/dL (ref 0.0–0.3)
Total Bilirubin: 0.3 mg/dL (ref 0.2–1.2)
Total Protein: 6.8 g/dL (ref 6.0–8.3)

## 2019-02-07 LAB — PSA: PSA: 6.94 ng/mL — ABNORMAL HIGH (ref 0.10–4.00)

## 2019-02-07 LAB — HEMOGLOBIN A1C: Hgb A1c MFr Bld: 7.8 % — ABNORMAL HIGH (ref 4.6–6.5)

## 2019-02-08 ENCOUNTER — Other Ambulatory Visit: Payer: Self-pay | Admitting: Internal Medicine

## 2019-02-08 MED ORDER — METFORMIN HCL ER 500 MG PO TB24: 500.0000 mg | ORAL_TABLET | Freq: Every day | ORAL | 3 refills | Status: DC

## 2019-02-16 ENCOUNTER — Telehealth: Payer: Self-pay | Admitting: Internal Medicine

## 2019-02-16 MED ORDER — AMLODIPINE BESYLATE 5 MG PO TABS: 5.0000 mg | ORAL_TABLET | Freq: Every day | ORAL | 1 refills | Status: DC

## 2019-02-16 NOTE — Telephone Encounter (Signed)
Reviewed chart pt is up-to-date sent refills to [facility].../lmb  

## 2019-02-16 NOTE — Telephone Encounter (Signed)
Pt was prescribed  amLODipine (NORVASC) 5 MG tablet While in the hospital.  Pt would like to know if Dr Jenny Reichmann will refill and send to  [facility] [place], [place] Phone:  [phone]  Fax:  [phone]     Dr Jenny Reichmann has never written this Rx for the pt

## 2019-02-21 ENCOUNTER — Telehealth: Payer: Self-pay | Admitting: Internal Medicine

## 2019-02-21 ENCOUNTER — Ambulatory Visit: Payer: Medicare Other

## 2019-02-21 NOTE — Telephone Encounter (Signed)
Done hardcopy 

## 2019-02-21 NOTE — Telephone Encounter (Signed)
Copied from [place] [id number]. Topic: General - Inquiry >> Feb 21, 2019  7:37 AM Vincent [name] wrote: Reason for CRM:   Pt states that he needs a return to work note after having COVID

## 2019-02-23 NOTE — Telephone Encounter (Signed)
Letter faxed to number below. Pt informed.

## 2019-02-23 NOTE — Telephone Encounter (Signed)
  Patient request letter be faxed to employer at [phone] ATTN: Hoover Browns at [employer]

## 2019-03-20 ENCOUNTER — Ambulatory Visit: Payer: Medicare Other | Attending: Internal Medicine

## 2019-03-20 ENCOUNTER — Other Ambulatory Visit: Payer: Self-pay

## 2019-03-20 DIAGNOSIS — Z23 Encounter for immunization: Secondary | ICD-10-CM | POA: Insufficient documentation

## 2019-03-20 NOTE — Progress Notes (Signed)
   Covid-19 Vaccination Clinic  Name:  Cleotha Arriaza    MRN: [id number] DOB: [birth year]  03/20/2019  Mr. Jacobucci was observed post Covid-19 immunization for 15 minutes without incidence. He was provided with Vaccine Information Sheet and instruction to access the V-Safe system.   Mr. Nesler was instructed to call 911 with any severe reactions post vaccine: Marland Kitchen Difficulty breathing  . Swelling of your face and throat  . A fast heartbeat  . A bad rash all over your body  . Dizziness and weakness    Immunizations Administered    Name Date Dose VIS Date Route   Moderna COVID-19 Vaccine 03/20/2019  9:52 AM 0.5 mL 01/02/2019 Intramuscular   Manufacturer: Moderna   Lot: [id number]   [id number]

## 2019-04-17 ENCOUNTER — Ambulatory Visit: Payer: Medicare Other

## 2019-04-18 ENCOUNTER — Ambulatory Visit: Payer: Medicare Other | Attending: Internal Medicine

## 2019-04-18 DIAGNOSIS — Z23 Encounter for immunization: Secondary | ICD-10-CM

## 2019-04-18 NOTE — Progress Notes (Signed)
   Covid-19 Vaccination Clinic  Name:  Vincent [name]    MRN: [id number] DOB: 10-13-1945  04/18/2019  Vincent [name] was observed post Covid-19 immunization for 15 minutes without incident. He was provided with Vaccine Information Sheet and instruction to access the V-Safe system.   Vincent [name] was instructed to call 911 with any severe reactions post vaccine: Marland Kitchen Difficulty breathing  . Swelling of face and throat  . A fast heartbeat  . A bad rash all over body  . Dizziness and weakness   Immunizations Administered    Name Date Dose VIS Date Route   Moderna COVID-19 Vaccine 04/18/2019 10:12 AM 0.5 mL 01/02/2019 Intramuscular   Manufacturer: Moderna   Lot: [id number]   [id number]

## 2019-05-07 ENCOUNTER — Telehealth: Payer: Self-pay

## 2019-05-07 NOTE — Telephone Encounter (Signed)
Rx refill request sent form [facility] for Lansoprazole 30mg  DR capsules.  Last visit was 01/22/2019, no f/u visit currently scheduled.  Please advise if refill is ok.

## 2019-05-08 MED ORDER — LANSOPRAZOLE 30 MG PO CPDR: DELAYED_RELEASE_CAPSULE | ORAL | 2 refills | Status: DC

## 2019-05-08 NOTE — Telephone Encounter (Signed)
Done erx 

## 2019-05-10 ENCOUNTER — Other Ambulatory Visit: Payer: Self-pay

## 2019-05-10 ENCOUNTER — Ambulatory Visit: Payer: Medicare Other | Admitting: Internal Medicine

## 2019-05-10 ENCOUNTER — Encounter: Payer: Self-pay | Admitting: Internal Medicine

## 2019-05-10 VITALS — BP 144/86 | HR 66 | Temp 98.0°F | Ht 69.0 in | Wt 180.6 lb

## 2019-05-10 DIAGNOSIS — R972 Elevated prostate specific antigen [PSA]: Secondary | ICD-10-CM | POA: Diagnosis not present

## 2019-05-10 DIAGNOSIS — E119 Type 2 diabetes mellitus without complications: Secondary | ICD-10-CM | POA: Diagnosis not present

## 2019-05-10 DIAGNOSIS — K219 Gastro-esophageal reflux disease without esophagitis: Secondary | ICD-10-CM | POA: Diagnosis not present

## 2019-05-10 DIAGNOSIS — E785 Hyperlipidemia, unspecified: Secondary | ICD-10-CM

## 2019-05-10 LAB — POCT GLYCOSYLATED HEMOGLOBIN (HGB A1C)
HbA1c POC (<> result, manual entry): 6.7 % (ref 4.0–5.6)
HbA1c, POC (controlled diabetic range): 6.7 % (ref 0.0–7.0)
HbA1c, POC (prediabetic range): 6.7 % — AB (ref 5.7–6.4)
Hemoglobin A1C: 6.7 % — AB (ref 4.0–5.6)

## 2019-05-10 NOTE — Assessment & Plan Note (Signed)
stable overall by history and exam, recent data reviewed with pt, and pt to continue medical treatment as before,  to f/u any worsening symptoms or concerns  

## 2019-05-10 NOTE — Progress Notes (Signed)
Subjective:    Patient ID: Vincent [name], male    DOB: 1946/01/09, 74 y.o.   MRN: [id number]  HPI  Here to f/u; overall doing ok,  Pt denies chest pain, increasing sob or doe, wheezing, orthopnea, PND, increased LE swelling, palpitations, dizziness or syncope.  Pt denies new neurological symptoms such as new headache, or facial or extremity weakness or numbness.  Pt denies polydipsia, polyuria, or low sugar episode.  Pt states overall good compliance with meds, mostly trying to follow appropriate diet, with wt overall stable  Denies urinary symptoms such as dysuria, frequency, urgency, flank pain, hematuria or n/v, fever, chills. Denies worsening reflux, abd pain, dysphagia, n/v, bowel change or blood. Past Medical History:  Diagnosis Date  . Allergic rhinitis, cause unspecified 02/08/2011  . Anemia, unspecified 04/11/2011  . Arthritis    knee  . 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 BPH (benign prostatic hypertrophy) 02/08/2011  . Cataract    as a child, blind since 55 or 64 yrs old  . 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 Diabetes mellitus type 2, diet-controlled ([place])   . Diverticulosis   . Elevated PSA 02/08/2011  . GERD (gastroesophageal reflux disease) 02/08/2011  . 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/2011   pt unaware  . History of colon polyps   . History of kidney stones   . History of prostatitis 04/11/2011  . Hyperlipidemia   . Lumbar disc disease 02/06/2011  . Vitamin D deficiency   . Wears partial dentures    upper and lower   Past Surgical History:  Procedure Laterality Date  . BACK SURGERY     x 3  L4-L5  . COLONOSCOPY  2007   Dr Wynetta Emery  . CYST EXCISION    . THULIUM LASER TURP (TRANSURETHRAL RESECTION OF PROSTATE) N/A 10/27/2018   Procedure: THULIUM LASER TURP (TRANSURETHRAL RESECTION OF PROSTATE);  Surgeon: Festus Aloe, MD;  Location: [facility];  Service: Urology;  Laterality: N/A;  . WISDOM TOOTH  EXTRACTION      reports that he quit smoking about 56 years ago. His smoking use included cigarettes. He has never used smokeless tobacco. He reports that he does not drink alcohol or use drugs. family history includes Blindness in an other family member; Diabetes in an other family member; Heart disease in an other family member; Hypertension in an other family member; Mental illness in an other family member; Sudden death in an other family member. Allergies  Allergen Reactions  . Chlorhexidine    Current Outpatient Medications on File Prior to Visit  Medication Sig Dispense Refill  . aspirin 81 MG EC tablet Take 1 tablet (81 mg total) by mouth daily. Swallow whole. 30 tablet 12  . finasteride (PROSCAR) 5 MG tablet TK 1 T PO QHS (Patient taking differently: Take 5 mg by mouth daily. ) 90 tablet 3  . lansoprazole (PREVACID) 30 MG capsule TAKE 1 CAPSULE(30 MG) BY MOUTH DAILY 90 capsule 2  . metFORMIN (GLUCOPHAGE-XR) 500 MG 24 hr tablet Take 1 tablet (500 mg total) by mouth daily with breakfast. 90 tablet 3  . rosuvastatin (CRESTOR) 20 MG tablet Take 20 mg by mouth every evening.    . tamsulosin (FLOMAX) 0.4 MG CAPS capsule Take 0.4 mg by mouth every evening.   8  . Vitamin D, Ergocalciferol, (DRISDOL) 1.25 MG (50000 UT) CAPS capsule Take 1 capsule (50,000 Units total) by mouth every 7 (seven) days. 12 capsule 0  .  amLODipine (NORVASC) 5 MG tablet Take 1 tablet (5 mg total) by mouth daily. 90 tablet 1   No current facility-administered medications on file prior to visit.   Review of Systems All otherwise neg per pt     Objective:   Physical Exam BP (!) 144/86   Pulse 66   Temp 98 F (36.7 C)   Ht 5\' 9"  (1.753 m)   Wt 180 lb 9.6 oz (81.9 kg)   SpO2 100%   BMI 26.67 kg/m  VS noted,  Constitutional: Pt appears in NAD HENT: Head: NCAT.  Right Ear: External ear normal.  Left Ear: External ear normal.  Eyes: . Pupils are equal, round, and reactive to light. Conjunctivae and EOM are  normal Nose: without d/c or deformity Neck: Neck supple. Gross normal ROM Cardiovascular: Normal rate and regular rhythm.   Pulmonary/Chest: Effort normal and breath sounds without rales or wheezing.  Abd:  Soft, NT, ND, + BS, no organomegaly Neurological: Pt is alert. At baseline orientation, motor grossly intact Skin: Skin is warm. No rashes, other new lesions, no LE edema Psychiatric: Pt behavior is normal without agitation  All otherwise neg per pt  Lab Results  Component Value Date   WBC 12.8 (H) 01/17/2019   HGB 12.2 (L) 01/17/2019   HCT 39.6 01/17/2019   PLT 322 01/17/2019   GLUCOSE 115 (H) 02/07/2019   CHOL 118 02/07/2019   TRIG 140.0 02/07/2019   HDL 59.90 02/07/2019   LDLCALC 30 02/07/2019   ALT 34 02/07/2019   AST 21 02/07/2019   NA 141 02/07/2019   K 4.9 02/07/2019   CL 107 02/07/2019   CREATININE 0.91 02/07/2019   BUN 14 02/07/2019   CO2 30 02/07/2019   TSH 1.31 01/11/2019   PSA 6.94 (H) 02/07/2019   HGBA1C 7.8 (H) 02/07/2019   MICROALBUR <0.7 07/11/2018   POCT HgB A1C Order: [id number] Status:  Final result Visible to patient:  No (scheduled for 05/10/2019 5:33 PM) Dx:  Type 2 diabetes mellitus without comp...  Ref Range & Units 16:32 3 mo ago 10 mo ago 1 yr ago 2 yr ago  Hemoglobin A1C 4.0 - 5.6 % 6.7Abnormal   7.8High  R, CM  7.1High  R, CM  7.2High  R, CM  6.9High             Assessment & Plan:

## 2019-05-10 NOTE — Patient Instructions (Signed)
Your A1c was OK today  Please continue all other medications as before, and refills have been done if requested.  Please have the pharmacy call with any other refills you may need.  Please continue your efforts at being more active, low cholesterol diet, and weight control  Please keep your appointments with your specialists as you may have planned  Please make an Appointment to return in 6 months, or sooner if needed 

## 2019-05-10 NOTE — Assessment & Plan Note (Signed)
Improved, s/p mulitple biopsy,  to f/u any worsening symptoms or concerns

## 2019-05-10 NOTE — Assessment & Plan Note (Addendum)
For a1c today, tolerating new metformin,  to f/u any worsening symptoms or concerns  I spent 32 minutes in preparing to see the patient by review of recent labs, imaging and procedures, obtaining and reviewing separately obtained history, communicating with the patient and family or caregiver, ordering medications, tests or procedures, and documenting clinical information in the EHR including the differential Dx, treatment, and any further evaluation and other management of dm, elevated psa, gerd, hLD

## 2019-07-19 ENCOUNTER — Other Ambulatory Visit: Payer: Self-pay | Admitting: Internal Medicine

## 2019-07-19 NOTE — Telephone Encounter (Signed)
Please refill as per office routine med refill policy (all routine meds refilled for 3 mo or monthly per pt preference up to one year from last visit, then month to month grace period for 3 mo, then further med refills will have to be denied)  

## 2019-08-22 ENCOUNTER — Other Ambulatory Visit: Payer: Self-pay | Admitting: Internal Medicine

## 2019-08-22 NOTE — Telephone Encounter (Signed)
Please refill as per office routine med refill policy (all routine meds refilled for 3 mo or monthly per pt preference up to one year from last visit, then month to month grace period for 3 mo, then further med refills will have to be denied)  

## 2019-10-05 ENCOUNTER — Telehealth: Payer: Self-pay | Admitting: Internal Medicine

## 2019-10-05 NOTE — Telephone Encounter (Signed)
PAD screening Left leg moderate reading  [place]  [phone]

## 2019-11-15 ENCOUNTER — Telehealth: Payer: Self-pay | Admitting: Internal Medicine

## 2019-11-15 NOTE — Telephone Encounter (Signed)
Patient requesting orders for diabetic testing supplies. Patient states he was told to check blood sugar but he never received an order for kit and supplies. Pharmacy:[id number] [address], [place]

## 2019-11-16 NOTE — Telephone Encounter (Signed)
Sent to Dr. John. 

## 2019-11-17 MED ORDER — FREESTYLE LIBRE READER DEVI: 0 refills | Status: DC

## 2019-11-17 MED ORDER — FREESTYLE LIBRE SENSOR SYSTEM MISC: 3 refills | Status: DC

## 2019-11-17 NOTE — Telephone Encounter (Signed)
Ok for [employer] if covered by [employer]

## 2020-01-04 ENCOUNTER — Ambulatory Visit: Payer: Medicare Other | Admitting: Family

## 2020-01-04 ENCOUNTER — Other Ambulatory Visit (INDEPENDENT_AMBULATORY_CARE_PROVIDER_SITE_OTHER): Payer: Medicare Other

## 2020-01-04 ENCOUNTER — Encounter: Payer: Self-pay | Admitting: Family

## 2020-01-04 ENCOUNTER — Ambulatory Visit (INDEPENDENT_AMBULATORY_CARE_PROVIDER_SITE_OTHER)
Admission: RE | Admit: 2020-01-04 | Discharge: 2020-01-04 | Disposition: A | Discharge status: Home / Self Care | Payer: Medicare Other | Source: Ambulatory Visit | Attending: Family | Admitting: Family

## 2020-01-04 ENCOUNTER — Other Ambulatory Visit: Payer: Self-pay

## 2020-01-04 VITALS — BP 132/78 | HR 72 | Temp 98.6°F | Ht 69.0 in | Wt 182.0 lb

## 2020-01-04 DIAGNOSIS — M79672 Pain in left foot: Secondary | ICD-10-CM

## 2020-01-04 LAB — COMPREHENSIVE METABOLIC PANEL
ALT: 10 U/L (ref 0–53)
AST: 9 U/L (ref 0–37)
Albumin: 4.3 g/dL (ref 3.5–5.2)
Alkaline Phosphatase: 69 U/L (ref 39–117)
BUN: 19 mg/dL (ref 6–23)
CO2: 27 mEq/L (ref 19–32)
Calcium: 10 mg/dL (ref 8.4–10.5)
Chloride: 107 mEq/L (ref 96–112)
Creatinine, Ser: 1.16 mg/dL (ref 0.40–1.50)
GFR: 62.29 mL/min (ref >60.00)
Glucose, Bld: 109 mg/dL — ABNORMAL HIGH (ref 70–99)
Potassium: 3.9 mEq/L (ref 3.5–5.1)
Sodium: 140 mEq/L (ref 135–145)
Total Bilirubin: 0.3 mg/dL (ref 0.2–1.2)
Total Protein: 7.5 g/dL (ref 6.0–8.3)

## 2020-01-04 LAB — URIC ACID: Uric Acid, Serum: 5.1 mg/dL (ref 4.0–7.8)

## 2020-01-04 NOTE — Progress Notes (Signed)
Vincent [name] is a 74 y.o. male with the following history as recorded in EpicCare:  Patient Active Problem List   Diagnosis Date Noted  . Viral pneumonia 01/13/2019  . Pneumonia due to COVID-19 virus 01/13/2019  . BPH with obstruction/lower urinary tract symptoms 10/27/2018  . Chest pain 08/02/2018  . Right shoulder pain 07/16/2018  . Vitamin D deficiency 07/16/2018  . Abdominal pain 10/28/2017  . Hyperlipidemia 09/21/2016  . Diabetes ([place]) 09/10/2014  . Neck pain on left side 09/08/2013  . History of prostatitis 04/11/2011  . 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 Anemia, unspecified 04/11/2011  . Allergic rhinitis, cause unspecified 02/08/2011  . GERD (gastroesophageal reflux disease) 02/08/2011  . Elevated PSA 02/08/2011  . BPH (benign prostatic hypertrophy) 02/08/2011  . Chronic low back pain 02/08/2011  . Lumbar disc disease 02/06/2011  . 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 Encounter for well adult exam with abnormal findings 12/19/2010    Current Outpatient Medications  Medication Sig Dispense Refill  . amLODipine (NORVASC) 5 MG tablet TAKE 1 TABLET(5 MG) BY MOUTH DAILY 90 tablet 1  . aspirin 81 MG EC tablet Take 1 tablet (81 mg total) by mouth daily. Swallow whole. 30 tablet 12  . Continuous Blood Gluc Receiver (FREESTYLE LIBRE READER) DEVI Use as directed twice daily E11.9 1 each 0  . Continuous Blood Gluc Sensor ([place]) MISC Use as directed twice daily E11.9 6 each 3  . finasteride (PROSCAR) 5 MG tablet TK 1 T PO QHS (Patient taking differently: Take 5 mg by mouth daily. ) 90 tablet 3  . lansoprazole (PREVACID) 30 MG capsule TAKE 1 CAPSULE(30 MG) BY MOUTH DAILY 90 capsule 2  . metFORMIN (GLUCOPHAGE-XR) 500 MG 24 hr tablet Take 1 tablet (500 mg total) by mouth daily with breakfast. 90 tablet 3  . rosuvastatin (CRESTOR) 20 MG tablet TAKE 1 TABLET(20 MG) BY MOUTH DAILY 90 tablet 2  .  tamsulosin (FLOMAX) 0.4 MG CAPS capsule Take 0.4 mg by mouth every evening.   8  . Vitamin D, Ergocalciferol, (DRISDOL) 1.25 MG (50000 UT) CAPS capsule Take 1 capsule (50,000 Units total) by mouth every 7 (seven) days. 12 capsule 0   No current facility-administered medications for this visit.    Allergies: Chlorhexidine  Past Medical History:  Diagnosis Date  . Allergic rhinitis, cause unspecified 02/08/2011  . Anemia, unspecified 04/11/2011  . Arthritis    knee  . 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 BPH (benign prostatic hypertrophy) 02/08/2011  . Cataract    as a child, blind since 32 or 61 yrs old  . 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 Diabetes mellitus type 2, diet-controlled ([place])   . Diverticulosis   . Elevated PSA 02/08/2011  . GERD (gastroesophageal reflux disease) 02/08/2011  . 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/2011   pt unaware  . History of colon polyps   . History of kidney stones   . History of prostatitis 04/11/2011  . Hyperlipidemia   . Lumbar disc disease 02/06/2011  . Vitamin D deficiency   . Wears partial dentures    upper and lower    Past Surgical History:  Procedure Laterality Date  . BACK SURGERY     x 3  L4-L5  . COLONOSCOPY  2007   Dr Wynetta Emery  . CYST EXCISION    . THULIUM LASER TURP (TRANSURETHRAL RESECTION OF PROSTATE) N/A 10/27/2018   Procedure: Marcelino Duster  LASER TURP (TRANSURETHRAL RESECTION OF PROSTATE);  Surgeon: Festus Aloe, MD;  Location: [facility];  Service: Urology;  Laterality: N/A;  . WISDOM TOOTH EXTRACTION      Family History  Problem Relation Age of Onset  . Blindness Other   . Heart disease Other   . Hypertension Other   . Diabetes Other   . Mental illness Other   . Sudden death Other   . Colon cancer Neg Hx   . Esophageal cancer Neg Hx   . Stomach cancer Neg Hx   . Rectal cancer Neg Hx     Social History   Tobacco Use  . Smoking status: Former Smoker    Types:  Cigarettes    Quit date: 05/21/1963    Years since quitting: 56.6  . Smokeless tobacco: Never Used  Substance Use Topics  . Alcohol use: No    Alcohol/week: 0.0 standard drinks    Subjective:  Concerned for 1 day history of left foot pain/ swelling; no known injury or trauma; notes that he took Ibuprofen 800 mg last night with some benefit; no pain or swelling in legs; no prior history of gout;    Objective:  Vitals:   01/04/20 1034  BP: 132/78  Pulse: 72  Temp: 98.6 F (37 C)  TempSrc: Oral  SpO2: 98%  Weight: 182 lb (82.6 kg)  Height: _0  (1.753 m)    General: Well developed, well nourished, in no acute distress  Skin : Warm and dry.  Head: Normocephalic and atraumatic  Lungs: Respirations unlabored;  Musculoskeletal: No deformities; no active joint inflammation  Extremities: No edema, cyanosis, clubbing  Vessels: Symmetric bilaterally  Neurologic: Alert and oriented; speech intact; face symmetrical; moves all extremities well; CNII-XII intact without focal deficit   Assessment:  1. Left foot pain     Plan:  Exam is unremarkable; will update labs and X-ray today; follow-up to be determined;  This visit occurred during the SARS-CoV-2 public health emergency.  Safety protocols were in place, including screening questions prior to the visit, additional usage of staff PPE, and extensive cleaning of exam room while observing appropriate contact time as indicated for disinfecting solutions.     No follow-ups on file.  Orders Placed This Encounter  Procedures  . DG Foot Complete Left    Standing Status:   Future    Number of Occurrences:   1    Standing Expiration Date:   01/03/2021    Order Specific Question:   Reason for Exam (SYMPTOM  OR DIAGNOSIS REQUIRED)    Answer:   left foot pain    Order Specific Question:   Preferred imaging location?    Answer:   [facility]  . Comp Met (CMET)    Standing Status:   Future    Number of Occurrences:   1    Standing  Expiration Date:   01/03/2021  . Uric acid    Standing Status:   Future    Number of Occurrences:   1    Standing Expiration Date:   01/03/2021    Requested Prescriptions    No prescriptions requested or ordered in this encounter

## 2020-01-14 ENCOUNTER — Other Ambulatory Visit: Payer: Self-pay | Admitting: Internal Medicine

## 2020-01-14 MED ORDER — AMLODIPINE BESYLATE 5 MG PO TABS: ORAL_TABLET | ORAL | 0 refills | Status: DC

## 2020-04-08 ENCOUNTER — Other Ambulatory Visit: Payer: Self-pay

## 2020-04-08 MED ORDER — METFORMIN HCL ER 500 MG PO TB24: 500.0000 mg | ORAL_TABLET | Freq: Every day | ORAL | 3 refills | Status: DC

## 2020-04-15 ENCOUNTER — Other Ambulatory Visit: Payer: Self-pay

## 2020-04-15 ENCOUNTER — Telehealth: Payer: Self-pay

## 2020-04-15 MED ORDER — AMLODIPINE BESYLATE 5 MG PO TABS: ORAL_TABLET | ORAL | 2 refills | Status: DC

## 2020-04-15 MED ORDER — LANSOPRAZOLE 30 MG PO CPDR: DELAYED_RELEASE_CAPSULE | ORAL | 2 refills | Status: DC

## 2020-04-15 NOTE — Telephone Encounter (Signed)
Left pt voicemail to call office to schedule and physical.

## 2020-04-15 NOTE — Telephone Encounter (Signed)
Received hard copy refill request for ibp.Marland Kitchen...request denied   Patient notified via voicemail that they are due for ov

## 2020-04-16 ENCOUNTER — Telehealth: Payer: Self-pay

## 2020-04-16 MED ORDER — METFORMIN HCL ER 500 MG PO TB24: 500.0000 mg | ORAL_TABLET | Freq: Every day | ORAL | 3 refills | Status: DC

## 2020-04-16 MED ORDER — ROSUVASTATIN CALCIUM 20 MG PO TABS: ORAL_TABLET | ORAL | 2 refills | Status: DC

## 2020-04-16 NOTE — Addendum Note (Signed)
Addended by: Elza Rafter D on: 04/16/2020 12:31 PM   Modules accepted: Orders

## 2020-04-16 NOTE — Telephone Encounter (Signed)
Received refill request from [facility].  Confirmed that pt has appt on 04/22/20 & that pt is not out of Metformin at this time.  Pt advised that he must make appt date/time; verb understanding.  Will refill Metformin to [facility]; pt aware.

## 2020-04-22 ENCOUNTER — Other Ambulatory Visit: Payer: Self-pay

## 2020-04-22 ENCOUNTER — Encounter: Payer: Self-pay | Admitting: Internal Medicine

## 2020-04-22 ENCOUNTER — Ambulatory Visit (INDEPENDENT_AMBULATORY_CARE_PROVIDER_SITE_OTHER): Payer: Medicare Other | Admitting: Internal Medicine

## 2020-04-22 VITALS — BP 142/90 | HR 57 | Temp 97.8°F | Ht 69.0 in | Wt 184.0 lb

## 2020-04-22 DIAGNOSIS — E559 Vitamin D deficiency, unspecified: Secondary | ICD-10-CM

## 2020-04-22 DIAGNOSIS — E538 Deficiency of other specified B group vitamins: Secondary | ICD-10-CM

## 2020-04-22 DIAGNOSIS — R972 Elevated prostate specific antigen [PSA]: Secondary | ICD-10-CM

## 2020-04-22 DIAGNOSIS — E119 Type 2 diabetes mellitus without complications: Secondary | ICD-10-CM | POA: Diagnosis not present

## 2020-04-22 DIAGNOSIS — Z0000 Encounter for general adult medical examination without abnormal findings: Secondary | ICD-10-CM | POA: Diagnosis not present

## 2020-04-22 LAB — BASIC METABOLIC PANEL
BUN: 12 mg/dL (ref 6–23)
CO2: 28 mEq/L (ref 19–32)
Calcium: 9.8 mg/dL (ref 8.4–10.5)
Chloride: 105 mEq/L (ref 96–112)
Creatinine, Ser: 1.05 mg/dL (ref 0.40–1.50)
GFR: 70.06 mL/min (ref >60.00)
Glucose, Bld: 105 mg/dL — ABNORMAL HIGH (ref 70–99)
Potassium: 4.5 mEq/L (ref 3.5–5.1)
Sodium: 139 mEq/L (ref 135–145)

## 2020-04-22 LAB — CBC WITH DIFFERENTIAL/PLATELET
Basophils Absolute: 0 10*3/uL (ref 0.0–0.1)
Basophils Relative: 0.5 % (ref 0.0–3.0)
Eosinophils Absolute: 0.3 10*3/uL (ref 0.0–0.7)
Eosinophils Relative: 6.7 % — ABNORMAL HIGH (ref 0.0–5.0)
HCT: 39.6 % (ref 39.0–52.0)
Hemoglobin: 13 g/dL (ref 13.0–17.0)
Lymphocytes Relative: 42.1 % (ref 12.0–46.0)
Lymphs Abs: 2.1 10*3/uL (ref 0.7–4.0)
MCHC: 32.7 g/dL (ref 30.0–36.0)
MCV: 85.5 fl (ref 78.0–100.0)
Monocytes Absolute: 0.5 10*3/uL (ref 0.1–1.0)
Monocytes Relative: 10 % (ref 3.0–12.0)
Neutro Abs: 2 10*3/uL (ref 1.4–7.7)
Neutrophils Relative %: 40.7 % — ABNORMAL LOW (ref 43.0–77.0)
Platelets: 165 10*3/uL (ref 150.0–400.0)
RBC: 4.63 Mil/uL (ref 4.22–5.81)
RDW: 13.7 % (ref 11.5–15.5)
WBC: 4.9 10*3/uL (ref 4.0–10.5)

## 2020-04-22 LAB — LIPID PANEL
Cholesterol: 87 mg/dL (ref 0–200)
HDL: 47.9 mg/dL (ref >39.00)
LDL Cholesterol: 26 mg/dL (ref 0–99)
NonHDL: 39.41
Total CHOL/HDL Ratio: 2
Triglycerides: 67 mg/dL (ref 0.0–149.0)
VLDL: 13.4 mg/dL (ref 0.0–40.0)

## 2020-04-22 LAB — MICROALBUMIN / CREATININE URINE RATIO
Creatinine,U: 177.5 mg/dL
Microalb Creat Ratio: 2.3 mg/g (ref 0.0–30.0)
Microalb, Ur: 4.1 mg/dL — ABNORMAL HIGH (ref 0.0–1.9)

## 2020-04-22 LAB — URINALYSIS, ROUTINE W REFLEX MICROSCOPIC
Bilirubin Urine: NEGATIVE
Hgb urine dipstick: NEGATIVE
Ketones, ur: NEGATIVE
Leukocytes,Ua: NEGATIVE
Nitrite: NEGATIVE
RBC / HPF: NONE SEEN (ref 0–?)
Specific Gravity, Urine: 1.025 (ref 1.000–1.030)
Total Protein, Urine: NEGATIVE
Urine Glucose: NEGATIVE
Urobilinogen, UA: 0.2 (ref 0.0–1.0)
pH: 5.5 (ref 5.0–8.0)

## 2020-04-22 LAB — TSH: TSH: 1.2 u[IU]/mL (ref 0.35–4.50)

## 2020-04-22 LAB — HEPATIC FUNCTION PANEL
ALT: 18 U/L (ref 0–53)
AST: 14 U/L (ref 0–37)
Albumin: 4.4 g/dL (ref 3.5–5.2)
Alkaline Phosphatase: 73 U/L (ref 39–117)
Bilirubin, Direct: 0.1 mg/dL (ref 0.0–0.3)
Total Bilirubin: 0.4 mg/dL (ref 0.2–1.2)
Total Protein: 7.1 g/dL (ref 6.0–8.3)

## 2020-04-22 LAB — VITAMIN D 25 HYDROXY (VIT D DEFICIENCY, FRACTURES): VITD: 65.12 ng/mL (ref 30.00–100.00)

## 2020-04-22 LAB — VITAMIN B12: Vitamin B-12: 779 pg/mL (ref 211–911)

## 2020-04-22 LAB — PSA: PSA: 8.64 ng/mL — ABNORMAL HIGH (ref 0.10–4.00)

## 2020-04-22 LAB — HEMOGLOBIN A1C: Hgb A1c MFr Bld: 7.1 % — ABNORMAL HIGH (ref 4.6–6.5)

## 2020-04-22 MED ORDER — AMLODIPINE BESYLATE 5 MG PO TABS: ORAL_TABLET | ORAL | 3 refills | Status: DC

## 2020-04-22 MED ORDER — METFORMIN HCL ER 500 MG PO TB24: 500.0000 mg | ORAL_TABLET | Freq: Every day | ORAL | 3 refills | Status: DC

## 2020-04-22 NOTE — Progress Notes (Signed)
Patient ID: Vincent [name], male   DOB: [birth year], 75 y.o.   MRN: [id number]         Chief Complaint:: wellness exam and low vit d, hld, DM, GERD       HPI:  Vincent [name] is a 75 y.o. male here for wellness exam; plans to make eye exam appt on his own, o/w up to date with preventive referrals and immunizations.                         Also taking vit d, tolerating well.  No new complaints, Pt denies chest pain, increased sob or doe, wheezing, orthopnea, PND, increased LE swelling, palpitations, dizziness or syncope.   Pt denies polydipsia, polyuria  Denies new worsening focal neuro s/s.   Pt denies fever, wt loss, night sweats, loss of appetite, or other constitutional symptoms   Wt Readings from Last 3 Encounters:  04/22/20 184 lb (83.5 kg)  01/04/20 182 lb (82.6 kg)  05/10/19 180 lb 9.6 oz (81.9 kg)   BP Readings from Last 3 Encounters:  04/22/20 (!) 142/90  01/04/20 132/78  05/10/19 (!) 144/86   Immunization History  Administered Date(s) Administered  . Influenza, High Dose Seasonal PF 10/14/2018  . Influenza,inj,Quad PF,6+ Mos 12/18/2019  . Influenza-Unspecified 12/12/2009  . Moderna SARS-COV2 Booster Vaccination 12/07/2019  . Moderna Sars-Covid-2 Vaccination 03/20/2019, 04/18/2019  . Pneumococcal Conjugate-13 02/13/2004, 08/01/2012  . Pneumococcal Polysaccharide-23 09/10/2014  . Td 06/15/2010  . Zoster 06/11/2009   Health Maintenance Due  Topic Date Due  . OPHTHALMOLOGY EXAM  10/08/2018      Past Medical History:  Diagnosis Date  . Allergic rhinitis, cause unspecified 02/08/2011  . Anemia, unspecified 04/11/2011  . Arthritis    knee  . 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 BPH (benign prostatic hypertrophy) 02/08/2011  . Cataract    as a child, blind since 55 or 75 yrs old  . 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 Diabetes mellitus type 2, diet-controlled ([place])   . Diverticulosis   . Elevated PSA 02/08/2011  . GERD  (gastroesophageal reflux disease) 02/08/2011  . 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/2011   pt unaware  . History of colon polyps   . History of kidney stones   . History of prostatitis 04/11/2011  . Hyperlipidemia   . Lumbar disc disease 02/06/2011  . Vitamin D deficiency   . Wears partial dentures    upper and lower   Past Surgical History:  Procedure Laterality Date  . BACK SURGERY     x 3  L4-L5  . COLONOSCOPY  2007   Dr Wynetta Emery  . CYST EXCISION    . THULIUM LASER TURP (TRANSURETHRAL RESECTION OF PROSTATE) N/A 10/27/2018   Procedure: THULIUM LASER TURP (TRANSURETHRAL RESECTION OF PROSTATE);  Surgeon: Festus Aloe, MD;  Location: [facility];  Service: Urology;  Laterality: N/A;  . WISDOM TOOTH EXTRACTION      reports that he quit smoking about 56 years ago. His smoking use included cigarettes. He has never used smokeless tobacco. He reports that he does not drink alcohol and does not use drugs. family history includes Blindness in an other family member; Diabetes in an other family member; Heart disease in an other family member; Hypertension in an other family member; Mental illness in an other family member; Sudden death in an other family member. Allergies  Allergen Reactions  . Chlorhexidine    Current  Outpatient Medications on File Prior to Visit  Medication Sig Dispense Refill  . aspirin 81 MG EC tablet Take 1 tablet (81 mg total) by mouth daily. Swallow whole. 30 tablet 12  . Continuous Blood Gluc Receiver (FREESTYLE LIBRE READER) DEVI Use as directed twice daily E11.9 1 each 0  . Continuous Blood Gluc Sensor ([place]) MISC Use as directed twice daily E11.9 6 each 3  . finasteride (PROSCAR) 5 MG tablet TK 1 T PO QHS (Patient taking differently: Take 5 mg by mouth daily.) 90 tablet 3  . lansoprazole (PREVACID) 30 MG capsule TAKE 1 CAPSULE(30 MG) BY MOUTH DAILY 30 capsule 2  . rosuvastatin (CRESTOR) 20 MG tablet TAKE 1 TABLET(20 MG) BY  MOUTH DAILY 90 tablet 2  . tamsulosin (FLOMAX) 0.4 MG CAPS capsule Take 0.4 mg by mouth every evening.   8   No current facility-administered medications on file prior to visit.        ROS:  All others reviewed and negative.  Objective        PE:  BP (!) 142/90   Pulse (!) 57   Temp 97.8 F (36.6 C) (Oral)   Ht 5\' 9"  (1.753 m)   Wt 184 lb (83.5 kg)   SpO2 99%   BMI 27.17 kg/m                 Constitutional: Pt appears in NAD               HENT: Head: NCAT.                Right Ear: External ear normal.                 Left Ear: External ear normal.                Eyes: . Pupils are equal, round, and reactive to light. Conjunctivae and EOM are normal               Nose: without d/c or deformity               Neck: Neck supple. Gross normal ROM               Cardiovascular: Normal rate and regular rhythm.                 Pulmonary/Chest: Effort normal and breath sounds without rales or wheezing.                Abd:  Soft, NT, ND, + BS, no organomegaly               Neurological: Pt is alert. At baseline orientation, motor grossly intact               Skin: Skin is warm. No rashes, no other new lesions, LE edema - none               Psychiatric: Pt behavior is normal without agitation   Micro: none  Cardiac tracings I have personally interpreted today:  none  Pertinent Radiological findings (summarize): none   Lab Results  Component Value Date   WBC 4.9 04/22/2020   HGB 13.0 04/22/2020   HCT 39.6 04/22/2020   PLT 165.0 04/22/2020   GLUCOSE 105 (H) 04/22/2020   CHOL 87 04/22/2020   TRIG 67.0 04/22/2020   HDL 47.90 04/22/2020   LDLCALC 26 04/22/2020   ALT 18 04/22/2020   AST 14 04/22/2020  NA 139 04/22/2020   K 4.5 04/22/2020   CL 105 04/22/2020   CREATININE 1.05 04/22/2020   BUN 12 04/22/2020   CO2 28 04/22/2020   TSH 1.20 04/22/2020   PSA 8.64 (H) 04/22/2020   HGBA1C 7.1 (H) 04/22/2020   MICROALBUR 4.1 (H) 04/22/2020   Assessment/Plan:  Vincent [name] is a 75 y.o. Black or African American [2] male with  has a past medical history of Allergic rhinitis, cause unspecified (02/08/2011), Anemia, unspecified (04/11/2011), Arthritis, Blindness (02/06/2011), BPH (benign prostatic hypertrophy) (02/08/2011), Cataract, Chronic LBP (02/08/2011), DDD (degenerative disc disease), cervical (04/11/2011), Diabetes mellitus type 2, diet-controlled ([place]), Diverticulosis, Elevated PSA (02/08/2011), GERD (gastroesophageal reflux disease) (02/08/2011), Hepatitis B antibody positive (04/11/2011), History of colon polyps, History of kidney stones, History of prostatitis (04/11/2011), Hyperlipidemia, Lumbar disc disease (02/06/2011), Vitamin D deficiency, and Wears partial dentures.  Preventative health care Age and sex appropriate education and counseling updated with regular exercise and diet Referrals for preventative services - pt to make own eye appt Immunizations addressed - none needed Smoking counseling  - none needed Evidence for depression or other mood disorder - none significant Most recent labs reviewed. I have personally reviewed and have noted: 1) the patient's medical and social history 2) The patient's current medications and supplements 3) The patient's height, weight, and BMI have been recorded in the chart   Elevated PSA Asympt, for f/u psa  Diabetes [facility]) Lab Results  Component Value Date   HGBA1C 7.1 (H) 04/22/2020   Stable, pt to continue current medical treatment metformin   Vitamin D deficiency Last vitamin D Lab Results  Component Value Date   VD25OH 65.12 04/22/2020   Stable, cont oral replacement  Followup: Return in about 6 months (around 10/23/2020).  [name], MD 04/28/2020 5:42 AM [facility]

## 2020-04-22 NOTE — Patient Instructions (Signed)
Please continue all other medications as before, and refills have been done if requested.  Please have the pharmacy call with any other refills you may need.  Please continue your efforts at being more active, low cholesterol diet, and weight control.  You are otherwise up to date with prevention measures today.  Please keep your appointments with your specialists as you may have planned  Please continue the Vit D 2000 units per day  Please go to the LAB at the blood drawing area for the tests to be done  You will be contacted by phone if any changes need to be made immediately.  Otherwise, you will receive a letter about your results with an explanation, but please check with MyChart first.  Please remember to sign up for MyChart if you have not done so, as this will be important to you in the future with finding out test results, communicating by private email, and scheduling acute appointments online when needed.  Please make an Appointment to return in 6 months, or sooner if needed, also with Lab done at the [facility] a few days before your visit

## 2020-04-28 ENCOUNTER — Encounter: Payer: Self-pay | Admitting: Internal Medicine

## 2020-04-28 NOTE — Assessment & Plan Note (Signed)
Age and sex appropriate education and counseling updated with regular exercise and diet Referrals for preventative services - pt to make own eye appt Immunizations addressed - none needed Smoking counseling  - none needed Evidence for depression or other mood disorder - none significant Most recent labs reviewed. I have personally reviewed and have noted: 1) the patient's medical and social history 2) The patient's current medications and supplements 3) The patient's height, weight, and BMI have been recorded in the chart

## 2020-04-28 NOTE — Addendum Note (Signed)
Addended by: Biagio Borg on: 04/28/2020 05:43 AM   Modules accepted: Orders

## 2020-04-28 NOTE — Assessment & Plan Note (Signed)
Last vitamin D Lab Results  Component Value Date   VD25OH 65.12 04/22/2020   Stable, cont oral replacement

## 2020-04-28 NOTE — Assessment & Plan Note (Signed)
Asympt, for f/u psa 

## 2020-04-28 NOTE — Assessment & Plan Note (Signed)
Lab Results  Component Value Date   HGBA1C 7.1 (H) 04/22/2020   Stable, pt to continue current medical treatment metformin

## 2020-04-29 ENCOUNTER — Telehealth: Payer: Self-pay | Admitting: Internal Medicine

## 2020-04-29 NOTE — Telephone Encounter (Signed)
Spoke with patient regarding medications. Patient was inquiring about a message he received on Mychart about directions of medications. Confirmed with patient how many times daily he should take meds. Patient verbalized understanding and was agreeable. Okay to close encounter.

## 2020-04-29 NOTE — Telephone Encounter (Signed)
Patient is requesting a call back in regards to his medications on his AVS and his recent test results. He can be reached at [phone]

## 2020-05-30 ENCOUNTER — Telehealth: Payer: Self-pay | Admitting: Internal Medicine

## 2020-05-30 NOTE — Telephone Encounter (Signed)
Patient called and was wondering if he needed to continue to take a baby Asprin. He can be reached at [phone]

## 2020-05-30 NOTE — Telephone Encounter (Signed)
Yes to aspirin as he has a > 10% chance of heart disease over the next 10 yrs due to his other medical problems for which he is being treated

## 2020-06-02 NOTE — Telephone Encounter (Signed)
Patient notified of Dr. Jenny Reichmann last note and verbally understood.

## 2020-07-05 ENCOUNTER — Other Ambulatory Visit: Payer: Self-pay | Admitting: Internal Medicine

## 2020-07-06 NOTE — Telephone Encounter (Signed)
Please refill as per office routine med refill policy (all routine meds refilled for 3 mo or monthly per pt preference up to one year from last visit, then month to month grace period for 3 mo, then further med refills will have to be denied)  

## 2020-07-07 ENCOUNTER — Other Ambulatory Visit: Payer: Self-pay | Admitting: Internal Medicine

## 2020-07-07 NOTE — Telephone Encounter (Signed)
Please refill as per office routine med refill policy (all routine meds refilled for 3 mo or monthly per pt preference up to one year from last visit, then month to month grace period for 3 mo, then further med refills will have to be denied)  

## 2020-09-17 ENCOUNTER — Other Ambulatory Visit: Payer: Self-pay | Admitting: Internal Medicine

## 2020-11-10 ENCOUNTER — Telehealth: Payer: Self-pay | Admitting: Internal Medicine

## 2020-11-10 DIAGNOSIS — Z1211 Encounter for screening for malignant neoplasm of colon: Secondary | ICD-10-CM

## 2020-11-10 NOTE — Telephone Encounter (Signed)
Ok referral is done.

## 2020-11-10 NOTE — Telephone Encounter (Signed)
Pt would like a colonoscopy referral

## 2020-11-13 ENCOUNTER — Encounter: Payer: Self-pay | Admitting: Gastroenterology

## 2020-11-17 NOTE — Telephone Encounter (Signed)
Patient notified via voicemail.

## 2020-12-17 ENCOUNTER — Other Ambulatory Visit: Payer: Self-pay

## 2020-12-17 ENCOUNTER — Ambulatory Visit (AMBULATORY_SURGERY_CENTER): Payer: Medicare Other

## 2020-12-17 ENCOUNTER — Encounter: Payer: Self-pay | Admitting: Gastroenterology

## 2020-12-17 VITALS — Ht 69.5 in | Wt 175.0 lb

## 2020-12-17 DIAGNOSIS — Z86010 Personal history of colonic polyps: Secondary | ICD-10-CM

## 2020-12-17 MED ORDER — PEG 3350-KCL-NA BICARB-NACL 420 G PO SOLR: 4000.0000 mL | Freq: Once | ORAL | 0 refills | Status: AC

## 2020-12-17 NOTE — Progress Notes (Signed)
Pre visit completed via phone call; Patient verified name, DOB, and address; No egg or soy allergy known to patient  No issues known to pt with past sedation with any surgeries or procedures Patient denies ever being told they had issues or difficulty with intubation  No FH of Malignant Hyperthermia Pt is not on diet pills Pt is not on home 02  Pt is not on blood thinners  Pt reports issues with constipation- patient reports he takes stool softeners for relief No A fib or A flutter Pt is fully vaccinated for Covid x 2 + booster; Coupon given to pt in PV today, Code to Pharmacy and NO PA's for preps discussed with pt in PV today  Discussed with pt there will be an out-of-pocket cost for prep and that varies from $0 to 70 +  dollars - pt verbalized understanding  Due to the COVID-19 pandemic we are asking patients to follow certain guidelines in PV and the [facility]   Pt aware of COVID protocols and [facility] guidelines

## 2020-12-29 ENCOUNTER — Other Ambulatory Visit: Payer: Self-pay | Admitting: Internal Medicine

## 2020-12-29 NOTE — Telephone Encounter (Signed)
Please refill as per office routine med refill policy (all routine meds to be refilled for 3 mo or monthly (per pt preference) up to one year from last visit, then month to month grace period for 3 mo, then further med refills will have to be denied) ? ?

## 2020-12-31 ENCOUNTER — Encounter: Payer: Self-pay | Admitting: Gastroenterology

## 2020-12-31 ENCOUNTER — Ambulatory Visit (AMBULATORY_SURGERY_CENTER): Payer: Medicare Other | Admitting: Gastroenterology

## 2020-12-31 ENCOUNTER — Other Ambulatory Visit: Payer: Self-pay

## 2020-12-31 VITALS — BP 109/67 | HR 67 | Temp 96.6°F | Resp 11 | Ht 69.5 in | Wt 175.0 lb

## 2020-12-31 DIAGNOSIS — Z86010 Personal history of colonic polyps: Secondary | ICD-10-CM

## 2020-12-31 MED ORDER — SODIUM CHLORIDE 0.9 % IV SOLN: 500.0000 mL | Freq: Once | INTRAVENOUS | Status: DC

## 2020-12-31 NOTE — Progress Notes (Signed)
VS completed by DT.  Pt's states no medical or surgical changes since previsit or office visit.  

## 2020-12-31 NOTE — Op Note (Signed)
[facility] Patient Name: Vincent [name] Procedure Date: 12/31/2020 10:31 AM MRN: [id number] Endoscopist: Remo Lipps P. Havery Moros , MD Age: 75 Referring MD:  Date of Birth: [birth year] Gender: Male Account #: [id number] Procedure:                Colonoscopy Indications:              High risk colon cancer surveillance: Personal                            history of colonic polyps (sesile serrated polyp                            removed 12/2015) Medicines:                Monitored Anesthesia Care Procedure:                Pre-Anesthesia Assessment:                           - Prior to the procedure, a History and Physical                            was performed, and patient medications and                            allergies were reviewed. The patient's tolerance of                            previous anesthesia was also reviewed. The risks                            and benefits of the procedure and the sedation                            options and risks were discussed with the patient.                            All questions were answered, and informed consent                            was obtained. Prior Anticoagulants: The patient has                            taken no previous anticoagulant or antiplatelet                            agents. ASA Grade Assessment: II - A patient with                            mild systemic disease. After reviewing the risks                            and benefits, the patient was deemed in  satisfactory condition to undergo the procedure.                           After obtaining informed consent, the colonoscope                            was passed under direct vision. Throughout the                            procedure, the patient's blood pressure, pulse, and                            oxygen saturations were monitored continuously. The                            Olympus CF-HQ190L [id number])  Colonoscope was                            introduced through the anus and advanced to the the                            cecum, identified by appendiceal orifice and                            ileocecal valve. The colonoscopy was performed                            without difficulty. The patient tolerated the                            procedure well. The quality of the bowel                            preparation was good. The ileocecal valve,                            appendiceal orifice, and rectum were photographed. Scope In: 10:40:52 AM Scope Out: 11:00:13 AM Scope Withdrawal Time: 0 hours 14 minutes 49 seconds  Total Procedure Duration: 0 hours 19 minutes 21 seconds  Findings:                 The perianal and digital rectal examinations were                            normal.                           The terminal ileum appeared normal.                           Scattered small-mouthed diverticula were found in                            the entire colon, highest burden in left colon.  Internal hemorrhoids were found during retroflexion.                           The exam was otherwise without abnormality. Complications:            No immediate complications. Estimated blood loss:                            None. Estimated Blood Loss:     Estimated blood loss: none. Impression:               - The examined portion of the ileum was normal.                           - Diverticulosis in the entire examined colon.                           - Internal hemorrhoids.                           - The examination was otherwise normal.                           - No polyps Recommendation:           - Patient has a contact number available for                            emergencies. The signs and symptoms of potential                            delayed complications were discussed with the                            patient. Return to normal activities tomorrow.                             Written discharge instructions were provided to the                            patient.                           - Resume previous diet.                           - Continue present medications.                           - No further colonoscopy exams recommended due to                            age Carlota Raspberry. Maisee Vollman, MD 12/31/2020 11:04:25 AM This report has been signed electronically.

## 2020-12-31 NOTE — Progress Notes (Signed)
To PACU, VSS. Report to [email or web address] 

## 2020-12-31 NOTE — Progress Notes (Signed)
[facility] History and Physical   Primary Care Physician:  Biagio Borg, MD   Reason for Procedure:   History of colon polyps  Plan:    colonoscopy     HPI: Vincent [name] is a 75 y.o. male  here for colonoscopy surveillance - sessile serrated polyp removed 12/2015. Patient denies any bowel symptoms at this time. No family history of colon cancer known. Otherwise feels well without any cardiopulmonary symptoms.    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   Chronic LBP 02/08/2011   DDD (degenerative disc disease), cervical 04/11/2011   Diabetes mellitus type 2, diet-controlled ([place])    on meds   Diverticulosis    Elevated PSA 02/08/2011   GERD (gastroesophageal reflux disease) 18/56/3149   with certain foods-on meds   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/2011   pt unaware   History of colon polyps    History of kidney stones    History of prostatitis 04/11/2011   Hyperlipidemia    on meds   Hypertension    on meds   Lumbar disc disease 02/06/2011   Vitamin D deficiency    Wears partial dentures    upper and lower    Past Surgical History:  Procedure Laterality Date   BACK SURGERY     x 3  L4-L5   COLONOSCOPY  2007   Dr Wynetta Emery   COLONOSCOPY  2017   SA-MAC-suprep(good)-SSP x 1   CYST EXCISION     POLYPECTOMY     SPP x 1   THULIUM LASER TURP (TRANSURETHRAL RESECTION OF PROSTATE) N/A 10/27/2018   Procedure: THULIUM LASER TURP (TRANSURETHRAL RESECTION OF PROSTATE);  Surgeon: Festus Aloe, MD;  Location: [facility];  Service: Urology;  Laterality: N/A;   WISDOM TOOTH EXTRACTION      Prior to Admission medications   Medication Sig Start Date End Date Taking? Authorizing Provider   amLODipine (NORVASC) 5 MG tablet TAKE 1 TABLET BY MOUTH  DAILY 07/07/20  Yes Biagio Borg, MD  aspirin 81 MG EC tablet Take 1 tablet (81 mg total) by mouth daily. Swallow whole. 08/01/12  Yes Biagio Borg, MD  Continuous Blood Gluc Receiver (FREESTYLE LIBRE READER) DEVI Use as directed twice daily E11.9 11/17/19  Yes Biagio Borg, MD  Continuous Blood Gluc Sensor ([place]) MISC Use as directed twice daily E11.9 11/17/19  Yes Biagio Borg, MD  finasteride (PROSCAR) 5 MG tablet TK 1 T PO QHS Patient taking differently: Take 5 mg by mouth daily. 04/29/17  Yes Biagio Borg, MD  metFORMIN (GLUCOPHAGE-XR) 500 MG 24 hr tablet Take 1 tablet (500 mg total) by mouth daily with breakfast. 04/22/20  Yes Biagio Borg, MD  Multiple Vitamin (MULTIVITAMIN PO) Take 1 tablet by mouth daily at 6 (six) AM.   Yes [provider]  rosuvastatin (CRESTOR) 20 MG tablet TAKE 1 TABLET BY MOUTH  DAILY 12/29/20  Yes Biagio Borg, MD  tamsulosin (FLOMAX) 0.4 MG CAPS capsule Take 0.4 mg by mouth every evening.  07/12/14  Yes [provider]    Current Outpatient Medications  Medication Sig Dispense Refill   amLODipine (NORVASC) 5 MG tablet  TAKE 1 TABLET BY MOUTH  DAILY 90 tablet 3   aspirin 81 MG EC tablet Take 1 tablet (81 mg total) by mouth daily. Swallow whole. 30 tablet 12   Continuous Blood Gluc Receiver (FREESTYLE LIBRE READER) DEVI Use as directed twice daily E11.9 1 each 0   Continuous Blood Gluc Sensor ([place]) MISC Use as directed twice daily E11.9 6 each 3   finasteride (PROSCAR) 5 MG tablet TK 1 T PO QHS (Patient taking differently: Take 5 mg by mouth daily.) 90 tablet 3   metFORMIN (GLUCOPHAGE-XR) 500 MG 24 hr tablet Take 1 tablet (500 mg total) by mouth daily with breakfast. 90 tablet 3   Multiple Vitamin (MULTIVITAMIN PO) Take 1 tablet by mouth daily at 6 (six) AM.     rosuvastatin (CRESTOR) 20 MG tablet TAKE 1 TABLET BY MOUTH  DAILY 90 tablet 0    tamsulosin (FLOMAX) 0.4 MG CAPS capsule Take 0.4 mg by mouth every evening.   8   Current Facility-Administered Medications  Medication Dose Route Frequency Provider Last Rate Last Admin   0.9 %  sodium chloride infusion  500 mL Intravenous Once Everett Ehrler, Carlota Raspberry, MD        Allergies as of 12/31/2020   (No Known Allergies)    Family History  Problem Relation Age of Onset   Blindness Other    Heart disease Other    Hypertension Other    Diabetes Other    Mental illness Other    Sudden death Other    Colon cancer Neg Hx    Esophageal cancer Neg Hx    Stomach cancer Neg Hx    Rectal cancer Neg Hx    Colon polyps Neg Hx     Social History   Socioeconomic History   Marital status: Married    Spouse name: Not on file   Number of children: Not on file   Years of education: Not on file   Highest education level: Not on file  Occupational History   Not on file  Tobacco Use   Smoking status: Former    Types: Cigarettes    Quit date: 05/21/1963    Years since quitting: 87.6   Smokeless tobacco: Never  Vaping Use   Vaping Use: Never used  Substance and Sexual Activity   Alcohol use: No    Alcohol/week: 0.0 standard drinks   Drug use: No   Sexual activity: Not on file  Other Topics Concern   Not on file  Social History Narrative   Not on file   Social Determinants of Health   Financial Resource Strain: Not on file  Food Insecurity: Not on file  Transportation Needs: Not on file  Physical Activity: Not on file  Stress: Not on file  Social Connections: Not on file  Intimate Partner Violence: Not on file    Review of Systems: All other review of systems negative except as mentioned in the HPI.  Physical Exam: Vital signs BP 139/86   Pulse 73   Temp (!) 96.6 F (35.9 C) (Temporal)   Ht 5' 9.5" (1.765 m)   Wt 175 lb (79.4 kg)   SpO2 100%   BMI 25.47 kg/m   General:   Alert,  Well-developed, pleasant and cooperative in NAD Lungs:  Clear throughout to  auscultation.   Heart:  Regular rate and rhythm Abdomen:  Soft, nontender and nondistended.   Neuro/Psych:  Alert and cooperative. Normal mood and affect. A and O x 3  Jolly Mango, MD [facility]

## 2020-12-31 NOTE — Patient Instructions (Addendum)
Resume previous medications.    Diverticulosis and hemorrhoids. Handouts on findings given to patient.     YOU HAD AN ENDOSCOPIC PROCEDURE TODAY AT [facility]:   Refer to the procedure report that was given to you for any specific questions about what was found during the examination.  If the procedure report does not answer your questions, please call your gastroenterologist to clarify.  If you requested that your care partner not be given the details of your procedure findings, then the procedure report has been included in a sealed envelope for you to review at your convenience later.  YOU SHOULD EXPECT: Some feelings of bloating in the abdomen. Passage of more gas than usual.  Walking can help get rid of the air that was put into your GI tract during the procedure and reduce the bloating. If you had a lower endoscopy (such as a colonoscopy or flexible sigmoidoscopy) you may notice spotting of blood in your stool or on the toilet paper. If you underwent a bowel prep for your procedure, you may not have a normal bowel movement for a few days.  Please Note:  You might notice some irritation and congestion in your nose or some drainage.  This is from the oxygen used during your procedure.  There is no need for concern and it should clear up in a day or so.  SYMPTOMS TO REPORT IMMEDIATELY:  Following lower endoscopy (colonoscopy or flexible sigmoidoscopy):  Excessive amounts of blood in the stool  Significant tenderness or worsening of abdominal pains  Swelling of the abdomen that is new, acute  Fever of 100F or higher   For urgent or emergent issues, a gastroenterologist can be reached at any hour by calling [phone]. Do not use MyChart messaging for urgent concerns.    DIET:  We do recommend a small meal at first, but then you may proceed to your regular diet.  Drink plenty of fluids but you should avoid alcoholic beverages for 24 hours.  ACTIVITY:  You should plan to  take it easy for the rest of today and you should NOT DRIVE or use heavy machinery until tomorrow (because of the sedation medicines used during the test).    FOLLOW UP: Our staff will call the number listed on your records 48-72 hours following your procedure to check on you and address any questions or concerns that you may have regarding the information given to you following your procedure. If we do not reach you, we will leave a message.  We will attempt to reach you two times.  During this call, we will ask if you have developed any symptoms of COVID 19. If you develop any symptoms (ie: fever, flu-like symptoms, shortness of breath, cough etc.) before then, please call [phone].  If you test positive for Covid 19 in the 2 weeks post procedure, please call and report this information to [place].    If any biopsies were taken you will be contacted by phone or by letter within the next 1-3 weeks.  Please call us at [phone] if you have not heard about the biopsies in 3 weeks.    SIGNATURES/CONFIDENTIALITY: You and/or your care partner have signed paperwork which will be entered into your electronic medical record.  These signatures attest to the fact that that the information above on your After Visit Summary has been reviewed and is understood.  Full responsibility of the confidentiality of this discharge information lies with you and/or your care-partner.

## 2021-01-02 ENCOUNTER — Telehealth: Payer: Self-pay

## 2021-01-02 NOTE — Telephone Encounter (Signed)
  Follow up Call-  Call back number 12/31/2020  Post procedure Call Back phone  # [phone]  Permission to leave phone message Yes  Some recent data might be hidden     Patient questions:  Do you have a fever, pain , or abdominal swelling? No. Pain Score  0 *  Have you tolerated food without any problems? Yes.    Have you been able to return to your normal activities? Yes.    Do you have any questions about your discharge instructions: Diet   No. Medications  No. Follow up visit  No.  Do you have questions or concerns about your Care? No.  Actions: * If pain score is 4 or above: No action needed, pain <4.   Have you developed a fever since your procedure? no  2.   Have you had an respiratory symptoms (SOB or cough) since your procedure? no  3.   Have you tested positive for COVID 19 since your procedure no  4.   Have you had any family members/close contacts diagnosed with the COVID 19 since your procedure?  no   If yes to any of these questions please route to Joylene John, RN and Joella Prince, RN

## 2021-01-30 ENCOUNTER — Ambulatory Visit: Payer: Medicare Other | Admitting: Internal Medicine

## 2021-01-30 ENCOUNTER — Ambulatory Visit
Admission: EM | Admit: 2021-01-30 | Discharge: 2021-01-30 | Disposition: A | Discharge status: Home / Self Care | Payer: Medicare Other

## 2021-01-30 ENCOUNTER — Other Ambulatory Visit: Payer: Self-pay

## 2021-01-30 NOTE — ED Triage Notes (Signed)
Pt reports having right hip and leg pain (he reports having a cortisone shot recently that has not helped).

## 2021-01-30 NOTE — ED Provider Notes (Signed)
Patient advised that because this is a chronic condition he was recently seen at [facility], I recommend he redirect over to [facility] now as a walk-in patient for further evaluation and possible treatment.  Patient agreed to go now.  We checked the website for [facility] and it states that they are open until 8 PM this evening.   Lynden Oxford Scales, [place] 01/30/21 [phone]

## 2021-02-01 ENCOUNTER — Encounter (HOSPITAL_COMMUNITY): Payer: Self-pay

## 2021-02-01 ENCOUNTER — Other Ambulatory Visit: Payer: Self-pay

## 2021-02-01 ENCOUNTER — Emergency Department (HOSPITAL_COMMUNITY)
Admission: EM | Admit: 2021-02-01 | Discharge: 2021-02-02 | Disposition: A | Discharge status: Home / Self Care | Payer: Medicare HMO | Attending: Emergency Medicine | Admitting: Emergency Medicine

## 2021-02-01 ENCOUNTER — Emergency Department (HOSPITAL_COMMUNITY): Payer: Medicare HMO

## 2021-02-01 DIAGNOSIS — I10 Essential (primary) hypertension: Secondary | ICD-10-CM | POA: Diagnosis not present

## 2021-02-01 DIAGNOSIS — I82549 Chronic embolism and thrombosis of unspecified tibial vein: Secondary | ICD-10-CM | POA: Diagnosis not present

## 2021-02-01 DIAGNOSIS — R600 Localized edema: Secondary | ICD-10-CM

## 2021-02-01 DIAGNOSIS — E119 Type 2 diabetes mellitus without complications: Secondary | ICD-10-CM | POA: Insufficient documentation

## 2021-02-01 DIAGNOSIS — I82541 Chronic embolism and thrombosis of right tibial vein: Secondary | ICD-10-CM

## 2021-02-01 DIAGNOSIS — Z79899 Other long term (current) drug therapy: Secondary | ICD-10-CM | POA: Diagnosis not present

## 2021-02-01 DIAGNOSIS — Z7984 Long term (current) use of oral hypoglycemic drugs: Secondary | ICD-10-CM | POA: Diagnosis not present

## 2021-02-01 DIAGNOSIS — M7989 Other specified soft tissue disorders: Secondary | ICD-10-CM | POA: Diagnosis not present

## 2021-02-01 DIAGNOSIS — Z7982 Long term (current) use of aspirin: Secondary | ICD-10-CM | POA: Insufficient documentation

## 2021-02-01 DIAGNOSIS — R2241 Localized swelling, mass and lump, right lower limb: Secondary | ICD-10-CM | POA: Diagnosis present

## 2021-02-01 LAB — BASIC METABOLIC PANEL
Anion gap: 5 (ref 5–15)
BUN: 24 mg/dL — ABNORMAL HIGH (ref 8–23)
CO2: 25 mmol/L (ref 22–32)
Calcium: 10 mg/dL (ref 8.9–10.3)
Chloride: 112 mmol/L — ABNORMAL HIGH (ref 98–111)
Creatinine, Ser: 1.4 mg/dL — ABNORMAL HIGH (ref 0.61–1.24)
GFR, Estimated: 52 mL/min — ABNORMAL LOW (ref >60)
Glucose, Bld: 162 mg/dL — ABNORMAL HIGH (ref 70–99)
Potassium: 3.9 mmol/L (ref 3.5–5.1)
Sodium: 142 mmol/L (ref 135–145)

## 2021-02-01 LAB — CBC WITH DIFFERENTIAL/PLATELET
Abs Immature Granulocytes: 0.01 10*3/uL (ref 0.00–0.07)
Basophils Absolute: 0 10*3/uL (ref 0.0–0.1)
Basophils Relative: 0 %
Eosinophils Absolute: 0.2 10*3/uL (ref 0.0–0.5)
Eosinophils Relative: 3 %
HCT: 46.9 % (ref 39.0–52.0)
Hemoglobin: 14.6 g/dL (ref 13.0–17.0)
Immature Granulocytes: 0 %
Lymphocytes Relative: 28 %
Lymphs Abs: 1.9 10*3/uL (ref 0.7–4.0)
MCH: 27.6 pg (ref 26.0–34.0)
MCHC: 31.1 g/dL (ref 30.0–36.0)
MCV: 88.7 fL (ref 80.0–100.0)
Monocytes Absolute: 0.5 10*3/uL (ref 0.1–1.0)
Monocytes Relative: 8 %
Neutro Abs: 4.1 10*3/uL (ref 1.7–7.7)
Neutrophils Relative %: 61 %
Platelets: 120 10*3/uL — ABNORMAL LOW (ref 150–400)
RBC: 5.29 MIL/uL (ref 4.22–5.81)
RDW: 13.7 % (ref 11.5–15.5)
WBC: 6.7 10*3/uL (ref 4.0–10.5)
nRBC: 0 % (ref 0.0–0.2)

## 2021-02-01 LAB — CBG MONITORING, ED: Glucose-Capillary: 158 mg/dL — ABNORMAL HIGH (ref 70–99)

## 2021-02-01 NOTE — ED Provider Notes (Signed)
Emergency Medicine Provider Triage Evaluation Note  Vincent [name] , a 76 y.o. male  was evaluated in triage.  Pt complains of right leg pain with new foot swelling for the last couple days without numbness, tingling or weakness in the foot.  History of type 2 diabetes.  Seen by urgent care and directed to the ED.  History of chronic right leg pain for which he gets cortisone shots at [facility].  Review of Systems  Positive: Right foot pain and swelling, knee pain on the right Negative: Fevers, chills, chest pain, shortness of breath  Physical Exam  BP (!) 143/110 (BP Location: Right Arm)    Pulse 95    Temp 98.9 F (37.2 C) (Oral)    Resp 18    Ht 5\' 9"  (1.753 m)    Wt 81.6 kg    SpO2 98%    BMI 26.58 kg/m  Gen:   Awake, no distress   Resp:  Normal effort  MSK:   Moves extremities without difficulty  Other:  Right foot edema with 2+ pedal pulses bilaterally.  Normal sensation and strength in the lower extremities bilaterally.  No evidence of erythema, induration, of the right knee refuses injections.  Medical Decision Making  Medically screening exam initiated at 8:02 PM.  Appropriate orders placed.  Vincent [name] was informed that the remainder of the evaluation will be completed by another provider, this initial triage assessment does not replace that evaluation, and the importance of remaining in the ED until their evaluation is complete.  This chart was dictated using voice recognition software, Dragon. Despite the best efforts of this provider to proofread and correct errors, errors may still occur which can change documentation meaning.    Vincent [name] 02/01/21 2004    Daleen Bo, MD 02/02/21 1022

## 2021-02-01 NOTE — ED Triage Notes (Addendum)
Pt c/o right leg pain for "quite some time". Pt c/o right foot swelling x1 week. Pt states he is diabetic type 2 and has not checked his blood sugar in a few days. CBG 158 in triage.

## 2021-02-02 ENCOUNTER — Emergency Department (HOSPITAL_BASED_OUTPATIENT_CLINIC_OR_DEPARTMENT_OTHER): Payer: Medicare HMO

## 2021-02-02 DIAGNOSIS — M7989 Other specified soft tissue disorders: Secondary | ICD-10-CM

## 2021-02-02 MED ORDER — HYDROCODONE-ACETAMINOPHEN 5-325 MG PO TABS: 1.0000 | ORAL_TABLET | ORAL | 0 refills | Status: DC | PRN

## 2021-02-02 MED ORDER — ACETAMINOPHEN 325 MG PO TABS
650.0000 mg | ORAL_TABLET | Freq: Once | ORAL | Status: AC
  Administered 2021-02-02: 650 mg via ORAL
  Filled 2021-02-02: qty 2

## 2021-02-02 NOTE — Discharge Instructions (Addendum)
Return to ED with any new or worsening symptoms such as worsening pain in right leg, decreased sensation to right foot or right foot that appears cold, dark or tarry stools, bright red blood in stools Take 324 of aspirin 1x/day until you are evaluated by your PCP Follow up with PCP in next 3-5 days for ongoing evaluation and management of pain You will need re-imaging in 1 week. Please request this with your PCP.

## 2021-02-02 NOTE — Progress Notes (Signed)
Right lower extremity venous duplex has been completed. Preliminary results can be found in CV Proc through chart review.  Results were given to Dr. Jeanell Sparrow.  02/02/21 8:34 AM Vincent [name] RVT

## 2021-02-02 NOTE — ED Provider Notes (Signed)
[facility] DEPT Provider Note   CSN: [id number] Arrival date & time: 02/01/21  1859     History  Chief Complaint  Patient presents with   Leg Swelling   Right Foot Pain    Vincent [name] is a 76 y.o. male.  HPI   76 y/o M with ah /o anemia, allergic rhinitis, GERD, elevated PSA, BPH, arthritis, diabetes, diverticulosis, nephrolithiasis, polyps, hyperlipidemia, hypertension, who presents the emergency department today for evaluation of right foot pain and swelling that started a week ago.  Has intermittent discomfort to the remainder of the leg as well.  He denies any known injury recently.  There are no reported fevers.  Denies any chest pain or shortness of breath.  Denies any recent surgeries, hospital admissions, or history of VTE.  Home Medications Prior to Admission medications   Medication Sig Start Date End Date Taking? Authorizing Provider  amLODipine (NORVASC) 5 MG tablet TAKE 1 TABLET BY MOUTH  DAILY 07/07/20   Biagio Borg, MD  aspirin 81 MG EC tablet Take 1 tablet (81 mg total) by mouth daily. Swallow whole. 08/01/12   Biagio Borg, MD  Continuous Blood Gluc Receiver (FREESTYLE LIBRE READER) DEVI Use as directed twice daily E11.9 11/17/19   Biagio Borg, MD  Continuous Blood Gluc Sensor (Volin) MISC Use as directed twice daily E11.9 11/17/19   Biagio Borg, MD  finasteride (PROSCAR) 5 MG tablet TK 1 T PO QHS Patient taking differently: Take 5 mg by mouth daily. 04/29/17   Biagio Borg, MD  metFORMIN (GLUCOPHAGE-XR) 500 MG 24 hr tablet Take 1 tablet (500 mg total) by mouth daily with breakfast. 04/22/20   Biagio Borg, MD  Multiple Vitamin (MULTIVITAMIN PO) Take 1 tablet by mouth daily at 6 (six) AM.    [provider]  rosuvastatin (CRESTOR) 20 MG tablet TAKE 1 TABLET BY MOUTH  DAILY 12/29/20   Biagio Borg, MD  tamsulosin (FLOMAX) 0.4 MG CAPS capsule Take 0.4 mg by mouth every evening.  07/12/14    [provider]      Allergies    Patient has no known allergies.    Review of Systems   Review of Systems  Constitutional:  Negative for fever.  HENT:  Negative for ear pain and sore throat.   Eyes:  Negative for visual disturbance.  Respiratory:  Negative for shortness of breath.   Cardiovascular:  Positive for leg swelling. Negative for chest pain.  Gastrointestinal:  Negative for abdominal pain and vomiting.  Genitourinary:  Negative for flank pain.  Musculoskeletal:  Positive for arthralgias (foot pain). Negative for back pain.  Neurological:  Negative for weakness and numbness.  All other systems reviewed and are negative.  Physical Exam Updated Vital Signs BP (!) 156/102 (BP Location: Right Arm)    Pulse 94    Temp 98 F (36.7 C)    Resp 18    Ht 5\' 9"  (1.753 m)    Wt 81.6 kg    SpO2 99%    BMI 26.58 kg/m  Physical Exam Vitals and nursing note reviewed.  Constitutional:      General: He is not in acute distress.    Appearance: He is well-developed.  HENT:     Head: Normocephalic and atraumatic.  Eyes:     Conjunctiva/sclera: Conjunctivae normal.  Cardiovascular:     Rate and Rhythm: Normal rate and regular rhythm.     Heart sounds: No murmur heard.  Pulmonary:     Effort: Pulmonary effort is normal. No respiratory distress.     Breath sounds: Normal breath sounds.  Abdominal:     Palpations: Abdomen is soft.     Tenderness: There is no abdominal tenderness.  Musculoskeletal:        General: No swelling.     Cervical back: Neck supple.     Right lower leg: Edema present.     Comments: Edema to the right foot/ankle. No pain with rom of the ankle joint. Dp pulse is strong. No discoloration. No calf ttp.   Skin:    General: Skin is warm and dry.     Capillary Refill: Capillary refill takes less than 2 seconds.  Neurological:     Mental Status: He is alert.  Psychiatric:        Mood and Affect: Mood normal.    ED Results / Procedures / Treatments    Labs (all labs ordered are listed, but only abnormal results are displayed) Labs Reviewed  BASIC METABOLIC PANEL - Abnormal; Notable for the following components:      Result Value   Chloride 112 (*)    Glucose, Bld 162 (*)    BUN 24 (*)    Creatinine, Ser 1.40 (*)    GFR, Estimated 52 (*)    All other components within normal limits  CBC WITH DIFFERENTIAL/PLATELET - Abnormal; Notable for the following components:   Platelets 120 (*)    All other components within normal limits  CBG MONITORING, ED - Abnormal; Notable for the following components:   Glucose-Capillary 158 (*)    All other components within normal limits    EKG None  Radiology DG Foot Complete Right  Result Date: 02/01/2021 CLINICAL DATA:  Right foot swelling EXAM: RIGHT FOOT COMPLETE - 3+ VIEW COMPARISON:  None. FINDINGS: Normal alignment. No acute fracture or dislocation. Joint spaces are preserved. No erosions. Soft tissues are unremarkable. IMPRESSION: Negative. Electronically Signed   By: Fidela Salisbury M.D.   On: 02/01/2021 20:53    Procedures Procedures    Medications Ordered in ED Medications  acetaminophen (TYLENOL) tablet 650 mg (has no administration in time range)    ED Course/ Medical Decision Making/ A&P                           Medical Decision Making  76 y/o male here for eval of right ankle/foot swelling and pain. Denies known trauma.   Right ankle/foot xray reviewed/interpreted and neg  Labs reviewed.  No leukocytosis, mild thrombocytopenia Mild aki  RLE [place] ordered. At shift change, care transitioned to Astrid Drafts to f/o on pending [place].    Final Clinical Impression(s) / ED Diagnoses Final diagnoses:  Leg edema    Rx / DC Orders ED Discharge Orders     None         Bishop Dublin 02/02/21 6213    Shanon Rosser, MD 02/02/21 0630

## 2021-02-02 NOTE — ED Provider Notes (Signed)
Physical Exam  BP 130/78    Pulse 66    Temp 98 F (36.7 C)    Resp 18    Ht 5\' 9"  (1.753 m)    Wt 81.6 kg    SpO2 99%    BMI 26.58 kg/m   Physical Exam Vitals and nursing note reviewed.  Constitutional:      General: He is not in acute distress.    Appearance: He is not ill-appearing or toxic-appearing.  HENT:     Head: Normocephalic and atraumatic.     Mouth/Throat:     Mouth: Mucous membranes are moist.  Eyes:     Extraocular Movements: Extraocular movements intact.  Cardiovascular:     Rate and Rhythm: Normal rate and regular rhythm.  Pulmonary:     Effort: Pulmonary effort is normal.     Breath sounds: Normal breath sounds. No wheezing.  Abdominal:     General: Abdomen is flat. There is no distension.     Palpations: Abdomen is soft.     Tenderness: There is no abdominal tenderness.  Musculoskeletal:     Right lower leg: Edema present.     Left lower leg: Normal.     Left foot: Normal.     Comments: Edema to right lower leg.  No tenderness to palpation of calf.  Strong DP pulse.  No discoloration.  No pain with passive range of motion of right ankle.  Skin:    General: Skin is warm and dry.     Capillary Refill: Capillary refill takes less than 2 seconds.  Neurological:     Mental Status: He is alert and oriented to person, place, and time.  Psychiatric:        Mood and Affect: Mood normal.    ED Course/Procedures     Procedures  MDM  This patient was signed out to me from [place] at shift change with current plan for DVT ultrasound study.  In short, this is a 76 year old male with history of anemia, allergic rhinitis, BPH, arthritis, diabetes, diverticulosis, hyperlipidemia, hypertension presents to ED for evaluation of right foot pain and swelling that started 1 week ago.  Patient plain film x-ray shows no signs of fracture or cause of pain.    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   Clot does not extend above the mid calf.  I consulted with Dr. Carlis Abbott of vascular surgery who stated that patients with chronic appearing clots typically not anticoagulated.  Dr. Carlis Abbott continued however that if there is no other etiology for the pain the patient could be put on 3 months of anticoagulation.  Dr. Carlis Abbott also stated that patient could be reimaged in 1 week and put on aspirin for now.  I discussed these options with the patient and advised him of the risks and benefits of long-term anticoagulation therapy as well as risk for blood clot to become larger which could lead to larger issues such as PE. The patient voiced understanding with his options and risks.   After shared decision-making conversation with the patient, patient stated that he wished to put on aspirin and reimaged in 1 week.  The patient states he has a good follow-up with his PCP and is set for an appointment this coming Friday.  I encouraged the patient to attempt to move up his follow-up appointments earlier in the week to have closer follow-up for this issue.  Patient stated that he would attempt  to move the appointment up.  I advised the patient if he has any worsening pain, decrease in sensation or his foot became cold that he would need to return to the ED for further evaluation and management. I also discussed signs and symptoms of GI bleed to look out for. This patient was discussed with Dr. Jeanell Sparrow who is in agreement with plan. The patient voiced understanding with these return precautions.  The patient is in agreement with the current plan to discharge and take aspirin until he is seen by his PCP for reimaging in 1 week.  Patient is stable on discharge.        Koy, Lamp, PA-C 02/02/21 0940    Pattricia Boss, MD 02/03/21 [phone]

## 2021-02-04 ENCOUNTER — Other Ambulatory Visit: Payer: Self-pay

## 2021-02-04 ENCOUNTER — Ambulatory Visit (INDEPENDENT_AMBULATORY_CARE_PROVIDER_SITE_OTHER): Payer: Medicare HMO | Admitting: Internal Medicine

## 2021-02-04 ENCOUNTER — Encounter: Payer: Self-pay | Admitting: Internal Medicine

## 2021-02-04 VITALS — BP 132/80 | HR 84 | Resp 18 | Ht 69.0 in

## 2021-02-04 DIAGNOSIS — I825Y1 Chronic embolism and thrombosis of unspecified deep veins of right proximal lower extremity: Secondary | ICD-10-CM

## 2021-02-04 DIAGNOSIS — N179 Acute kidney failure, unspecified: Secondary | ICD-10-CM | POA: Diagnosis not present

## 2021-02-04 LAB — COMPREHENSIVE METABOLIC PANEL
ALT: 25 U/L (ref 0–53)
AST: 13 U/L (ref 0–37)
Albumin: 3.9 g/dL (ref 3.5–5.2)
Alkaline Phosphatase: 75 U/L (ref 39–117)
BUN: 17 mg/dL (ref 6–23)
CO2: 26 mEq/L (ref 19–32)
Calcium: 9.4 mg/dL (ref 8.4–10.5)
Chloride: 105 mEq/L (ref 96–112)
Creatinine, Ser: 1.07 mg/dL (ref 0.40–1.50)
GFR: 68.11 mL/min (ref >60.00)
Glucose, Bld: 121 mg/dL — ABNORMAL HIGH (ref 70–99)
Potassium: 3.5 mEq/L (ref 3.5–5.1)
Sodium: 139 mEq/L (ref 135–145)
Total Bilirubin: 0.5 mg/dL (ref 0.2–1.2)
Total Protein: 6.9 g/dL (ref 6.0–8.3)

## 2021-02-04 NOTE — Patient Instructions (Signed)
We will recheck an ultrasound of the leg in 1 week or so.  We will have you take a full dose aspirin 325 mg daily for 1-2 months.  We will recheck the kidney function today.

## 2021-02-04 NOTE — Progress Notes (Signed)
° °  Subjective:   Patient ID: Vincent [name], male    DOB: 02-May-1945, 76 y.o.   MRN: [id number]  HPI The patient is a 76 YO man coming in with wife for ER follow up. 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 Started on full dose aspirin and advised follow up US in 1-2 weeks for stability. Vascular surgery consulted by EDP and did not recommend full anticoagulation unless EDP felt pain related to the clot.   PMH, [facility], social history reviewed and updated  Review of Systems  Constitutional: Negative.   HENT: Negative.    Eyes:  Positive for visual disturbance.  Respiratory:  Negative for cough, chest tightness and shortness of breath.   Cardiovascular:  Negative for chest pain, palpitations and leg swelling.  Gastrointestinal:  Negative for abdominal distention, abdominal pain, constipation, diarrhea, nausea and vomiting.  Musculoskeletal:  Positive for back pain and myalgias.  Skin: Negative.   Neurological: Negative.   Psychiatric/Behavioral: Negative.     Objective:  Physical Exam Constitutional:      Appearance: He is well-developed.  HENT:     Head: Normocephalic and atraumatic.  Cardiovascular:     Rate and Rhythm: Normal rate and regular rhythm.  Pulmonary:     Effort: Pulmonary effort is normal. No respiratory distress.     Breath sounds: Normal breath sounds. No wheezing or rales.  Abdominal:     General: Bowel sounds are normal. There is no distension.     Palpations: Abdomen is soft.     Tenderness: There is no abdominal tenderness. There is no rebound.  Musculoskeletal:        General: Tenderness present.     Cervical back: Normal range of motion.     Comments: Pain with weightbearing to right foot, no pain to palpation in the foot or calf area, no swelling noted on exam right or left LE  Skin:    General: Skin is warm and dry.  Neurological:     Mental Status: He is alert and oriented to person, place, and time.     Coordination: Coordination abnormal.      Comments: Wheelchair due to pain with weight bearing    Vitals:   02/04/21 0938  BP: 132/80  Pulse: 84  Resp: 18  SpO2: 96%  Height: 5\' 9"  (1.753 m)    This visit occurred during the SARS-CoV-2 public health emergency.  Safety protocols were in place, including screening questions prior to the visit, additional usage of staff PPE, and extensive cleaning of exam room while observing appropriate contact time as indicated for disinfecting solutions.   Assessment & Plan:  Visit time 25 minutes in face to face communication with patient and coordination of care, additional 15 minutes spent in record review, coordination or care, ordering tests, communicating/referring to other healthcare professionals, documenting in medical records all on the same day of the visit for total time 40 minutes spent on the visit.

## 2021-02-04 NOTE — Assessment & Plan Note (Signed)
He is advised to take 325 mg aspirin daily for 2 months then resume aspirin 81 mg daily. He does not recall recently or remotely time of right lower leg swelling/pain/redness. No recent illness or covid-19 in the last 3-6 months. I do believe his leg pain is coming from lumbar issues rather than the DVT. Ordered US to follow up on stability to be done in 1 week.

## 2021-02-04 NOTE — Assessment & Plan Note (Signed)
Creatinine about 0.4 higher in ER than normal. He relates normal eating, some nausea. Checking CMP today and adjust as needed.

## 2021-02-06 ENCOUNTER — Ambulatory Visit: Payer: Medicare Other | Admitting: Internal Medicine

## 2021-02-06 ENCOUNTER — Telehealth: Payer: Self-pay

## 2021-02-06 NOTE — Telephone Encounter (Signed)
Pt calling in requesting pain med for Rt leg and Rt foot. Saw Dr. Sharlet Salina 02/04/21  Please update pt [phone]

## 2021-02-09 MED ORDER — GABAPENTIN 100 MG PO CAPS: 100.0000 mg | ORAL_CAPSULE | Freq: Two times a day (BID) | ORAL | 0 refills | Status: DC

## 2021-02-09 NOTE — Telephone Encounter (Signed)
What is he currently taking for pain?

## 2021-02-09 NOTE — Telephone Encounter (Signed)
He filled #10 hydrocodone on 02/02/21. If he still has some left he cannot be taking every 4 hours. Is he taking something else for pain? Have sent in gabapentin 100 mg to take bid prn. I would recommend follow up with PCP in 1 week.

## 2021-02-10 ENCOUNTER — Telehealth: Payer: Self-pay | Admitting: Internal Medicine

## 2021-02-10 MED ORDER — FREESTYLE LIBRE SENSOR SYSTEM MISC: 3 refills | Status: DC

## 2021-02-10 MED ORDER — FINASTERIDE 5 MG PO TABS
ORAL_TABLET | ORAL | 0 refills | Status: AC
Start: 2021-02-10 — End: ?

## 2021-02-10 MED ORDER — ROSUVASTATIN CALCIUM 20 MG PO TABS: ORAL_TABLET | ORAL | 0 refills | Status: DC

## 2021-02-10 MED ORDER — METFORMIN HCL ER 500 MG PO TB24: 500.0000 mg | ORAL_TABLET | Freq: Every day | ORAL | 0 refills | Status: DC

## 2021-02-10 MED ORDER — AMLODIPINE BESYLATE 5 MG PO TABS: ORAL_TABLET | ORAL | 0 refills | Status: DC

## 2021-02-10 MED ORDER — FREESTYLE LIBRE READER DEVI: 0 refills | Status: DC

## 2021-02-10 NOTE — Telephone Encounter (Signed)
1.Medication Requested: amLODipine (NORVASC) 5 MG tablet Continuous Blood Gluc Receiver (FREESTYLE LIBRE READER) DEVI Continuous Blood Gluc Sensor (FREESTYLE LIBRE SENSOR SYSTEM) MISC finasteride (PROSCAR) 5 MG tablet metFORMIN (GLUCOPHAGE-XR) 500 MG 24 hr tablet rosuvastatin (CRESTOR) 20 MG tablet  2. Pharmacy (Name, [facility]): [facility] [place]  3. On Med List: yes   4. Last Visit with PCP: 02-04-2021 Sharlet Salina)  5. Next visit date with PCP: 04-07-2021  Patient requesting all rx prescribed by provider refilled at [place] patient refill request may take up to 3 bds

## 2021-02-11 ENCOUNTER — Ambulatory Visit (HOSPITAL_COMMUNITY)
Admission: RE | Admit: 2021-02-11 | Discharge: 2021-02-11 | Disposition: A | Discharge status: Home / Self Care | Payer: Medicare HMO | Source: Ambulatory Visit | Attending: Internal Medicine | Admitting: Internal Medicine

## 2021-02-11 ENCOUNTER — Other Ambulatory Visit: Payer: Self-pay

## 2021-02-11 DIAGNOSIS — I825Y1 Chronic embolism and thrombosis of unspecified deep veins of right proximal lower extremity: Secondary | ICD-10-CM | POA: Diagnosis not present

## 2021-02-17 ENCOUNTER — Encounter: Payer: Self-pay | Admitting: Internal Medicine

## 2021-02-17 ENCOUNTER — Other Ambulatory Visit: Payer: Self-pay | Admitting: Internal Medicine

## 2021-02-17 ENCOUNTER — Ambulatory Visit (INDEPENDENT_AMBULATORY_CARE_PROVIDER_SITE_OTHER): Payer: Medicare HMO | Admitting: Internal Medicine

## 2021-02-17 ENCOUNTER — Other Ambulatory Visit: Payer: Self-pay

## 2021-02-17 VITALS — BP 118/70 | HR 105 | Ht 69.0 in | Wt 180.0 lb

## 2021-02-17 DIAGNOSIS — E1165 Type 2 diabetes mellitus with hyperglycemia: Secondary | ICD-10-CM | POA: Diagnosis not present

## 2021-02-17 DIAGNOSIS — E538 Deficiency of other specified B group vitamins: Secondary | ICD-10-CM | POA: Diagnosis not present

## 2021-02-17 DIAGNOSIS — E785 Hyperlipidemia, unspecified: Secondary | ICD-10-CM

## 2021-02-17 DIAGNOSIS — R972 Elevated prostate specific antigen [PSA]: Secondary | ICD-10-CM

## 2021-02-17 DIAGNOSIS — E559 Vitamin D deficiency, unspecified: Secondary | ICD-10-CM | POA: Diagnosis not present

## 2021-02-17 DIAGNOSIS — Z0001 Encounter for general adult medical examination with abnormal findings: Secondary | ICD-10-CM | POA: Diagnosis not present

## 2021-02-17 DIAGNOSIS — M5416 Radiculopathy, lumbar region: Secondary | ICD-10-CM

## 2021-02-17 LAB — BASIC METABOLIC PANEL
BUN: 18 mg/dL (ref 6–23)
CO2: 26 mEq/L (ref 19–32)
Calcium: 10.2 mg/dL (ref 8.4–10.5)
Chloride: 104 mEq/L (ref 96–112)
Creatinine, Ser: 1.15 mg/dL (ref 0.40–1.50)
GFR: 62.45 mL/min (ref >60.00)
Glucose, Bld: 139 mg/dL — ABNORMAL HIGH (ref 70–99)
Potassium: 4.1 mEq/L (ref 3.5–5.1)
Sodium: 141 mEq/L (ref 135–145)

## 2021-02-17 LAB — URINALYSIS, ROUTINE W REFLEX MICROSCOPIC
Bilirubin Urine: NEGATIVE
Hgb urine dipstick: NEGATIVE
Leukocytes,Ua: NEGATIVE
Nitrite: NEGATIVE
RBC / HPF: NONE SEEN (ref 0–?)
Specific Gravity, Urine: >=1.03 — AB (ref 1.000–1.030)
Total Protein, Urine: 30 — AB
Urine Glucose: NEGATIVE
Urobilinogen, UA: 0.2 (ref 0.0–1.0)
pH: 5.5 (ref 5.0–8.0)

## 2021-02-17 LAB — CBC WITH DIFFERENTIAL/PLATELET
Basophils Absolute: 0 10*3/uL (ref 0.0–0.1)
Basophils Relative: 0.5 % (ref 0.0–3.0)
Eosinophils Absolute: 0.1 10*3/uL (ref 0.0–0.7)
Eosinophils Relative: 2 % (ref 0.0–5.0)
HCT: 43.9 % (ref 39.0–52.0)
Hemoglobin: 13.9 g/dL (ref 13.0–17.0)
Lymphocytes Relative: 24.7 % (ref 12.0–46.0)
Lymphs Abs: 1.5 10*3/uL (ref 0.7–4.0)
MCHC: 31.7 g/dL (ref 30.0–36.0)
MCV: 86 fl (ref 78.0–100.0)
Monocytes Absolute: 0.5 10*3/uL (ref 0.1–1.0)
Monocytes Relative: 9.3 % (ref 3.0–12.0)
Neutro Abs: 3.7 10*3/uL (ref 1.4–7.7)
Neutrophils Relative %: 63.5 % (ref 43.0–77.0)
Platelets: 217 10*3/uL (ref 150.0–400.0)
RBC: 5.11 Mil/uL (ref 4.22–5.81)
RDW: 14.4 % (ref 11.5–15.5)
WBC: 5.9 10*3/uL (ref 4.0–10.5)

## 2021-02-17 LAB — MICROALBUMIN / CREATININE URINE RATIO
Creatinine,U: 387.2 mg/dL
Microalb Creat Ratio: 2.1 mg/g (ref 0.0–30.0)
Microalb, Ur: 8.1 mg/dL — ABNORMAL HIGH (ref 0.0–1.9)

## 2021-02-17 LAB — VITAMIN B12: Vitamin B-12: 725 pg/mL (ref 211–911)

## 2021-02-17 LAB — LIPID PANEL
Cholesterol: 111 mg/dL (ref 0–200)
HDL: 54.9 mg/dL (ref >39.00)
LDL Cholesterol: 36 mg/dL (ref 0–99)
NonHDL: 56.05
Total CHOL/HDL Ratio: 2
Triglycerides: 99 mg/dL (ref 0.0–149.0)
VLDL: 19.8 mg/dL (ref 0.0–40.0)

## 2021-02-17 LAB — HEPATIC FUNCTION PANEL
ALT: 23 U/L (ref 0–53)
AST: 16 U/L (ref 0–37)
Albumin: 4.4 g/dL (ref 3.5–5.2)
Alkaline Phosphatase: 89 U/L (ref 39–117)
Bilirubin, Direct: 0.1 mg/dL (ref 0.0–0.3)
Total Bilirubin: 0.5 mg/dL (ref 0.2–1.2)
Total Protein: 7.4 g/dL (ref 6.0–8.3)

## 2021-02-17 LAB — TSH: TSH: 1.54 u[IU]/mL (ref 0.35–5.50)

## 2021-02-17 LAB — VITAMIN D 25 HYDROXY (VIT D DEFICIENCY, FRACTURES): VITD: 68.13 ng/mL (ref 30.00–100.00)

## 2021-02-17 LAB — HEMOGLOBIN A1C: Hgb A1c MFr Bld: 7.9 % — ABNORMAL HIGH (ref 4.6–6.5)

## 2021-02-17 LAB — PSA: PSA: 10.51 ng/mL — ABNORMAL HIGH (ref 0.10–4.00)

## 2021-02-17 MED ORDER — KETOROLAC TROMETHAMINE 30 MG/ML IJ SOLN
30.0000 mg | Freq: Once | INTRAMUSCULAR | Status: AC
  Administered 2021-02-17: 30 mg via INTRAMUSCULAR

## 2021-02-17 MED ORDER — METFORMIN HCL ER 500 MG PO TB24
1000.0000 mg | ORAL_TABLET | Freq: Every day | ORAL | 3 refills | Status: DC
Start: 2021-02-17 — End: 2021-07-10

## 2021-02-17 MED ORDER — HYDROCODONE-ACETAMINOPHEN 10-325 MG PO TABS
1.0000 | ORAL_TABLET | Freq: Four times a day (QID) | ORAL | 0 refills | Status: AC | PRN
Start: 2021-02-17 — End: 2021-02-22

## 2021-02-17 NOTE — Patient Instructions (Signed)
You had the pain shot today (toradol 30 mg)  Please take all new medication as prescribed - the hydrocodone 10/325 mg per day  You will be contacted regarding the referral for: MRI (urgent) and Dr Ellene Route (for Jan 27)  Please continue all other medications as before, and refills have been done if requested.  Please have the pharmacy call with any other refills you may need.  Please continue your efforts at being more active, low cholesterol diet, and weight control.  You are otherwise up to date with prevention measures today.  Please keep your appointments with your specialists as you may have planned - Dr Gershon Crane for your eyes  Please go to the LAB at the blood drawing area for the tests to be done  You will be contacted by phone if any changes need to be made immediately.  Otherwise, you will receive a letter about your results with an explanation, but please check with MyChart first.  Please remember to sign up for MyChart if you have not done so, as this will be important to you in the future with finding out test results, communicating by private email, and scheduling acute appointments online when needed.  Please make an Appointment to return in 6 months, or sooner if needed

## 2021-02-17 NOTE — Progress Notes (Signed)
Patient ID: Vincent [name], male   DOB: 1945/04/01, 76 y.o.   MRN: [id number]         Chief Complaint:: wellness exam and Follow-up  Right lumbar radiculopathy       HPI:  Vincent [name] is a 76 y.o. male here for wellness exam; decliens optho referral as he plans to follow up on his own; declines covid booster, shingirx, tdap, o/w up to date                        Also has 1 wk onset severe right lumbar pain with radiation to the RLE with mild weakness but no falls.  Has been seen recntly in ED and per Dr Sharlet Salina, unfortunately pain now 10/10.  Pt has appt with Dr Elsner jan 27 but needs MRI prior.  No fals, fever, trauma.  Pain severe in all positions except some improved with lying down flat, worse to sit or stand or walk.  Nothing else makes better or worse.  Has hx of prior Back surgury.  Pt denies chest pain, increased sob or doe, wheezing, orthopnea, PND, increased LE swelling, palpitations, dizziness or syncope.   Pt denies polydipsia, polyuria, or new focal neuro s/s.   Pt denies fever, wt loss, night sweats, loss of appetite, or other constitutional symptoms    Wt Readings from Last 3 Encounters:  02/17/21 180 lb (81.6 kg)  02/01/21 180 lb (81.6 kg)  12/31/20 175 lb (79.4 kg)   BP Readings from Last 3 Encounters:  02/17/21 118/70  02/04/21 132/80  02/02/21 (!) 134/99   Immunization History  Administered Date(s) Administered   Influenza, High Dose Seasonal PF 10/14/2018   Influenza,inj,Quad PF,6+ Mos 12/18/2019   Influenza-Unspecified 12/12/2009, 10/29/2020   Moderna SARS-COV2 Booster Vaccination 12/07/2019   Moderna Sars-Covid-2 Vaccination 03/20/2019, 04/18/2019   Pneumococcal Conjugate-13 02/13/2004, 08/01/2012   Pneumococcal Polysaccharide-23 09/10/2014   Td 06/15/2010   Zoster, Live 06/11/2009   There are no preventive care reminders to display for this patient.     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   Chronic LBP 02/08/2011   DDD (degenerative disc disease), cervical 04/11/2011   Diabetes mellitus type 2, diet-controlled ([place])    on meds   Diverticulosis    Elevated PSA 02/08/2011   GERD (gastroesophageal reflux disease) 63/78/5885   with certain foods-on meds   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/2011   pt unaware   History of colon polyps    History of kidney stones    History of prostatitis 04/11/2011   Hyperlipidemia    on meds   Hypertension    on meds   Lumbar disc disease 02/06/2011   Vitamin D deficiency    Wears partial dentures    upper and lower   Past Surgical History:  Procedure Laterality Date   BACK SURGERY     x 3  L4-L5   COLONOSCOPY  2007   Dr Wynetta Emery   COLONOSCOPY  2017   SA-MAC-suprep(good)-SSP x 1   CYST EXCISION     POLYPECTOMY     SPP x 1   THULIUM LASER TURP (TRANSURETHRAL RESECTION OF PROSTATE) N/A 10/27/2018   Procedure:  THULIUM LASER TURP (TRANSURETHRAL RESECTION OF PROSTATE);  Surgeon: Festus Aloe, MD;  Location: [facility];  Service: Urology;  Laterality: N/A;   WISDOM TOOTH EXTRACTION      reports that he quit smoking about 57 years ago. His smoking use included cigarettes. He has never used smokeless tobacco. He reports that he does not drink alcohol and does not use drugs. family history includes Blindness in an other family member; Diabetes in an other family member; Heart disease in an other family member; Hypertension in an other family member; Mental illness in an other family member; Sudden death in an other family member. No Known Allergies Current Outpatient Medications on File Prior to Visit  Medication Sig Dispense Refill   amLODipine (NORVASC) 5 MG tablet TAKE 1 TABLET BY MOUTH  DAILY 90 tablet 0    aspirin 81 MG EC tablet Take 1 tablet (81 mg total) by mouth daily. Swallow whole. (Patient taking differently: Take 324 mg by mouth daily. Swallow whole.) 30 tablet 12   Continuous Blood Gluc Receiver (FREESTYLE LIBRE READER) DEVI Use as directed twice daily E11.9 1 each 0   Continuous Blood Gluc Sensor ([place]) MISC Use as directed twice daily E11.9 6 each 3   finasteride (PROSCAR) 5 MG tablet TK 1 T PO QHS 90 tablet 0   gabapentin (NEURONTIN) 100 MG capsule Take 1 capsule (100 mg total) by mouth 2 (two) times daily. 60 capsule 0   Multiple Vitamin (MULTIVITAMIN PO) Take 1 tablet by mouth daily at 6 (six) AM.     rosuvastatin (CRESTOR) 20 MG tablet TAKE 1 TABLET BY MOUTH  DAILY 90 tablet 0   tamsulosin (FLOMAX) 0.4 MG CAPS capsule Take 0.4 mg by mouth every evening.   8   No current facility-administered medications on file prior to visit.        ROS:  All others reviewed and negative.  Objective        PE:  BP 118/70 (BP Location: Left Arm, Patient Position: Sitting, Cuff Size: Large)    Pulse (!) 105    Ht _0  (1.753 m)    Wt 180 lb (81.6 kg)    SpO2 96%    BMI 26.58 kg/m                 Constitutional: Pt appears in NAD               HENT: Head: NCAT.                Right Ear: External ear normal.                 Left Ear: External ear normal.                Eyes: . Pupils are equal, round, and reactive to light. Conjunctivae and EOM are normal               Nose: without d/c or deformity               Neck: Neck supple. Gross normal ROM               Cardiovascular: Normal rate and regular rhythm.                 Pulmonary/Chest: Effort normal and breath sounds without rales or wheezing.                Abd:  Soft, NT, ND, +  BS, no organomegaly               Neurological: Pt is alert. At baseline orientation, motor with 4/5 RLE motor and decresaed sensation distally to LT               Skin: Skin is warm. No rashes, no other new lesions, LE edema - none                Psychiatric: Pt behavior is normal without agitation   Micro: none  Cardiac tracings I have personally interpreted today:  none  Pertinent Radiological findings (summarize): none   Lab Results  Component Value Date   WBC 5.9 02/17/2021   HGB 13.9 02/17/2021   HCT 43.9 02/17/2021   PLT 217.0 02/17/2021   GLUCOSE 139 (H) 02/17/2021   CHOL 111 02/17/2021   TRIG 99.0 02/17/2021   HDL 54.90 02/17/2021   LDLCALC 36 02/17/2021   ALT 23 02/17/2021   AST 16 02/17/2021   NA 141 02/17/2021   K 4.1 02/17/2021   CL 104 02/17/2021   CREATININE 1.15 02/17/2021   BUN 18 02/17/2021   CO2 26 02/17/2021   TSH 1.54 02/17/2021   PSA 10.51 (H) 02/17/2021   HGBA1C 7.9 (H) 02/17/2021   MICROALBUR 8.1 (H) 02/17/2021   Assessment/Plan:  Vincent [name] is a 76 y.o. Black or African American [2] male with  has a past medical history of Allergic rhinitis, cause unspecified (02/08/2011), Anemia, unspecified (04/11/2011), Arthritis, Blindness (02/06/2011), BPH (benign prostatic hypertrophy) (02/08/2011), Cataract, Chronic LBP (02/08/2011), DDD (degenerative disc disease), cervical (04/11/2011), Diabetes mellitus type 2, diet-controlled ([place]), Diverticulosis, Elevated PSA (02/08/2011), GERD (gastroesophageal reflux disease) (02/08/2011), Hepatitis B antibody positive (04/11/2011), History of colon polyps, History of kidney stones, History of prostatitis (04/11/2011), Hyperlipidemia, Hypertension, Lumbar disc disease (02/06/2011), Vitamin D deficiency, and Wears partial dentures.  Encounter for well adult exam with abnormal findings Age and sex appropriate education and counseling updated with regular exercise and diet Referrals for preventative services  - pt has regular follow up with ophthalmology, plans to f/u soon Immunizations addressed - declines covid booster, shingrix, tdap Smoking counseling  - none needed Evidence for depression or other mood disorder - none significant Most  recent labs reviewed. I have personally reviewed and have noted: 1) the patient's medical and social history 2) The patient's current medications and supplements 3) The patient's height, weight, and BMI have been recorded in the chart   Diabetes [facility]) Lab Results  Component Value Date   HGBA1C 7.9 (H) 02/17/2021   Mild uncontroled, pt to increase current medical treatment metformin ER 500 to 2 tab qam  Elevated PSA Lab Results  Component Value Date   PSA 10.51 (H) 02/17/2021   PSA 8.64 (H) 04/22/2020   PSA 6.94 (H) 02/07/2019   Mld worsening overall, pt plans to f/u urology every 6 mo, s/p prostate bx x 3  Hyperlipidemia Lab Results  Component Value Date   LDLCALC 36 02/17/2021   Stable, pt to continue current statin crestor 20   Right lumbar radiculopathy With recent severe worsening now with neuro change, needs stat MRI LS spine, pain control, and f/u with Dr Ellene Route asap or jan 27  Vitamin D deficiency Last vitamin D Lab Results  Component Value Date   VD25OH 68.13 02/17/2021   Stable, cont oral replacement  Followup: Return in about 6 months (around 08/17/2021).  [name], MD 02/20/2021 7:19 AM [facility]

## 2021-02-19 ENCOUNTER — Telehealth: Payer: Self-pay

## 2021-02-19 NOTE — Telephone Encounter (Signed)
Pt calling in stating that he was given: HYDROcodone-acetaminophen (NORCO) 10-325 MG tablet  Pt states that it is not helping and was requesting something stronger.  Pharmacy: [facility] [place], [place]  LOV 02/17/21

## 2021-02-19 NOTE — Telephone Encounter (Signed)
Very sorry, I would not feel comfortable with other narcotic than this

## 2021-02-20 ENCOUNTER — Encounter: Payer: Self-pay | Admitting: Internal Medicine

## 2021-02-20 MED ORDER — ROSUVASTATIN CALCIUM 20 MG PO TABS: ORAL_TABLET | ORAL | 0 refills | Status: DC

## 2021-02-20 NOTE — Assessment & Plan Note (Signed)
Lab Results  Component Value Date   LDLCALC 36 02/17/2021   Stable, pt to continue current statin crestor 20

## 2021-02-20 NOTE — Assessment & Plan Note (Signed)
Lab Results  Component Value Date   HGBA1C 7.9 (H) 02/17/2021   Mild uncontroled, pt to increase current medical treatment metformin ER 500 to 2 tab qam

## 2021-02-20 NOTE — Telephone Encounter (Signed)
Patient states pharmacy informed him they can not fill rosuvastatin (CRESTOR) 20 MG tablet  due to no refills  Informed patient chart shows 90 day supply of [facility] on [facility]10-[facility]21, patient states he never received medication

## 2021-02-20 NOTE — Assessment & Plan Note (Signed)
Last vitamin D Lab Results  Component Value Date   VD25OH 68.13 02/17/2021   Stable, cont oral replacement

## 2021-02-20 NOTE — Assessment & Plan Note (Signed)
With recent severe worsening now with neuro change, needs stat MRI LS spine, pain control, and f/u with Dr Ellene Route asap or jan 27

## 2021-02-20 NOTE — Assessment & Plan Note (Signed)
Age and sex appropriate education and counseling updated with regular exercise and diet Referrals for preventative services  - pt has regular follow up with ophthalmology, plans to f/u soon Immunizations addressed - declines covid booster, shingrix, tdap Smoking counseling  - none needed Evidence for depression or other mood disorder - none significant Most recent labs reviewed. I have personally reviewed and have noted: 1) the patient's medical and social history 2) The patient's current medications and supplements 3) The patient's height, weight, and BMI have been recorded in the chart

## 2021-02-20 NOTE — Assessment & Plan Note (Signed)
Lab Results  Component Value Date   PSA 10.51 (H) 02/17/2021   PSA 8.64 (H) 04/22/2020   PSA 6.94 (H) 02/07/2019   Mld worsening overall, pt plans to f/u urology every 6 mo, s/p prostate bx x 3

## 2021-02-20 NOTE — Telephone Encounter (Signed)
Patient advised.

## 2021-02-20 NOTE — Telephone Encounter (Signed)
Medication has been resent

## 2021-02-20 NOTE — Addendum Note (Signed)
Addended by: Terence Lux A on: 02/20/2021 12:36 PM   Modules accepted: Orders

## 2021-02-23 ENCOUNTER — Ambulatory Visit
Admission: RE | Admit: 2021-02-23 | Discharge: 2021-02-23 | Disposition: A | Discharge status: Home / Self Care | Payer: Medicare HMO | Source: Ambulatory Visit | Attending: Internal Medicine | Admitting: Internal Medicine

## 2021-02-23 ENCOUNTER — Other Ambulatory Visit: Payer: Self-pay

## 2021-02-23 DIAGNOSIS — M5416 Radiculopathy, lumbar region: Secondary | ICD-10-CM | POA: Diagnosis not present

## 2021-02-23 DIAGNOSIS — M48061 Spinal stenosis, lumbar region without neurogenic claudication: Secondary | ICD-10-CM | POA: Diagnosis not present

## 2021-02-23 DIAGNOSIS — M2578 Osteophyte, vertebrae: Secondary | ICD-10-CM | POA: Diagnosis not present

## 2021-02-24 ENCOUNTER — Other Ambulatory Visit: Payer: Self-pay | Admitting: Internal Medicine

## 2021-02-24 DIAGNOSIS — R9341 Abnormal radiologic findings on diagnostic imaging of renal pelvis, ureter, or bladder: Secondary | ICD-10-CM

## 2021-02-26 ENCOUNTER — Telehealth: Payer: Self-pay | Admitting: Internal Medicine

## 2021-02-26 NOTE — Telephone Encounter (Signed)
Patient calling in  Patient wanting to make [place] aware that we will be receiving a fax from [facility] regarding information related to his Glucose Monitor

## 2021-02-26 NOTE — Telephone Encounter (Signed)
Office notes and labs faxed.

## 2021-02-26 NOTE — Telephone Encounter (Signed)
[facility] has called and states the pt is scheduled to go their office at 9:30 am tomorrow. However, they have no notes or previous labs.   Please send over ASAP.  Fax #- [phone]

## 2021-02-27 DIAGNOSIS — M5127 Other intervertebral disc displacement, lumbosacral region: Secondary | ICD-10-CM | POA: Diagnosis not present

## 2021-02-27 DIAGNOSIS — N401 Enlarged prostate with lower urinary tract symptoms: Secondary | ICD-10-CM | POA: Diagnosis not present

## 2021-02-27 DIAGNOSIS — R3912 Poor urinary stream: Secondary | ICD-10-CM | POA: Diagnosis not present

## 2021-02-27 DIAGNOSIS — R972 Elevated prostate specific antigen [PSA]: Secondary | ICD-10-CM | POA: Diagnosis not present

## 2021-03-02 ENCOUNTER — Telehealth: Payer: Self-pay | Admitting: Internal Medicine

## 2021-03-02 DIAGNOSIS — M5416 Radiculopathy, lumbar region: Secondary | ICD-10-CM

## 2021-03-02 DIAGNOSIS — R269 Unspecified abnormalities of gait and mobility: Secondary | ICD-10-CM

## 2021-03-02 NOTE — Telephone Encounter (Signed)
Patient requesting a rx for a rollator walker w/ seat due to pain in back and legs   Pt requesting rx sent to [name]  [address] [place], [place], [place] [address]

## 2021-03-02 NOTE — Telephone Encounter (Signed)
Done hardcopy to cma 

## 2021-03-06 DIAGNOSIS — N3289 Other specified disorders of bladder: Secondary | ICD-10-CM | POA: Diagnosis not present

## 2021-03-06 DIAGNOSIS — N2889 Other specified disorders of kidney and ureter: Secondary | ICD-10-CM | POA: Diagnosis not present

## 2021-03-06 DIAGNOSIS — K4020 Bilateral inguinal hernia, without obstruction or gangrene, not specified as recurrent: Secondary | ICD-10-CM | POA: Diagnosis not present

## 2021-03-06 DIAGNOSIS — N281 Cyst of kidney, acquired: Secondary | ICD-10-CM | POA: Diagnosis not present

## 2021-03-06 DIAGNOSIS — R9389 Abnormal findings on diagnostic imaging of other specified body structures: Secondary | ICD-10-CM | POA: Diagnosis not present

## 2021-03-09 DIAGNOSIS — M5127 Other intervertebral disc displacement, lumbosacral region: Secondary | ICD-10-CM | POA: Diagnosis not present

## 2021-03-09 DIAGNOSIS — M5116 Intervertebral disc disorders with radiculopathy, lumbar region: Secondary | ICD-10-CM | POA: Diagnosis not present

## 2021-03-18 ENCOUNTER — Telehealth: Payer: Self-pay | Admitting: Internal Medicine

## 2021-03-18 MED ORDER — PRODIGY SAFETY LANCETS 26G MISC: 1.0000 | Freq: Two times a day (BID) | 3 refills | Status: DC

## 2021-03-18 MED ORDER — PRODIGY VOICE BLOOD GLUCOSE W/DEVICE KIT: 1.0000 | PACK | Freq: Two times a day (BID) | 0 refills | Status: AC

## 2021-03-18 MED ORDER — GLUCOSE BLOOD VI STRP: ORAL_STRIP | 3 refills | Status: DC

## 2021-03-18 NOTE — Telephone Encounter (Signed)
Prescriptions sent and patient notified via voicemail

## 2021-03-18 NOTE — Telephone Encounter (Signed)
Patient calling in  Patient says his insurance no longer covers the [employer] meters or test supplies.. need new rx for Prodigy style meter & testing supplies sent to [place], [place] Phone:  [phone]  Fax:  [phone]-

## 2021-03-30 NOTE — Telephone Encounter (Signed)
Pt is calling stating that [employer] hasn't received the order from here for Prodigy style meter & testing supplies.  Pt states [employer] sent a order via fax on Friday 03/27/21. This is needed for pt to be able to get this.   Pt CB [phone] pt didn't know a number to [facility].

## 2021-03-31 ENCOUNTER — Telehealth: Payer: Self-pay

## 2021-03-31 NOTE — Telephone Encounter (Signed)
Spoke with [place] from [place]; PA was approved for meter. New PA appealed for test strips. Reference # [id number]

## 2021-03-31 NOTE — Telephone Encounter (Signed)
Prodigy meter has been approved (please refer to encounter from 2/28). Waiting for approval for test strips. Patient notified.

## 2021-03-31 NOTE — Telephone Encounter (Signed)
PA has been started for talking Meter.  Key: [facility]

## 2021-04-07 ENCOUNTER — Ambulatory Visit (INDEPENDENT_AMBULATORY_CARE_PROVIDER_SITE_OTHER): Payer: Medicare HMO | Admitting: Internal Medicine

## 2021-04-07 ENCOUNTER — Encounter: Payer: Self-pay | Admitting: Internal Medicine

## 2021-04-07 ENCOUNTER — Other Ambulatory Visit: Payer: Self-pay

## 2021-04-07 VITALS — BP 110/66 | HR 106 | Temp 98.2°F | Ht 69.0 in | Wt 175.0 lb

## 2021-04-07 DIAGNOSIS — E1165 Type 2 diabetes mellitus with hyperglycemia: Secondary | ICD-10-CM

## 2021-04-07 DIAGNOSIS — M5416 Radiculopathy, lumbar region: Secondary | ICD-10-CM

## 2021-04-07 DIAGNOSIS — I499 Cardiac arrhythmia, unspecified: Secondary | ICD-10-CM

## 2021-04-07 DIAGNOSIS — E559 Vitamin D deficiency, unspecified: Secondary | ICD-10-CM | POA: Diagnosis not present

## 2021-04-07 LAB — POCT GLYCOSYLATED HEMOGLOBIN (HGB A1C): Hemoglobin A1C: 7.1 % — AB (ref 4.0–5.6)

## 2021-04-07 NOTE — Progress Notes (Signed)
Patient ID: Vincent [name], male   DOB: 1945-03-18, 76 y.o.   MRN: [id number]        Chief Complaint: follow up HTN, HLD and DM and ? Arrythmia s/p recent lumbar surgury       HPI:  Vincent [name] is a 76 y.o. male here s/p recent lumbar surgury and doing very very well, denies any pain, ambulating well and no LE symptoms, no gait issue or fallling.  Did have afib per surgical team prior to surgury per pt, and has been referred to cardiology, not yet had chance to be seen.  Pt denies chest pain, increased sob or doe, wheezing, orthopnea, PND, increased LE swelling, palpitations, dizziness or syncope.   Pt denies polydipsia, polyuria, or new focal neuro s/s.   Denies urinary symptoms such as dysuria, frequency, urgency, flank pain, hematuria or n/v, fever, chills - has urology visit no planned for mar 27.         Wt Readings from Last 3 Encounters:  04/07/21 175 lb (79.4 kg)  02/17/21 180 lb (81.6 kg)  02/01/21 180 lb (81.6 kg)   BP Readings from Last 3 Encounters:  04/07/21 110/66  02/17/21 118/70  02/04/21 132/80         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, blind since 41 or 67 yrs old   Chronic LBP 02/08/2011   DDD (degenerative disc disease), cervical 04/11/2011   Diabetes mellitus type 2, diet-controlled ([place])    on meds   Diverticulosis    Elevated PSA 02/08/2011   GERD (gastroesophageal reflux disease) 73/42/8768   with certain foods-on meds   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/2011   pt unaware   History of colon polyps    History of kidney stones    History of prostatitis 04/11/2011   Hyperlipidemia    on meds   Hypertension    on meds   Lumbar disc disease 02/06/2011   Vitamin D deficiency    Wears partial dentures    upper and  lower   Past Surgical History:  Procedure Laterality Date   BACK SURGERY     x 3  L4-L5   COLONOSCOPY  2007   Dr Wynetta Emery   COLONOSCOPY  2017   SA-MAC-suprep(good)-SSP x 1   CYST EXCISION     POLYPECTOMY     SPP x 1   THULIUM LASER TURP (TRANSURETHRAL RESECTION OF PROSTATE) N/A 10/27/2018   Procedure: THULIUM LASER TURP (TRANSURETHRAL RESECTION OF PROSTATE);  Surgeon: Festus Aloe, MD;  Location: [facility];  Service: Urology;  Laterality: N/A;   WISDOM TOOTH EXTRACTION      reports that he quit smoking about 57 years ago. His smoking use included cigarettes. He has never used smokeless tobacco. He reports that he does not drink alcohol and does not use drugs. family history includes Blindness in an other family member; Diabetes in an other family member; Heart disease in an other family member; Hypertension in an other family member; Mental illness in an other family member; Sudden death in an other family member. No Known Allergies Current Outpatient Medications on File Prior to Visit  Medication Sig Dispense Refill   amLODipine (NORVASC) 5 MG tablet TAKE 1 TABLET BY MOUTH  DAILY 90 tablet 0   aspirin 81 MG EC tablet Take 1 tablet (81 mg total) by mouth daily. Swallow whole. (Patient taking differently: Take 324 mg by mouth daily. Swallow whole.) 30 tablet 12   Blood Glucose Monitoring Suppl (PRODIGY VOICE BLOOD GLUCOSE) w/Device KIT 1 each by Does not apply route 2 (two) times daily. Use as directed twice daily E11.9 1 kit 0   Continuous Blood Gluc Receiver (FREESTYLE LIBRE READER) DEVI Use as directed twice daily E11.9 1 each 0   Continuous Blood Gluc Sensor ([place]) MISC Use as directed twice daily E11.9 6 each 3   finasteride (PROSCAR) 5 MG tablet TK 1 T PO QHS 90 tablet 0   gabapentin (NEURONTIN) 100 MG capsule Take 1 capsule (100 mg total) by mouth 2 (two) times daily. 60 capsule 0   glucose blood test strip Use as instructed twice daily  E11.9 300 each 3   metFORMIN (GLUCOPHAGE-XR) 500 MG 24 hr tablet Take 2 tablets (1,000 mg total) by mouth daily with breakfast. 180 tablet 3   Multiple Vitamin (MULTIVITAMIN PO) Take 1 tablet by mouth daily at 6 (six) AM.     Prodigy Safety Lancets 26G MISC 1 each by Does not apply route 2 (two) times daily. Use as directed twice daily E11.9 300 each 3   rosuvastatin (CRESTOR) 20 MG tablet TAKE 1 TABLET BY MOUTH  DAILY 90 tablet 0   tamsulosin (FLOMAX) 0.4 MG CAPS capsule Take 0.4 mg by mouth every evening.   8   No current facility-administered medications on file prior to visit.        ROS:  All others reviewed and negative.  Objective        PE:  BP 110/66 (BP Location: Right Arm, Patient Position: Sitting, Cuff Size: Large)    Pulse (!) 106    Temp 98.2 F (36.8 C) (Oral)    Ht _0  (1.753 m)    Wt 175 lb (79.4 kg) Comment: patient reported   SpO2 96%    BMI 25.84 kg/m                 Constitutional: Pt appears in NAD               HENT: Head: NCAT.                Right Ear: External ear normal.                 Left Ear: External ear normal.                Eyes: . Pupils are equal, round, and reactive to light. Conjunctivae and EOM are normal               Nose: without d/c or deformity               Neck: Neck supple. Gross normal ROM               Cardiovascular: Normal rate with ectopy vs IRIR - for ECG as below               Pulmonary/Chest: Effort normal and breath sounds without rales or wheezing.                Abd:  Soft, NT, ND, + BS, no organomegaly               Neurological: Pt is alert. At baseline orientation, motor grossly  intact               Skin: Skin is warm. No rashes, no other new lesions, LE edema - none               Psychiatric: Pt behavior is normal without agitation   Micro: none  Cardiac tracings I have personally interpreted today:  ECG - SR with PAC 83  Pertinent Radiological findings (summarize): none   Lab Results  Component Value Date   WBC  5.9 02/17/2021   HGB 13.9 02/17/2021   HCT 43.9 02/17/2021   PLT 217.0 02/17/2021   GLUCOSE 139 (H) 02/17/2021   CHOL 111 02/17/2021   TRIG 99.0 02/17/2021   HDL 54.90 02/17/2021   LDLCALC 36 02/17/2021   ALT 23 02/17/2021   AST 16 02/17/2021   NA 141 02/17/2021   K 4.1 02/17/2021   CL 104 02/17/2021   CREATININE 1.15 02/17/2021   BUN 18 02/17/2021   CO2 26 02/17/2021   TSH 1.54 02/17/2021   PSA 10.51 (H) 02/17/2021   HGBA1C 7.1 (A) 04/07/2021   MICROALBUR 8.1 (H) 02/17/2021   Hemoglobin A1C 4.0 - 5.6 % 7.1 Abnormal   7.9 High  R, CM  7.1 High  R, CM  6.7 Abnormal   7.8 High  R, CM  7.1 High  R, CM  7.2 High  R,    Assessment/Plan:  Vincent [name] is a 76 y.o. Black or African American [2] male with  has a past medical history of Allergic rhinitis, cause unspecified (02/08/2011), Anemia, unspecified (04/11/2011), Arthritis, Blindness (02/06/2011), BPH (benign prostatic hypertrophy) (02/08/2011), Cataract, Chronic LBP (02/08/2011), DDD (degenerative disc disease), cervical (04/11/2011), Diabetes mellitus type 2, diet-controlled ([place]), Diverticulosis, Elevated PSA (02/08/2011), GERD (gastroesophageal reflux disease) (02/08/2011), Hepatitis B antibody positive (04/11/2011), History of colon polyps, History of kidney stones, History of prostatitis (04/11/2011), Hyperlipidemia, Hypertension, Lumbar disc disease (02/06/2011), Vitamin D deficiency, and Wears partial dentures.  Vitamin D deficiency Last vitamin D Lab Results  Component Value Date   VD25OH 68.13 02/17/2021   Stable, cont oral replacement   Right lumbar radiculopathy Resolved post surgury,  to f/u any worsening symptoms or concerns  Irregular heart beat Current ECG with ectopy only, asympt, for but to f/u with cardiology as pt was told he needed to do based on possible afib perioperative recent  Diabetes [facility]) Lab Results  Component Value Date   HGBA1C 7.1 (A) 04/07/2021   Very mild uncontrolled, pt to  continue current medical treatment metformin ER 500 x 2 qd no change for now  Followup: Return in about 6 months (around 10/08/2021).  [name], MD 04/12/2021 8:18 PM [facility]

## 2021-04-07 NOTE — Patient Instructions (Signed)
Your A1c and EKG were done today ? ?Please continue all other medications as before, and refills have been done if requested. ? ?Please have the pharmacy call with any other refills you may need. ? ?Please continue your efforts at being more active, low cholesterol diet, and weight control. ? ?Please keep your appointments with your specialists as you may have planned ? ?You will be contacted regarding the referral for: Cardiology ? ?Please make an Appointment to return in 6 months, or sooner if needed ?

## 2021-04-07 NOTE — Assessment & Plan Note (Signed)
Last vitamin D ?Lab Results  ?Component Value Date  ? VD25OH 68.13 02/17/2021  ? ?Stable, cont oral replacement ? ?

## 2021-04-08 ENCOUNTER — Telehealth: Payer: Self-pay

## 2021-04-08 NOTE — Telephone Encounter (Signed)
NOTES SCANNED TO REFERRAL 

## 2021-04-10 NOTE — Progress Notes (Unsigned)
Cardiology Office Note   Date:  04/10/2021   ID:  Vincent [name], DOB [birth year], MRN [id number]  PCP:  Biagio Borg, MD  Cardiologist:   Brit Wernette Martinique, MD   No chief complaint on file.     History of Present Illness: Vincent [name] is a 76 y.o. male who is seen at the request of Dr Jenny Reichmann for evaluation of palpitations. He has a history of DM, HTN, HLD and blindness.     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, blind since 33 or 80 yrs old   Chronic LBP 02/08/2011   DDD (degenerative disc disease), cervical 04/11/2011   Diabetes mellitus type 2, diet-controlled ([place])    on meds   Diverticulosis    Elevated PSA 02/08/2011   GERD (gastroesophageal reflux disease) 67/89/3810   with certain foods-on meds   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/2011   pt unaware   History of colon polyps    History of kidney stones    History of prostatitis 04/11/2011   Hyperlipidemia    on meds   Hypertension    on meds   Lumbar disc disease 02/06/2011   Vitamin D deficiency    Wears partial dentures    upper and lower    Past Surgical History:  Procedure Laterality Date   BACK SURGERY     x 3  L4-L5   COLONOSCOPY  2007   Dr Wynetta Emery   COLONOSCOPY  2017   SA-MAC-suprep(good)-SSP x 1   CYST EXCISION     POLYPECTOMY     SPP x 1   THULIUM LASER TURP (TRANSURETHRAL RESECTION OF PROSTATE) N/A 10/27/2018   Procedure: THULIUM LASER TURP (TRANSURETHRAL RESECTION OF PROSTATE);  Surgeon: Festus Aloe, MD;  Location: [facility];  Service: Urology;  Laterality: N/A;   WISDOM TOOTH EXTRACTION       Current Outpatient Medications  Medication Sig Dispense Refill   amLODipine (NORVASC) 5 MG tablet TAKE 1 TABLET BY MOUTH  DAILY 90 tablet 0    aspirin 81 MG EC tablet Take 1 tablet (81 mg total) by mouth daily. Swallow whole. (Patient taking differently: Take 324 mg by mouth daily. Swallow whole.) 30 tablet 12   Blood Glucose Monitoring Suppl (PRODIGY VOICE BLOOD GLUCOSE) w/Device KIT 1 each by Does not apply route 2 (two) times daily. Use as directed twice daily E11.9 1 kit 0   Continuous Blood Gluc Receiver (FREESTYLE LIBRE READER) DEVI Use as directed twice daily E11.9 1 each 0   Continuous Blood Gluc Sensor ([place]) MISC Use as directed twice daily E11.9 6 each 3   finasteride (PROSCAR) 5 MG tablet TK 1 T PO QHS 90 tablet 0   gabapentin (NEURONTIN) 100 MG capsule Take 1 capsule (100 mg total) by mouth 2 (two) times daily. 60 capsule 0   glucose blood test strip Use as instructed twice daily E11.9 300 each 3   metFORMIN (GLUCOPHAGE-XR) 500 MG 24 hr tablet Take 2 tablets (1,000 mg total) by mouth daily with breakfast. 180 tablet 3   Multiple Vitamin (MULTIVITAMIN PO) Take 1 tablet by mouth daily at 6 (six) AM.     Prodigy Safety Lancets 26G MISC 1 each by Does not  apply route 2 (two) times daily. Use as directed twice daily E11.9 300 each 3   rosuvastatin (CRESTOR) 20 MG tablet TAKE 1 TABLET BY MOUTH  DAILY 90 tablet 0   tamsulosin (FLOMAX) 0.4 MG CAPS capsule Take 0.4 mg by mouth every evening.   8   No current facility-administered medications for this visit.    Allergies:   Patient has no known allergies.    Social History:  The patient  reports that he quit smoking about 57 years ago. His smoking use included cigarettes. He has never used smokeless tobacco. He reports that he does not drink alcohol and does not use drugs.   Family History:  The patient's ***family history includes Blindness in an other family member; Diabetes in an other family member; Heart disease in an other family member; Hypertension in an other family member; Mental illness in an other family member; Sudden death in an other family  member.    ROS:  Please see the history of present illness.   Otherwise, review of systems are positive for {NONE DEFAULTED:18576}.   All other systems are reviewed and negative.    PHYSICAL EXAM: VS:  There were no vitals taken for this visit. , BMI There is no height or weight on file to calculate BMI. GEN: Well nourished, well developed, in no acute distress HEENT: normal Neck: no JVD, carotid bruits, or masses Cardiac: ***RRR; no murmurs, rubs, or gallops,no edema  Respiratory:  clear to auscultation bilaterally, normal work of breathing GI: soft, nontender, nondistended, + BS MS: no deformity or atrophy Skin: warm and dry, no rash Neuro:  Strength and sensation are intact Psych: euthymic mood, full affect   EKG:  EKG {ACTION; IS/IS MHD:[id number]} ordered today. The ekg ordered today demonstrates ***   Recent Labs: 02/17/2021: ALT 23; BUN 18; Creatinine, Ser 1.15; Hemoglobin 13.9; Platelets 217.0; Potassium 4.1; Sodium 141; TSH 1.54    Lipid Panel    Component Value Date/Time   CHOL 111 02/17/2021 0854   TRIG 99.0 02/17/2021 0854   HDL 54.90 02/17/2021 0854   CHOLHDL 2 02/17/2021 0854   VLDL 19.8 02/17/2021 0854   LDLCALC 36 02/17/2021 0854      Wt Readings from Last 3 Encounters:  04/07/21 175 lb (79.4 kg)  02/17/21 180 lb (81.6 kg)  02/01/21 180 lb (81.6 kg)      Other studies Reviewed: Additional studies/ records that were reviewed today include: ***. Review of the above records demonstrates: ***   ASSESSMENT AND PLAN:  1.  ***   Current medicines are reviewed at length with the patient today.  The patient {ACTIONS; HAS/DOES NOT HAVE:19233} concerns regarding medicines.  The following changes have been made:  {PLAN; NO CHANGE:13088:s}  Labs/ tests ordered today include: *** No orders of the defined types were placed in this encounter.        Disposition:   FU with *** in {gen number 9-21:194174} {Days to years:10300}  Signed, Cayle Thunder Martinique,  MD  04/10/2021 2:35 PM    [facility] [address], [place], [place], [address] Phone [phone], Fax [phone]

## 2021-04-12 ENCOUNTER — Encounter: Payer: Self-pay | Admitting: Internal Medicine

## 2021-04-12 NOTE — Assessment & Plan Note (Signed)
Resolved post surgury,  to f/u any worsening symptoms or concerns ?

## 2021-04-12 NOTE — Assessment & Plan Note (Signed)
Current ECG with ectopy only, asympt, for but to f/u with cardiology as pt was told he needed to do based on possible afib perioperative recent ?

## 2021-04-12 NOTE — Assessment & Plan Note (Signed)
Lab Results  ?Component Value Date  ? HGBA1C 7.1 (A) 04/07/2021  ? ?Very mild uncontrolled, pt to continue current medical treatment metformin ER 500 x 2 qd no change for now ? ?

## 2021-04-14 ENCOUNTER — Ambulatory Visit (INDEPENDENT_AMBULATORY_CARE_PROVIDER_SITE_OTHER): Payer: Medicare HMO

## 2021-04-14 ENCOUNTER — Encounter: Payer: Self-pay | Admitting: Cardiology

## 2021-04-14 ENCOUNTER — Ambulatory Visit (INDEPENDENT_AMBULATORY_CARE_PROVIDER_SITE_OTHER): Payer: Medicare HMO | Admitting: Cardiology

## 2021-04-14 ENCOUNTER — Other Ambulatory Visit: Payer: Self-pay

## 2021-04-14 VITALS — BP 120/78 | HR 85 | Ht 69.0 in | Wt 175.4 lb

## 2021-04-14 DIAGNOSIS — I499 Cardiac arrhythmia, unspecified: Secondary | ICD-10-CM | POA: Diagnosis not present

## 2021-04-14 DIAGNOSIS — E7800 Pure hypercholesterolemia, unspecified: Secondary | ICD-10-CM

## 2021-04-14 DIAGNOSIS — E119 Type 2 diabetes mellitus without complications: Secondary | ICD-10-CM | POA: Diagnosis not present

## 2021-04-14 DIAGNOSIS — I10 Essential (primary) hypertension: Secondary | ICD-10-CM | POA: Diagnosis not present

## 2021-04-14 NOTE — Progress Notes (Unsigned)
Enrolled for [employer] to mail a ZIO XT long term holter monitor to the patients address on file.  

## 2021-04-14 NOTE — Patient Instructions (Signed)
Medication Instructions:  ?Continue same medications ? ? ?Lab Work: ?None ordered ? ? ?Testing/Procedures: ?14 day ZIO Heart Monitor ? ? ?Follow-Up: ?At [facility], you and your health needs are our priority.  As part of our continuing mission to provide you with exceptional heart care, we have created designated Provider Care Teams.  These Care Teams include your primary Cardiologist (physician) and Advanced Practice Providers (APPs -  Physician Assistants and Nurse Practitioners) who all work together to provide you with the care you need, when you need it. ? ?We recommend signing up for the patient portal called "MyChart".  Sign up information is provided on this After Visit Summary.  MyChart is used to connect with patients for Virtual Visits (Telemedicine).  Patients are able to view lab/test results, encounter notes, upcoming appointments, etc.  Non-urgent messages can be sent to your provider as well.   ?To learn more about what you can do with MyChart, go to [email or web address].   ? ?Your next appointment:  To Be Determined after monitor ?  ? ?The format for your next appointment: Office ? ? ?Provider: Dr.Jordan   ? ? ?

## 2021-04-16 DIAGNOSIS — I10 Essential (primary) hypertension: Secondary | ICD-10-CM

## 2021-04-16 DIAGNOSIS — I499 Cardiac arrhythmia, unspecified: Secondary | ICD-10-CM | POA: Diagnosis not present

## 2021-04-16 DIAGNOSIS — E7800 Pure hypercholesterolemia, unspecified: Secondary | ICD-10-CM | POA: Diagnosis not present

## 2021-04-16 DIAGNOSIS — E119 Type 2 diabetes mellitus without complications: Secondary | ICD-10-CM

## 2021-04-27 DIAGNOSIS — R9389 Abnormal findings on diagnostic imaging of other specified body structures: Secondary | ICD-10-CM | POA: Diagnosis not present

## 2021-04-27 DIAGNOSIS — R972 Elevated prostate specific antigen [PSA]: Secondary | ICD-10-CM | POA: Diagnosis not present

## 2021-05-08 ENCOUNTER — Other Ambulatory Visit: Payer: Self-pay

## 2021-05-08 DIAGNOSIS — E7800 Pure hypercholesterolemia, unspecified: Secondary | ICD-10-CM | POA: Diagnosis not present

## 2021-05-08 DIAGNOSIS — E119 Type 2 diabetes mellitus without complications: Secondary | ICD-10-CM | POA: Diagnosis not present

## 2021-05-08 DIAGNOSIS — I499 Cardiac arrhythmia, unspecified: Secondary | ICD-10-CM

## 2021-05-08 DIAGNOSIS — I10 Essential (primary) hypertension: Secondary | ICD-10-CM | POA: Diagnosis not present

## 2021-05-08 MED ORDER — METOPROLOL SUCCINATE ER 25 MG PO TB24: 25.0000 mg | ORAL_TABLET | Freq: Every day | ORAL | 3 refills | Status: DC

## 2021-05-09 ENCOUNTER — Other Ambulatory Visit: Payer: Self-pay | Admitting: Internal Medicine

## 2021-05-09 NOTE — Telephone Encounter (Signed)
Please refill as per office routine med refill policy (all routine meds to be refilled for 3 mo or monthly (per pt preference) up to one year from last visit, then month to month grace period for 3 mo, then further med refills will have to be denied) ? ?

## 2021-05-25 ENCOUNTER — Ambulatory Visit (HOSPITAL_COMMUNITY): Payer: Medicare HMO | Attending: Internal Medicine

## 2021-05-25 DIAGNOSIS — I080 Rheumatic disorders of both mitral and aortic valves: Secondary | ICD-10-CM | POA: Insufficient documentation

## 2021-05-25 DIAGNOSIS — E119 Type 2 diabetes mellitus without complications: Secondary | ICD-10-CM | POA: Insufficient documentation

## 2021-05-25 DIAGNOSIS — I351 Nonrheumatic aortic (valve) insufficiency: Secondary | ICD-10-CM

## 2021-05-25 DIAGNOSIS — R9431 Abnormal electrocardiogram [ECG] [EKG]: Secondary | ICD-10-CM | POA: Insufficient documentation

## 2021-05-25 DIAGNOSIS — I10 Essential (primary) hypertension: Secondary | ICD-10-CM | POA: Diagnosis not present

## 2021-05-25 DIAGNOSIS — E785 Hyperlipidemia, unspecified: Secondary | ICD-10-CM | POA: Diagnosis not present

## 2021-05-25 DIAGNOSIS — I340 Nonrheumatic mitral (valve) insufficiency: Secondary | ICD-10-CM | POA: Diagnosis not present

## 2021-05-25 DIAGNOSIS — I499 Cardiac arrhythmia, unspecified: Secondary | ICD-10-CM | POA: Insufficient documentation

## 2021-05-25 LAB — ECHOCARDIOGRAM COMPLETE
Area-P 1/2: 3.62 cm2
P 1/2 time: 544 msec
S' Lateral: 3.1 cm

## 2021-05-25 NOTE — Progress Notes (Signed)
?  ?Cardiology Office Note ? ? ?Date:  05/27/2021  ? ?ID:  Vincent [name], DOB [birth year], MRN [id number] ? ?PCP:  Biagio Borg, MD  ?Cardiologist:   Keyonia Gluth Martinique, MD  ? ?No chief complaint on file. ? ? ?  ?History of Present Illness: ?Vincent [name] is a 76 y.o. male who is seen for follow up  of palpitations. He has a history of DM, HTN, HLD and blindness. Recently he had outpatient lumbar surgery by Dr Ellene Route and apparently was noted to have Afib. This is not documented in our records. He denies any symptoms. When seen by Dr Jenny Reichmann last week was in NSR. No prior history of arrhythmia. Denies any chest pain or dyspnea. Has noted fatigue since he had Covid in 2020. No edema. No history of stroke or bleeding.  ? ?Event monitor was done and showed frequent PACs with short runs of SVT. No Afib seen. He was started on low dose Toprol. Echo done and showed normal LV function and only mild AI and MR. Since being on the Toprol he has felt more fatigued, and dizzy and complains of stomach pain. ? ? ? ?Past Medical History:  ?Diagnosis Date  ? Allergic rhinitis, cause unspecified 02/08/2011  ? Anemia, unspecified 04/11/2011  ? hx of  ? Arthritis   ? knee  ? Blindness 02/06/2011  ? total  ? BPH (benign prostatic hypertrophy) 02/08/2011  ? Cataract   ? as a child, blind since 23 or 39 yrs old  ? Chronic LBP 02/08/2011  ? DDD (degenerative disc disease), cervical 04/11/2011  ? Diabetes mellitus type 2, diet-controlled ([place])   ? on meds  ? Diverticulosis   ? Elevated PSA 02/08/2011  ? GERD (gastroesophageal reflux disease) 02/08/2011  ? with certain foods-on meds  ? 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/2011  ? pt unaware  ? History of colon polyps   ? History of kidney stones   ? History of prostatitis 04/11/2011  ? Hyperlipidemia   ? on meds  ? Hypertension   ? on meds  ? Lumbar disc disease 02/06/2011  ? Vitamin D deficiency   ? Wears partial dentures   ? upper and lower  ? ? ?Past Surgical History:  ?Procedure  Laterality Date  ? BACK SURGERY    ? x 3  L4-L5  ? COLONOSCOPY  2007  ? Dr Wynetta Emery  ? COLONOSCOPY  2017  ? SA-MAC-suprep(good)-SSP x 1  ? CYST EXCISION    ? POLYPECTOMY    ? SPP x 1  ? THULIUM LASER TURP (TRANSURETHRAL RESECTION OF PROSTATE) N/A 10/27/2018  ? Procedure: THULIUM LASER TURP (TRANSURETHRAL RESECTION OF PROSTATE);  Surgeon: Festus Aloe, MD;  Location: [facility];  Service: Urology;  Laterality: N/A;  ? WISDOM TOOTH EXTRACTION    ? ? ? ?Current Outpatient Medications  ?Medication Sig Dispense Refill  ? amLODipine (NORVASC) 5 MG tablet TAKE 1 TABLET EVERY DAY 90 tablet 1  ? aspirin 81 MG EC tablet Take 1 tablet (81 mg total) by mouth daily. Swallow whole. (Patient taking differently: Take 324 mg by mouth daily. Swallow whole.) 30 tablet 12  ? Blood Glucose Monitoring Suppl (PRODIGY VOICE BLOOD GLUCOSE) w/Device KIT 1 each by Does not apply route 2 (two) times daily. Use as directed twice daily E11.9 1 kit 0  ? Continuous Blood Gluc Receiver (FREESTYLE LIBRE READER) DEVI Use as directed twice daily E11.9 1 each 0  ? Continuous Blood Gluc Sensor (FREESTYLE LIBRE SENSOR SYSTEM) MISC Use  as directed twice daily E11.9 6 each 3  ? diazepam (VALIUM) 5 MG tablet Take 5 mg by mouth every 6 (six) hours as needed.    ? finasteride (PROSCAR) 5 MG tablet TK 1 T PO QHS 90 tablet 0  ? gabapentin (NEURONTIN) 100 MG capsule Take 1 capsule (100 mg total) by mouth 2 (two) times daily. 60 capsule 0  ? glucose blood test strip Use as instructed twice daily E11.9 300 each 3  ? metFORMIN (GLUCOPHAGE-XR) 500 MG 24 hr tablet Take 2 tablets (1,000 mg total) by mouth daily with breakfast. 180 tablet 3  ? Multiple Vitamin (MULTIVITAMIN PO) Take 1 tablet by mouth daily at 6 (six) AM.    ? Prodigy Safety Lancets 26G MISC 1 each by Does not apply route 2 (two) times daily. Use as directed twice daily E11.9 300 each 3  ? rosuvastatin (CRESTOR) 20 MG tablet TAKE 1 TABLET EVERY DAY (NEED MD APPOINTMENT) 90 tablet 1   ? tamsulosin (FLOMAX) 0.4 MG CAPS capsule Take 0.4 mg by mouth every evening.   8  ? ?No current facility-administered medications for this visit.  ? ? ?Allergies:   Patient has no known allergies.  ? ? ?Social History:  The patient  reports that he quit smoking about 58 years ago. His smoking use included cigarettes. He has never used smokeless tobacco. He reports that he does not drink alcohol and does not use drugs.  ? ?Family History:  The patient's family history includes Blindness in an other family member; Diabetes in an other family member; Heart disease in an other family member; Hypertension in an other family member; Mental illness in an other family member; Sudden death in an other family member.  ? ? ?ROS:  Please see the history of present illness.   Otherwise, review of systems are positive for none.   All other systems are reviewed and negative.  ? ? ?PHYSICAL EXAM: ?VS:  BP 139/85   Pulse 97   Ht _0  (1.753 m)   Wt 177 lb 6.4 oz (80.5 kg)   SpO2 99%   BMI 26.20 kg/m?  , BMI Body mass index is 26.2 kg/m?. ?GEN: Well nourished, well developed, in no acute distress ?HEENT: normal ?Neck: no JVD, carotid bruits, or masses ?Cardiac: RRR; no murmurs, rubs, or gallops,no edema  ?Respiratory:  clear to auscultation bilaterally, normal work of breathing ?GI: soft, nontender, nondistended, + BS ?MS: no deformity or atrophy ?Skin: warm and dry, no rash ?Neuro:  Strength and sensation are intact ?Psych: euthymic mood, full affect ? ? ?EKG:  EKG is not ordered today. ? ? ? ? ?Recent Labs: ?02/17/2021: ALT 23; BUN 18; Creatinine, Ser 1.15; Hemoglobin 13.9; Platelets 217.0; Potassium 4.1; Sodium 141; TSH 1.54  ? ? ?Lipid Panel ?   ?Component Value Date/Time  ? CHOL 111 02/17/2021 0854  ? TRIG 99.0 02/17/2021 0854  ? HDL 54.90 02/17/2021 0854  ? CHOLHDL 2 02/17/2021 0854  ? VLDL 19.8 02/17/2021 0854  ? [place] 36 02/17/2021 0854  ? ?  ? ?Wt Readings from Last 3 Encounters:  ?05/27/21 177 lb 6.4 oz (80.5 kg)   ?04/14/21 175 lb 6.4 oz (79.6 kg)  ?04/07/21 175 lb (79.4 kg)  ?  ? ? ?Other studies Reviewed: ?Additional studies/ records that were reviewed today include: CT chest done in Dec 2020 showed no significant coronary calcification. I reviewed personally.  ? ?Event monitor 05/08/21: Study Highlights ? ?  ?Normal sinus rhythm ?Frequent PACs with  multiple short runs of SVT. longest lasting 34 seconds ?Single 7 beat run of NSVT ?  ?  ?Patch Wear Time:  14 days and 0 hours (2023-03-16T18:33:44-0400 to 2023-03-30T18:33:48-0400) ?  ?Patient had a min HR of 55 bpm, max HR of 218 bpm, and avg HR of 85 bpm. Predominant underlying rhythm was Sinus Rhythm. Slight P wave morphology changes were noted. 1 run of Ventricular Tachycardia occurred lasting 7 beats with a max rate of 138 bpm  ?(avg 125 bpm). 5601 Supraventricular Tachycardia runs occurred, the run with the fastest interval lasting 4 beats with a max rate of 218 bpm, the longest lasting 34.0 secs with an avg rate of 108 bpm. Isolated SVEs were frequent (8.9%, 542706), SVE  ?Couplets were occasional (1.4%, 11931), and SVE Triplets were occasional (2.2%, 12900). Isolated VEs were rare (<1.0%), VE Couplets were rare (<1.0%), and no VE Triplets were present. ?  ? ? ?ASSESSMENT AND PLAN: ? ?1.  Possible Afib. Not documented. Noted when he had outpatient lumbar surgery. Has been in NSR since.  Event monitor showed no Afib. Only PACs with short run of SVT. No further work up required. Risk is very low. Feels worse on Toprol XL so will discontinue. Follow up PRN. ?2. HTN. Well controlled ?3. DM type 2.  ?4. HLD excellent control on statin.  ? ? ?Current medicines are reviewed at length with the patient today.  The patient does not have concerns regarding medicines. ? ?The following changes have been made:  no change ? ?Labs/ tests ordered today include:  ? ?No orders of the defined types were placed in this encounter. ? ? ?   ? ? ?Disposition:   FU PRN.  ? ?Signed, ?Hadasah Brugger Martinique,  MD  ?05/27/2021 2:50 PM    ?[facility] ?[address], [place], [place], [address] ?Phone [phone], Fax [phone] ? ? ?

## 2021-05-27 ENCOUNTER — Ambulatory Visit: Payer: Medicare HMO | Admitting: Cardiology

## 2021-05-27 ENCOUNTER — Encounter: Payer: Self-pay | Admitting: Cardiology

## 2021-05-27 VITALS — BP 139/85 | HR 97 | Ht 69.0 in | Wt 177.4 lb

## 2021-05-27 DIAGNOSIS — I10 Essential (primary) hypertension: Secondary | ICD-10-CM | POA: Diagnosis not present

## 2021-05-27 DIAGNOSIS — I491 Atrial premature depolarization: Secondary | ICD-10-CM

## 2021-05-27 DIAGNOSIS — E7800 Pure hypercholesterolemia, unspecified: Secondary | ICD-10-CM | POA: Diagnosis not present

## 2021-06-05 NOTE — Addendum Note (Signed)
Addended by: Terence Lux A on: 06/05/2021 02:16 PM ? ? Modules accepted: Orders ? ?

## 2021-06-09 DIAGNOSIS — R972 Elevated prostate specific antigen [PSA]: Secondary | ICD-10-CM | POA: Diagnosis not present

## 2021-07-10 ENCOUNTER — Other Ambulatory Visit: Payer: Self-pay | Admitting: Internal Medicine

## 2021-07-10 NOTE — Telephone Encounter (Signed)
Please refill as per office routine med refill policy (all routine meds to be refilled for 3 mo or monthly (per pt preference) up to one year from last visit, then month to month grace period for 3 mo, then further med refills will have to be denied) ? ?

## 2021-09-18 ENCOUNTER — Telehealth: Payer: Self-pay | Admitting: *Deleted

## 2021-09-18 ENCOUNTER — Encounter: Payer: Self-pay | Admitting: *Deleted

## 2021-09-18 NOTE — Patient Outreach (Signed)
  Care Coordination   09/18/2021 Name: Requan Hardge MRN: [id number] DOB: 1946-01-07   Care Coordination Outreach Attempts:  An unsuccessful telephone outreach was attempted today to offer the patient information about available care coordination services as a benefit of their health plan.   Follow Up Plan:  Additional outreach attempts will be made to offer the patient care coordination information and services.   Encounter Outcome:  No Answer- left voicemail message requesting call back  Care Coordination Interventions Activated:  No   Care Coordination Interventions:  No, not indicated    Oneta Rack, RN, BSN, CCRN Alumnus RN [employer] [facility] [phone]: direct office

## 2021-09-22 ENCOUNTER — Telehealth: Payer: Self-pay

## 2021-09-22 ENCOUNTER — Encounter: Payer: Self-pay | Admitting: *Deleted

## 2021-09-22 ENCOUNTER — Telehealth: Payer: Self-pay | Admitting: *Deleted

## 2021-09-22 NOTE — Patient Outreach (Signed)
  Care Coordination   Initial Visit Note   09/22/2021 Name: Vincent [name] MRN: [id number] DOB: [birth year]  Vincent [name] is a 76 y.o. year old male who sees Biagio Borg, MD for primary care. I spoke with  Vincent [name] by phone today  What matters to the patients health and wellness today?  "I am doing great and don't have any concerns; everything is going well; I go to see Dr. Jenny Reichmann again on 10/08/21 and if they can do the AWV then, that would be great so I can get it all done in one day"    Goals Addressed             This Visit's Progress    COMPLETED: Care Coordination Activities- No follow up needed   On track    Care Coordination Interventions: Evaluation of current treatment plan related to HTN and patient's adherence to plan as established by provider Collaborated with PCP office team regarding scheduling medicare AWV- patient would like to do on same day as upcoming PCP office visit scheduled 10/08/21 Reviewed scheduled/upcoming provider appointments including PCP- 9/07//23 at 11:20 am Assessed social determinant of health barriers Confirmed patient continues to monitor blood pressures at home several times per week - reports BP today of "123/82;" states, "all of my blood pressures look great at home;" confirms follows low salt/ heart healthy diet; denies medication needs, declines medication review; states "I am going to the doctor soon and taking all the medicines like I am supposed to"          SDOH assessments and interventions completed:  Yes  SDOH Interventions Today    Flowsheet Row Most Recent Value  SDOH Interventions   Food Insecurity Interventions Intervention Not Indicated  Transportation Interventions Intervention Not Indicated  [Family provides transportation]       Care Coordination Interventions Activated:  Yes  Care Coordination Interventions:  Yes, provided facilitated scheduling of AWV with PCP office   Follow up plan: No  further intervention required.   Encounter Outcome:  Pt. Visit Completed   Oneta Rack, RN, BSN, [place] [facility] [phone]: direct office

## 2021-09-22 NOTE — Patient Instructions (Signed)
Visit Information  Thank you for taking time to visit with me today. Please don't hesitate to contact me if I can be of assistance to you.   Following are the goals we discussed today:   Goals Addressed             This Visit's Progress    COMPLETED: Care Coordination Activities- No follow up needed   On track    Care Coordination Interventions: Evaluation of current treatment plan related to HTN and patient's adherence to plan as established by provider Collaborated with PCP office team regarding scheduling medicare AWV- patient would like to do on same day as upcoming PCP office visit scheduled 10/08/21 Reviewed scheduled/upcoming provider appointments including PCP- 9/07//23 at 11:20 am Assessed social determinant of health barriers Confirmed patient continues to monitor blood pressures at home several times per week - reports BP today of "123/82;" states, "all of my blood pressures look great at home;" confirms follows low salt/ heart healthy diet; denies medication needs, declines medication review; states "I am going to the doctor soon and taking all the medicines like I am supposed to"          If you are experiencing a Mental Health or [place] or need someone to talk to, please  call the Suicide and Crisis Lifeline: 988 call the [place] National Suicide Prevention Lifeline: 315-847-4237 or TTY: 2563480986 TTY 936 736 8851) to talk to a trained counselor call 1-800-273-TALK (toll free, 24 hour hotline) go to [facility] [address], [place] [phone]) call the [place]: [phone] call 911   Patient verbalizes understanding of instructions and care plan provided today and agrees to view in [place]. Active MyChart status and patient understanding of how to access instructions and care plan via MyChart confirmed with patient.     No further follow up required: Facilitated scheduling of [place]  AWV; patient denies current care coordination needs  Oneta Rack, RN, BSN, [place] [facility] [phone]: direct office

## 2021-09-22 NOTE — Telephone Encounter (Signed)
Pt is requesting to do AWV on same day of office visit 9/7.  I reached out to pt but didn't get an answer

## 2021-09-30 ENCOUNTER — Telehealth: Payer: Self-pay

## 2021-09-30 ENCOUNTER — Other Ambulatory Visit: Payer: Self-pay | Admitting: Internal Medicine

## 2021-09-30 ENCOUNTER — Ambulatory Visit (INDEPENDENT_AMBULATORY_CARE_PROVIDER_SITE_OTHER): Payer: Medicare HMO

## 2021-09-30 VITALS — Ht 69.0 in | Wt 172.4 lb

## 2021-09-30 DIAGNOSIS — E559 Vitamin D deficiency, unspecified: Secondary | ICD-10-CM

## 2021-09-30 DIAGNOSIS — E1165 Type 2 diabetes mellitus with hyperglycemia: Secondary | ICD-10-CM

## 2021-09-30 DIAGNOSIS — Z0000 Encounter for general adult medical examination without abnormal findings: Secondary | ICD-10-CM

## 2021-09-30 DIAGNOSIS — R972 Elevated prostate specific antigen [PSA]: Secondary | ICD-10-CM

## 2021-09-30 NOTE — Telephone Encounter (Signed)
Ok labs are ordered 

## 2021-09-30 NOTE — Patient Instructions (Signed)
Vincent [name] , Thank you for taking time to come for your Medicare Wellness Visit. I appreciate your ongoing commitment to your health goals. Please review the following plan we discussed and let me know if I can assist you in the future.   Screening recommendations/referrals: Colonoscopy: 12/31/2020; due every 5 years Recommended yearly ophthalmology/optometry visit for glaucoma screening and checkup Recommended yearly dental visit for hygiene and checkup  Vaccinations: Influenza vaccine: 10/29/2020; due Fall 2023 Pneumococcal vaccine: 08/01/2012, 09/10/2014 Tdap vaccine: 06/15/2010; due every 10 years (Overdue) Shingles vaccine: due   Covid-19: 03/20/2019, 04/18/2019, 12/07/2019  Advanced directives: Yes; Please bring a copy of your health care power of attorney and living will to the office at your convenience.  Conditions/risks identified: Yes  Next appointment: Follow up in one year for your annual wellness visit.   Preventive Care 76 Years and Older, Male  Preventive care refers to lifestyle choices and visits with your health care provider that can promote health and wellness. What does preventive care include? A yearly physical exam. This is also called an annual well check. Dental exams once or twice a year. Routine eye exams. Ask your health care provider how often you should have your eyes checked. Personal lifestyle choices, including: Daily care of your teeth and gums. Regular physical activity. Eating a healthy diet. Avoiding tobacco and drug use. Limiting alcohol use. Practicing safe sex. Taking low doses of aspirin every day. Taking vitamin and mineral supplements as recommended by your health care provider. What happens during an annual well check? The services and screenings done by your health care provider during your annual well check will depend on your age, overall health, lifestyle risk factors, and family history of disease. Counseling  Your health care  provider may ask you questions about your: Alcohol use. Tobacco use. Drug use. Emotional well-being. Home and relationship well-being. Sexual activity. Eating habits. History of falls. Memory and ability to understand (cognition). Work and work Statistician. Screening  You may have the following tests or measurements: Height, weight, and BMI. Blood pressure. Lipid and cholesterol levels. These may be checked every 5 years, or more frequently if you are over 7 years old. Skin check. Lung cancer screening. You may have this screening every year starting at age 95 if you have a 30-pack-year history of smoking and currently smoke or have quit within the past 15 years. Fecal occult blood test (FOBT) of the stool. You may have this test every year starting at age 7. Flexible sigmoidoscopy or colonoscopy. You may have a sigmoidoscopy every 5 years or a colonoscopy every 10 years starting at age 60. Prostate cancer screening. Recommendations will vary depending on your family history and other risks. Hepatitis C blood test. Hepatitis B blood test. Sexually transmitted disease (STD) testing. Diabetes screening. This is done by checking your blood sugar (glucose) after you have not eaten for a while (fasting). You may have this done every 1-3 years. Abdominal aortic aneurysm (AAA) screening. You may need this if you are a current or former smoker. Osteoporosis. You may be screened starting at age 16 if you are at high risk. Talk with your health care provider about your test results, treatment options, and if necessary, the need for more tests. Vaccines  Your health care provider may recommend certain vaccines, such as: Influenza vaccine. This is recommended every year. Tetanus, diphtheria, and acellular pertussis (Tdap, Td) vaccine. You may need a Td booster every 10 years. Zoster vaccine. You may need this after age  60. Pneumococcal 13-valent conjugate (PCV13) vaccine. One dose is  recommended after age 107. Pneumococcal polysaccharide (PPSV23) vaccine. One dose is recommended after age 52. Talk to your health care provider about which screenings and vaccines you need and how often you need them. This information is not intended to replace advice given to you by your health care provider. Make sure you discuss any questions you have with your health care provider. Document Released: 02/14/2015 Document Revised: 10/08/2015 Document Reviewed: 11/19/2014 Elsevier Interactive Patient Education  2017 [employer] Prevention in the Home Falls can cause injuries. They can happen to people of all ages. There are many things you can do to make your home safe and to help prevent falls. What can I do on the outside of my home? Regularly fix the edges of walkways and driveways and fix any cracks. Remove anything that might make you trip as you walk through a door, such as a raised step or threshold. Trim any bushes or trees on the path to your home. Use bright outdoor lighting. Clear any walking paths of anything that might make someone trip, such as rocks or tools. Regularly check to see if handrails are loose or broken. Make sure that both sides of any steps have handrails. Any raised decks and porches should have guardrails on the edges. Have any leaves, snow, or ice cleared regularly. Use sand or salt on walking paths during winter. Clean up any spills in your garage right away. This includes oil or grease spills. What can I do in the bathroom? Use night lights. Install grab bars by the toilet and in the tub and shower. Do not use towel bars as grab bars. Use non-skid mats or decals in the tub or shower. If you need to sit down in the shower, use a plastic, non-slip stool. Keep the floor dry. Clean up any water that spills on the floor as soon as it happens. Remove soap buildup in the tub or shower regularly. Attach bath mats securely with double-sided non-slip rug  tape. Do not have throw rugs and other things on the floor that can make you trip. What can I do in the bedroom? Use night lights. Make sure that you have a light by your bed that is easy to reach. Do not use any sheets or blankets that are too big for your bed. They should not hang down onto the floor. Have a firm chair that has side arms. You can use this for support while you get dressed. Do not have throw rugs and other things on the floor that can make you trip. What can I do in the kitchen? Clean up any spills right away. Avoid walking on wet floors. Keep items that you use a lot in easy-to-reach places. If you need to reach something above you, use a strong step stool that has a grab bar. Keep electrical cords out of the way. Do not use floor polish or wax that makes floors slippery. If you must use wax, use non-skid floor wax. Do not have throw rugs and other things on the floor that can make you trip. What can I do with my stairs? Do not leave any items on the stairs. Make sure that there are handrails on both sides of the stairs and use them. Fix handrails that are broken or loose. Make sure that handrails are as long as the stairways. Check any carpeting to make sure that it is firmly attached to the stairs. Fix  any carpet that is loose or worn. Avoid having throw rugs at the top or bottom of the stairs. If you do have throw rugs, attach them to the floor with carpet tape. Make sure that you have a light switch at the top of the stairs and the bottom of the stairs. If you do not have them, ask someone to add them for you. What else can I do to help prevent falls? Wear shoes that: Do not have high heels. Have rubber bottoms. Are comfortable and fit you well. Are closed at the toe. Do not wear sandals. If you use a stepladder: Make sure that it is fully opened. Do not climb a closed stepladder. Make sure that both sides of the stepladder are locked into place. Ask someone to  hold it for you, if possible. Clearly mark and make sure that you can see: Any grab bars or handrails. First and last steps. Where the edge of each step is. Use tools that help you move around (mobility aids) if they are needed. These include: Canes. Walkers. Scooters. Crutches. Turn on the lights when you go into a dark area. Replace any light bulbs as soon as they burn out. Set up your furniture so you have a clear path. Avoid moving your furniture around. If any of your floors are uneven, fix them. If there are any pets around you, be aware of where they are. Review your medicines with your doctor. Some medicines can make you feel dizzy. This can increase your chance of falling. Ask your doctor what other things that you can do to help prevent falls. This information is not intended to replace advice given to you by your health care provider. Make sure you discuss any questions you have with your health care provider. Document Released: 11/14/2008 Document Revised: 06/26/2015 Document Reviewed: 02/22/2014 Elsevier Interactive Patient Education  2017 [employer].

## 2021-09-30 NOTE — Telephone Encounter (Signed)
Patient is scheduled for ov next week and would like to have an lab order to be placed at [facility] before appt.  Do you agree?  Please send e-chart message to patient.

## 2021-09-30 NOTE — Progress Notes (Signed)
Subjective:   Vincent [name] is a 76 y.o. male who presents for Medicare Annual/Subsequent preventive examination.  Review of Systems    Virtual Visit via Telephone Note  I connected with  Vincent [name] on 09/30/21 at 10:45 AM EDT by telephone and verified that I am speaking with the correct person using two identifiers.  Location: Patient: Home Provider: [place] Persons participating in the virtual visit: [place]   I discussed the limitations, risks, security and privacy concerns of performing an evaluation and management service by telephone and the availability of in person appointments. The patient expressed understanding and agreed to proceed.  Interactive audio and video telecommunications were attempted between this nurse and patient, however failed, due to patient having technical difficulties OR patient did not have access to video capability.  We continued and completed visit with audio only.  Some vital signs may be absent or patient reported.   Sheral Flow, LPN  Cardiac Risk Factors include: advanced age (>35mn, >>39women);diabetes mellitus;dyslipidemia;male gender     Objective:    Today's Vitals   09/30/21 1100  Weight: 172 lb 6.4 oz (78.2 kg)  Height: _0  (1.753 m)  PainSc: 0-No pain   Body mass index is 25.46 kg/m.     09/30/2021   10:51 AM 02/01/2021    7:23 PM 01/13/2019    1:57 AM 10/27/2018    3:18 PM 10/27/2018    8:32 AM 10/28/2017   10:10 AM 12/11/2015    7:43 AM  Advanced Directives  Does Patient Have a Medical Advance Directive? Yes No No Yes Yes Yes Yes  Type of [place]   Living will;Healthcare Power of [place]   Does patient want to make changes to medical advance directive?    No - Patient declined No - Patient declined    Copy of [place]? No - copy requested   No -  copy requested   No - copy requested  Would patient like information on creating a medical advance directive?  No - Patient declined No - Patient declined        Current Medications (verified) Outpatient Encounter Medications as of 09/30/2021  Medication Sig   amLODipine (NORVASC) 5 MG tablet TAKE 1 TABLET EVERY DAY   aspirin 81 MG EC tablet Take 1 tablet (81 mg total) by mouth daily. Swallow whole. (Patient taking differently: Take 324 mg by mouth daily. Swallow whole.)   Blood Glucose Monitoring Suppl (PRODIGY VOICE BLOOD GLUCOSE) w/Device KIT 1 each by Does not apply route 2 (two) times daily. Use as directed twice daily E11.9   Continuous Blood Gluc Receiver (FREESTYLE LIBRE READER) DEVI Use as directed twice daily E11.9   Continuous Blood Gluc Sensor (FREESTYLE LIBRE SENSOR SYSTEM) MISC Use as directed twice daily E11.9   diazepam (VALIUM) 5 MG tablet Take 5 mg by mouth every 6 (six) hours as needed.   finasteride (PROSCAR) 5 MG tablet TK 1 T PO QHS   gabapentin (NEURONTIN) 100 MG capsule Take 1 capsule (100 mg total) by mouth 2 (two) times daily.   glucose blood test strip Use as instructed twice daily E11.9   metFORMIN (GLUCOPHAGE-XR) 500 MG 24 hr tablet TAKE 1 TABLET BY MOUTH EVERY DAY WITH BREAKFAST   Multiple Vitamin (MULTIVITAMIN PO) Take 1 tablet by mouth daily at 6 (six) AM.   Prodigy Safety Lancets 26G MISC 1 each by Does  not apply route 2 (two) times daily. Use as directed twice daily E11.9   rosuvastatin (CRESTOR) 20 MG tablet TAKE 1 TABLET EVERY DAY (NEED MD APPOINTMENT)   tamsulosin (FLOMAX) 0.4 MG CAPS capsule Take 0.4 mg by mouth every evening.    No facility-administered encounter medications on file as of 09/30/2021.    Allergies (verified) Patient has no known allergies.   History: Past Medical History:  Diagnosis Date   Allergic rhinitis, cause unspecified 02/08/2011   Anemia, unspecified 04/11/2011   hx of   Arthritis    knee   Blindness 02/06/2011   total    BPH (benign prostatic hypertrophy) 02/08/2011   Cataract    as a child, blind since 55 or 71 yrs old   Chronic LBP 02/08/2011   DDD (degenerative disc disease), cervical 04/11/2011   Diabetes mellitus type 2, diet-controlled ([place])    on meds   Diverticulosis    Elevated PSA 02/08/2011   GERD (gastroesophageal reflux disease) 84/53/6468   with certain foods-on meds   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/2011   pt unaware   History of colon polyps    History of kidney stones    History of prostatitis 04/11/2011   Hyperlipidemia    on meds   Hypertension    on meds   Lumbar disc disease 02/06/2011   Vitamin D deficiency    Wears partial dentures    upper and lower   Past Surgical History:  Procedure Laterality Date   BACK SURGERY     x 3  L4-L5   COLONOSCOPY  2007   Dr Wynetta Emery   COLONOSCOPY  2017   SA-MAC-suprep(good)-SSP x 1   CYST EXCISION     POLYPECTOMY     SPP x 1   THULIUM LASER TURP (TRANSURETHRAL RESECTION OF PROSTATE) N/A 10/27/2018   Procedure: THULIUM LASER TURP (TRANSURETHRAL RESECTION OF PROSTATE);  Surgeon: Festus Aloe, MD;  Location: [facility];  Service: Urology;  Laterality: N/A;   WISDOM TOOTH EXTRACTION     Family History  Problem Relation Age of Onset   Blindness Other    Heart disease Other    Hypertension Other    Diabetes Other    Mental illness Other    Sudden death Other    Colon cancer Neg Hx    Esophageal cancer Neg Hx    Stomach cancer Neg Hx    Rectal cancer Neg Hx    Colon polyps Neg Hx    Social History   Socioeconomic History   Marital status: Married    Spouse name: Not on file   Number of children: Not on file   Years of education: Not on file   Highest education level: Not on file  Occupational History   Not on file  Tobacco Use   Smoking status: Former    Types: Cigarettes    Quit date: 05/21/1963    Years since quitting: 58.4   Smokeless tobacco: Never  Vaping Use   Vaping Use: Never used   Substance and Sexual Activity   Alcohol use: No    Alcohol/week: 0.0 standard drinks of alcohol   Drug use: No   Sexual activity: Not on file  Other Topics Concern   Not on file  Social History Narrative   Not on file   Social Determinants of Health   Financial Resource Strain: Low Risk  (10/28/2017)   Overall Financial Resource Strain (CARDIA)    Difficulty of Paying Living Expenses: Not hard  at all  Food Insecurity: No Food Insecurity (09/30/2021)   Hunger Vital Sign    Worried About Running Out of Food in the Last Year: Never true    Ran Out of Food in the Last Year: Never true  Transportation Needs: No Transportation Needs (09/30/2021)   PRAPARE - Hydrologist (Medical): No    Lack of Transportation (Non-Medical): No  Physical Activity: Sufficiently Active (09/30/2021)   Exercise Vital Sign    Days of Exercise per Week: 5 days    Minutes of Exercise per Session: 30 min  Stress: No Stress Concern Present (09/30/2021)   [place]    Feeling of Stress : Not at all  Social Connections: [place] (09/30/2021)   Social Connection and Isolation Panel [NHANES]    Frequency of Communication with Friends and Family: More than three times a week    Frequency of Social Gatherings with Friends and Family: More than three times a week    Attends Religious Services: More than 4 times per year    Active Member of [employer] or Organizations: Yes    Attends Music therapist: More than 4 times per year    Marital Status: Married    Tobacco Counseling Counseling given: Not Answered   Clinical Intake:  Pre-visit preparation completed: Yes  Pain : No/denies pain Pain Score: 0-No pain     BMI - recorded: 25.46 Nutritional Status: BMI 25 -29 Overweight Nutritional Risks: None Diabetes: Yes CBG done?: No Did pt. bring in CBG monitor from home?: No  How often do you need  to have someone help you when you read instructions, pamphlets, or other written materials from your doctor or pharmacy?: 1 - Never What is the last grade level you completed in school?: HSG; 2 years of college  Nutrition Risk Assessment:  Has the patient had any N/V/D within the last 2 months?  No  Does the patient have any non-healing wounds?  No  Has the patient had any unintentional weight loss or weight gain?  No   Diabetes:  Is the patient diabetic?  Yes  If diabetic, was a CBG obtained today?  No  Did the patient bring in their glucometer from home?  No  How often do you monitor your CBG's? daily.   Financial Strains and Diabetes Management:  Are you having any financial strains with the device, your supplies or your medication? No .  Does the patient want to be seen by Chronic Care Management for management of their diabetes?  No  Would the patient like to be referred to a Nutritionist or for Diabetic Management?  No   Diabetic Exams:  Diabetic Eye Exam: Completed: Patient is legally blind. Overdue for diabetic eye exam. Pt has been advised about the importance in completing this exam. A referral has been placed today. Message sent to referral coordinator for scheduling purposes. Advised pt to expect a call from office referred to regarding appt.  Diabetic Foot Exam: Completed 04/22/2020. Pt has been advised about the importance in completing this exam. Pt is scheduled for diabetic foot exam on 04/22/2021.   Interpreter Needed?: No  Information entered by :: Lisette Abu, LPN.   Activities of Daily Living    09/30/2021   11:03 AM  In your present state of health, do you have any difficulty performing the following activities:  Hearing? 0  Vision? 1  Difficulty concentrating or making decisions? 0  Walking or climbing stairs? 0  Dressing or bathing? 0  Doing errands, shopping? 0  Preparing Food and eating ? N  Using the Toilet? N  In the past six months, have  you accidently leaked urine? N  Do you have problems with loss of bowel control? N  Managing your Medications? N  Managing your Finances? N  Housekeeping or managing your Housekeeping? N    Patient Care Team: Biagio Borg, MD as PCP - General (Internal Medicine) Kristeen Miss, MD as Consulting Physician (Neurosurgery) Armbruster, Carlota Raspberry, MD as Consulting Physician (Gastroenterology) Festus Aloe, MD as Consulting Physician (Urology) Rutherford Guys, MD as Consulting Physician (Ophthalmology)  Indicate any recent Medical Services you may have received from other than [facility] providers in the past year (date may be approximate).     Assessment:   This is a routine wellness examination for [place].  Hearing/Vision screen Hearing Screening - Comments:: Denies hearing difficulties   Vision Screening - Comments:: Patient is vision impaired; Eye exam done by: Rutherford Guys, MD.  Dietary issues and exercise activities discussed: Current Exercise Habits: Home exercise routine, Type of exercise: treadmill, Time (Minutes): 30, Frequency (Times/Week): 5, Weekly Exercise (Minutes/Week): 150, Exercise limited by: None identified   Goals Addressed             This Visit's Progress    To maintain my health.        Depression Screen    09/30/2021   11:01 AM 02/17/2021    8:17 AM 04/22/2020    9:55 AM 01/04/2020   10:33 AM 07/11/2018    9:14 AM 10/28/2017   10:10 AM 09/12/2017    2:47 PM  PHQ 2/9 Scores  PHQ - 2 Score 0 0 1 0 1 0 0    Fall Risk    09/30/2021   10:52 AM 02/17/2021    8:17 AM 04/22/2020    9:55 AM 04/22/2020    9:39 AM 05/10/2019    3:39 PM  Fall Risk   Falls in the past year? 0 0 0 0 0  Number falls in past yr: 0 0  0 0  Injury with Fall? 0 0  0 0  Risk for fall due to : No Fall Risks      Follow up Falls prevention discussed        FALL RISK PREVENTION PERTAINING TO THE HOME:  Any stairs in or around the home? No  If so, are there any without handrails? No   Home free of loose throw rugs in walkways, pet beds, electrical cords, etc? Yes  Adequate lighting in your home to reduce risk of falls? Yes   ASSISTIVE DEVICES UTILIZED TO PREVENT FALLS:  Life alert? No  Use of a cane, walker or w/c? No  Grab bars in the bathroom? Yes  Shower chair or bench in shower? No  Elevated toilet seat or a handicapped toilet? No   TIMED UP AND GO:  Was the test performed? No .  Length of time to ambulate 10 feet: n/a sec.   Appearance of gait: Gait not evaluated during this visit.  Cognitive Function:        09/30/2021   11:03 AM  6CIT Screen  What Year? 0 points  What month? 0 points  What time? 0 points  Count back from 20 0 points  Months in reverse 0 points  Repeat phrase 0 points  Total Score 0 points    Immunizations Immunization History  Administered Date(s) Administered  Influenza, High Dose Seasonal PF 10/14/2018   Influenza,inj,Quad PF,6+ Mos 12/18/2019   Influenza-Unspecified 12/12/2009, 10/29/2020   Moderna SARS-COV2 Booster Vaccination 12/07/2019   Moderna Sars-Covid-2 Vaccination 03/20/2019, 04/18/2019   Pneumococcal Conjugate-13 02/13/2004, 08/01/2012   Pneumococcal Polysaccharide-23 09/10/2014   Td 06/15/2010   Zoster, Live 06/11/2009    TDAP status: Due, Education has been provided regarding the importance of this vaccine. Advised may receive this vaccine at local pharmacy or Health Dept. Aware to provide a copy of the vaccination record if obtained from local pharmacy or Health Dept. Verbalized acceptance and understanding.  Flu Vaccine status: Due, Education has been provided regarding the importance of this vaccine. Advised may receive this vaccine at local pharmacy or Health Dept. Aware to provide a copy of the vaccination record if obtained from local pharmacy or Health Dept. Verbalized acceptance and understanding.  Pneumococcal vaccine status: Up to date  Covid-19 vaccine status: Completed vaccines  Qualifies  for Shingles Vaccine? Yes   Zostavax completed Yes   Shingrix Completed?: No.    Education has been provided regarding the importance of this vaccine. Patient has been advised to call insurance company to determine out of pocket expense if they have not yet received this vaccine. Advised may also receive vaccine at local pharmacy or Health Dept. Verbalized acceptance and understanding.  Screening Tests Health Maintenance  Topic Date Due   Zoster Vaccines- Shingrix (1 of 2) Never done   OPHTHALMOLOGY EXAM  10/08/2018   COVID-19 Vaccine (3 - Moderna series) 02/01/2020   FOOT EXAM  04/22/2021   INFLUENZA VACCINE  09/01/2021   TETANUS/TDAP  02/17/2022 (Originally 06/14/2020)   HEMOGLOBIN A1C  10/08/2021   Diabetic kidney evaluation - GFR measurement  02/17/2022   Diabetic kidney evaluation - Urine ACR  02/17/2022   COLONOSCOPY (Pts 45-70yr Insurance coverage will need to be confirmed)  12/31/2025   Pneumonia Vaccine 76 Years old  Completed   Hepatitis C Screening  Completed   HPV VACCINES  Aged Out    Health Maintenance  Health Maintenance Due  Topic Date Due   Zoster Vaccines- Shingrix (1 of 2) Never done   OPHTHALMOLOGY EXAM  10/08/2018   COVID-19 Vaccine (3 - Moderna series) 02/01/2020   FOOT EXAM  04/22/2021   INFLUENZA VACCINE  09/01/2021    Colorectal cancer screening: Type of screening: Colonoscopy. Completed 12/31/2020. Repeat every 5 years  Lung Cancer Screening: (Low Dose CT Chest recommended if Age 76-80years, 30 pack-year currently smoking OR have quit w/in 15years.) does not qualify.   Lung Cancer Screening Referral: no  Additional Screening:  Hepatitis C Screening: does qualify; Completed 09/18/2015  Vision Screening: Recommended annual ophthalmology exams for early detection of glaucoma and other disorders of the eye. Is the patient up to date with their annual eye exam?  Yes  Who is the provider or what is the name of the office in which the patient attends  annual eye exams? MRutherford Guys MD. If pt is not established with a provider, would they like to be referred to a provider to establish care? No .   Dental Screening: Recommended annual dental exams for proper oral hygiene  Community Resource Referral / Chronic Care Management: CRR required this visit?  No   CCM required this visit?  No      Plan:     I have personally reviewed and noted the following in the patient's chart:   Medical and social history Use of alcohol, tobacco or illicit drugs  Current  medications and supplements including opioid prescriptions. Patient is not currently taking opioid prescriptions. Functional ability and status Nutritional status Physical activity Advanced directives List of other physicians Hospitalizations, surgeries, and ER visits in previous 12 months Vitals Screenings to include cognitive, depression, and falls Referrals and appointments  In addition, I have reviewed and discussed with patient certain preventive protocols, quality metrics, and best practice recommendations. A written personalized care plan for preventive services as well as general preventive health recommendations were provided to patient.     Sheral Flow, LPN   [id number]   Nurse Notes:  Patient is cogitatively intact. There were no vitals filed for this visit.

## 2021-10-01 ENCOUNTER — Other Ambulatory Visit (INDEPENDENT_AMBULATORY_CARE_PROVIDER_SITE_OTHER): Payer: Medicare HMO

## 2021-10-01 DIAGNOSIS — R972 Elevated prostate specific antigen [PSA]: Secondary | ICD-10-CM | POA: Diagnosis not present

## 2021-10-01 DIAGNOSIS — E1165 Type 2 diabetes mellitus with hyperglycemia: Secondary | ICD-10-CM | POA: Diagnosis not present

## 2021-10-01 DIAGNOSIS — E559 Vitamin D deficiency, unspecified: Secondary | ICD-10-CM

## 2021-10-01 LAB — HEMOGLOBIN A1C: Hgb A1c MFr Bld: 7.2 % — ABNORMAL HIGH (ref 4.6–6.5)

## 2021-10-01 LAB — BASIC METABOLIC PANEL
BUN: 12 mg/dL (ref 6–23)
CO2: 26 mEq/L (ref 19–32)
Calcium: 9.6 mg/dL (ref 8.4–10.5)
Chloride: 104 mEq/L (ref 96–112)
Creatinine, Ser: 1.14 mg/dL (ref 0.40–1.50)
GFR: 62.83 mL/min (ref >60.00)
Glucose, Bld: 105 mg/dL — ABNORMAL HIGH (ref 70–99)
Potassium: 4.1 mEq/L (ref 3.5–5.1)
Sodium: 137 mEq/L (ref 135–145)

## 2021-10-01 LAB — LIPID PANEL
Cholesterol: 102 mg/dL (ref 0–200)
HDL: 51.2 mg/dL (ref >39.00)
LDL Cholesterol: 41 mg/dL (ref 0–99)
NonHDL: 50.6
Total CHOL/HDL Ratio: 2
Triglycerides: 47 mg/dL (ref 0.0–149.0)
VLDL: 9.4 mg/dL (ref 0.0–40.0)

## 2021-10-01 LAB — HEPATIC FUNCTION PANEL
ALT: 11 U/L (ref 0–53)
AST: 11 U/L (ref 0–37)
Albumin: 4.2 g/dL (ref 3.5–5.2)
Alkaline Phosphatase: 68 U/L (ref 39–117)
Bilirubin, Direct: 0.1 mg/dL (ref 0.0–0.3)
Total Bilirubin: 0.4 mg/dL (ref 0.2–1.2)
Total Protein: 7.1 g/dL (ref 6.0–8.3)

## 2021-10-01 LAB — PSA: PSA: 13.53 ng/mL — ABNORMAL HIGH (ref 0.10–4.00)

## 2021-10-01 LAB — VITAMIN D 25 HYDROXY (VIT D DEFICIENCY, FRACTURES): VITD: 70.47 ng/mL (ref 30.00–100.00)

## 2021-10-08 ENCOUNTER — Ambulatory Visit (INDEPENDENT_AMBULATORY_CARE_PROVIDER_SITE_OTHER): Payer: Medicare HMO | Admitting: Internal Medicine

## 2021-10-08 VITALS — BP 120/74 | HR 75 | Temp 98.6°F | Ht 69.0 in | Wt 175.0 lb

## 2021-10-08 DIAGNOSIS — E559 Vitamin D deficiency, unspecified: Secondary | ICD-10-CM

## 2021-10-08 DIAGNOSIS — R972 Elevated prostate specific antigen [PSA]: Secondary | ICD-10-CM

## 2021-10-08 DIAGNOSIS — E1165 Type 2 diabetes mellitus with hyperglycemia: Secondary | ICD-10-CM | POA: Diagnosis not present

## 2021-10-08 DIAGNOSIS — E7800 Pure hypercholesterolemia, unspecified: Secondary | ICD-10-CM

## 2021-10-08 DIAGNOSIS — E538 Deficiency of other specified B group vitamins: Secondary | ICD-10-CM | POA: Diagnosis not present

## 2021-10-08 MED ORDER — METFORMIN HCL ER 500 MG PO TB24: ORAL_TABLET | ORAL | 3 refills | Status: DC

## 2021-10-08 MED ORDER — DAPAGLIFLOZIN PROPANEDIOL 5 MG PO TABS: 5.0000 mg | ORAL_TABLET | Freq: Every day | ORAL | 3 refills | Status: DC

## 2021-10-08 NOTE — Patient Instructions (Addendum)
Please have your Shingrix (shingles) shots done at your local pharmacy.  Please take all new medication as prescribed - the farxiga 5 mg per day  Please continue all other medications as before, including the metformin at 2 pills per day   Please have the pharmacy call with any other refills you may need.  Please continue your efforts at being more active, low cholesterol diet, and weight control.  Please keep your appointments with your specialists as you may have planned  Please make an Appointment to return in 6 months, or sooner if needed, also with Lab Appointment for testing done 3-5 days before at the [facility] (so this is for TWO appointments - please see the scheduling desk as you leave)

## 2021-10-08 NOTE — Progress Notes (Unsigned)
Patient ID: Vincent [name], male   DOB: [birth year], 77 y.o.   MRN: [id number]        Chief Complaint: follow up HTN, HLD, DM, elevated PSA       HPI:  Vincent [name] is a 76 y.o. male overall doing ok.  Pt denies chest pain, increased sob or doe, wheezing, orthopnea, PND, increased LE swelling, palpitations, dizziness or syncope.   Pt denies polydipsia, polyuria, or new focal neuro s/s.   Pt denies fever, wt loss, night sweats, loss of appetite, or other constitutional symptoms  Pt is s/p prostate bx x 2, has f/u with urology in 3 wks, Denies urinary symptoms such as dysuria, frequency, urgency, flank pain, hematuria or n/v, fever, chills.          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   Chronic LBP 02/08/2011   DDD (degenerative disc disease), cervical 04/11/2011   Diabetes mellitus type 2, diet-controlled ([place])    on meds   Diverticulosis    Elevated PSA 02/08/2011   GERD (gastroesophageal reflux disease) 07/25/7626   with certain foods-on meds   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/2011   pt unaware   History of colon polyps    History of kidney stones    History of prostatitis 04/11/2011   Hyperlipidemia    on meds   Hypertension    on meds   Lumbar disc disease 02/06/2011   Vitamin D deficiency    Wears partial dentures    upper and lower   Past Surgical History:  Procedure Laterality Date   BACK SURGERY     x 3  L4-L5   COLONOSCOPY  2007   Dr Wynetta Emery   COLONOSCOPY  2017    SA-MAC-suprep(good)-SSP x 1   CYST EXCISION     POLYPECTOMY     SPP x 1   THULIUM LASER TURP (TRANSURETHRAL RESECTION OF PROSTATE) N/A 10/27/2018   Procedure: THULIUM LASER TURP (TRANSURETHRAL RESECTION OF PROSTATE);  Surgeon: Festus Aloe, MD;  [facility];  Service: Urology;  Laterality: N/A;   WISDOM TOOTH EXTRACTION      reports that he quit smoking about 58 years ago. His smoking use included cigarettes. He has never used smokeless tobacco. He reports that he does not drink alcohol and does not use drugs. family history includes Blindness in an other family member; Diabetes in an other family member; Heart disease in an other family member; Hypertension in an other family member; Mental illness in an other family member; Sudden death in an other family member. No Known Allergies Current Outpatient Medications on File Prior to Visit  Medication Sig Dispense Refill   amLODipine (NORVASC) 5 MG tablet TAKE 1 TABLET EVERY DAY 90 tablet 1   aspirin 81 MG EC tablet Take 1 tablet (81 mg total) by mouth daily. Swallow whole. (Patient taking differently: Take 324 mg by  mouth daily. Swallow whole.) 30 tablet 12   Blood Glucose Monitoring Suppl (PRODIGY VOICE BLOOD GLUCOSE) w/Device KIT 1 each by Does not apply route 2 (two) times daily. Use as directed twice daily E11.9 1 kit 0   finasteride (PROSCAR) 5 MG tablet TK 1 T PO QHS 90 tablet 0   glucose blood test strip Use as instructed twice daily E11.9 300 each 3   Multiple Vitamin (MULTIVITAMIN PO) Take 1 tablet by mouth daily at 6 (six) AM.     Prodigy Safety Lancets 26G MISC 1 each by Does not apply route 2 (two) times daily. Use as directed twice daily E11.9 300 each 3   rosuvastatin (CRESTOR) 20 MG tablet TAKE 1 TABLET EVERY DAY (NEED MD APPOINTMENT) 90 tablet 1   tamsulosin (FLOMAX) 0.4 MG CAPS capsule Take 0.4 mg by mouth every evening.   8   No current facility-administered medications on file prior to visit.         ROS:  All others reviewed and negative.  Objective        PE:  BP 120/74 (BP Location: Left Arm, Patient Position: Sitting, Cuff Size: Large)   Pulse 75   Temp 98.6 F (37 C) (Oral)   Ht _0  (1.753 m)   Wt 175 lb (79.4 kg)   SpO2 95%   BMI 25.84 kg/m                 Constitutional: Pt appears in NAD               HENT: Head: NCAT.                Right Ear: External ear normal.                 Left Ear: External ear normal.                Eyes: . Pupils are equal, round, and reactive to light. Conjunctivae and EOM are normal               Nose: without d/c or deformity               Neck: Neck supple. Gross normal ROM               Cardiovascular: Normal rate and regular rhythm.                 Pulmonary/Chest: Effort normal and breath sounds without rales or wheezing.                Abd:  Soft, NT, ND, + BS, no organomegaly               Neurological: Pt is alert. At baseline orientation, motor grossly intact               Skin: Skin is warm. No rashes, no other new lesions, LE edema - none               Psychiatric: Pt behavior is normal without agitation   Micro: none  Cardiac tracings I have personally interpreted today:  none  Pertinent Radiological findings (summarize): none   Lab Results  Component Value Date   WBC 5.9 02/17/2021   HGB 13.9 02/17/2021   HCT 43.9 02/17/2021   PLT 217.0 02/17/2021   GLUCOSE 105 (H) 10/01/2021   CHOL 102 10/01/2021   TRIG 47.0 10/01/2021   HDL 51.20 10/01/2021   LDLCALC 41 10/01/2021  ALT 11 10/01/2021   AST 11 10/01/2021   NA 137 10/01/2021   K 4.1 10/01/2021   CL 104 10/01/2021   CREATININE 1.14 10/01/2021   BUN 12 10/01/2021   CO2 26 10/01/2021   TSH 1.54 02/17/2021   PSA 13.53 (H) 10/01/2021   HGBA1C 7.2 (H) 10/01/2021   MICROALBUR 8.1 (H) 02/17/2021   Assessment/Plan:  Vincent [name] is a 76 y.o. Black or African American [2] male with  has a past medical history of Allergic rhinitis, cause unspecified  (02/08/2011), Anemia, unspecified (04/11/2011), Arthritis, Blindness (02/06/2011), BPH (benign prostatic hypertrophy) (02/08/2011), Cataract, Chronic LBP (02/08/2011), DDD (degenerative disc disease), cervical (04/11/2011), Diabetes mellitus type 2, diet-controlled ([place]), Diverticulosis, Elevated PSA (02/08/2011), GERD (gastroesophageal reflux disease) (02/08/2011), Hepatitis B antibody positive (04/11/2011), History of colon polyps, History of kidney stones, History of prostatitis (04/11/2011), Hyperlipidemia, Hypertension, Lumbar disc disease (02/06/2011), Vitamin D deficiency, and Wears partial dentures.  Diabetes ([place]) Lab Results  Component Value Date   HGBA1C 7.2 (H) 10/01/2021   uncontrolled, pt to increase metformin ER 500 - 2 qd, and add farxiga 5 qd   Elevated PSA Lab Results  Component Value Date   PSA 13.53 (H) 10/01/2021   PSA 10.51 (H) 02/17/2021   PSA 8.64 (H) 04/22/2020   conts to worsen - ? BPH vs other, has f/u urology in 3 wks  Hyperlipidemia Lab Results  Component Value Date   LDLCALC 41 10/01/2021   Stable, pt to continue current statin crestor 20 qd   Vitamin D deficiency Last vitamin D Lab Results  Component Value Date   VD25OH 70.47 10/01/2021   Stable, cont oral replacement  Followup: Return in about 6 months (around 04/08/2022).  [name], MD 10/10/2021 9:40 PM [facility]

## 2021-10-10 ENCOUNTER — Encounter: Payer: Self-pay | Admitting: Internal Medicine

## 2021-10-10 NOTE — Assessment & Plan Note (Signed)
Lab Results  Component Value Date   PSA 13.53 (H) 10/01/2021   PSA 10.51 (H) 02/17/2021   PSA 8.64 (H) 04/22/2020   conts to worsen - ? BPH vs other, has f/u urology in 3 wks

## 2021-10-10 NOTE — Assessment & Plan Note (Signed)
Lab Results  Component Value Date   LDLCALC 41 10/01/2021   Stable, pt to continue current statin crestor 20 qd

## 2021-10-10 NOTE — Assessment & Plan Note (Signed)
Last vitamin D Lab Results  Component Value Date   VD25OH 70.47 10/01/2021   Stable, cont oral replacement

## 2021-10-10 NOTE — Addendum Note (Signed)
Addended by: Biagio Borg on: 10/10/2021 09:43 PM   Modules accepted: Orders

## 2021-10-10 NOTE — Assessment & Plan Note (Signed)
Lab Results  Component Value Date   HGBA1C 7.2 (H) 10/01/2021   uncontrolled, pt to increase metformin ER 500 - 2 qd, and add farxiga 5 qd

## 2021-10-23 DIAGNOSIS — R972 Elevated prostate specific antigen [PSA]: Secondary | ICD-10-CM | POA: Diagnosis not present

## 2021-10-26 DIAGNOSIS — N400 Enlarged prostate without lower urinary tract symptoms: Secondary | ICD-10-CM | POA: Diagnosis not present

## 2021-10-26 DIAGNOSIS — R972 Elevated prostate specific antigen [PSA]: Secondary | ICD-10-CM | POA: Diagnosis not present

## 2021-10-26 DIAGNOSIS — N402 Nodular prostate without lower urinary tract symptoms: Secondary | ICD-10-CM | POA: Diagnosis not present

## 2021-11-13 ENCOUNTER — Other Ambulatory Visit: Payer: Self-pay | Admitting: Urology

## 2021-11-13 DIAGNOSIS — N402 Nodular prostate without lower urinary tract symptoms: Secondary | ICD-10-CM

## 2021-11-13 DIAGNOSIS — R972 Elevated prostate specific antigen [PSA]: Secondary | ICD-10-CM

## 2021-11-16 ENCOUNTER — Ambulatory Visit (INDEPENDENT_AMBULATORY_CARE_PROVIDER_SITE_OTHER): Payer: Medicare HMO | Admitting: Internal Medicine

## 2021-11-16 VITALS — BP 120/82 | HR 90 | Temp 98.2°F | Ht 69.0 in | Wt 179.1 lb

## 2021-11-16 DIAGNOSIS — E559 Vitamin D deficiency, unspecified: Secondary | ICD-10-CM | POA: Diagnosis not present

## 2021-11-16 DIAGNOSIS — E7800 Pure hypercholesterolemia, unspecified: Secondary | ICD-10-CM

## 2021-11-16 DIAGNOSIS — H6123 Impacted cerumen, bilateral: Secondary | ICD-10-CM | POA: Diagnosis not present

## 2021-11-16 DIAGNOSIS — E1165 Type 2 diabetes mellitus with hyperglycemia: Secondary | ICD-10-CM | POA: Diagnosis not present

## 2021-11-16 MED ORDER — IBUPROFEN 800 MG PO TABS: 800.0000 mg | ORAL_TABLET | Freq: Three times a day (TID) | ORAL | 1 refills | Status: DC | PRN

## 2021-11-16 NOTE — Progress Notes (Signed)
Patient ID: Vincent [name], male   DOB: October 13, 1945, 76 y.o.   MRN: [id number]        Chief Complaint: follow up bilateral hearing reduced with wax       HPI:  Vincent [name] is a 76 y.o. male here with c/o reduced hearing both ears in the past 1-2 wks, without HA, fever, ST, cough, or discharge.  Pt denies chest pain, increased sob or doe, wheezing, orthopnea, PND, increased LE swelling, palpitations, dizziness or syncope.   Pt denies polydipsia, polyuria, or new focal neuro s/s.    Pt denies fever, wt loss, night sweats, loss of appetite, or other constitutional symptoms         Wt Readings from Last 3 Encounters:  11/16/21 179 lb 2 oz (81.3 kg)  10/08/21 175 lb (79.4 kg)  09/30/21 172 lb 6.4 oz (78.2 kg)   BP Readings from Last 3 Encounters:  11/16/21 120/82  10/08/21 120/74  05/27/21 139/85         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, blind since 53 or 64 yrs old   Chronic LBP 02/08/2011   DDD (degenerative disc disease), cervical 04/11/2011   Diabetes mellitus type 2, diet-controlled ([place])    on meds   Diverticulosis    Elevated PSA 02/08/2011   GERD (gastroesophageal reflux disease) 78/29/5621   with certain foods-on meds   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/2011   pt unaware   History of colon polyps    History of kidney stones    History of prostatitis 04/11/2011   Hyperlipidemia    on meds   Hypertension    on meds   Lumbar disc disease 02/06/2011   Vitamin D deficiency    Wears partial dentures    upper and lower   Past Surgical History:  Procedure Laterality Date   BACK SURGERY     x 3  L4-L5   COLONOSCOPY  2007   Dr Wynetta Emery   COLONOSCOPY  2017   SA-MAC-suprep(good)-SSP x 1   CYST EXCISION     POLYPECTOMY     SPP x 1    THULIUM LASER TURP (TRANSURETHRAL RESECTION OF PROSTATE) N/A 10/27/2018   Procedure: THULIUM LASER TURP (TRANSURETHRAL RESECTION OF PROSTATE);  Surgeon: Festus Aloe, MD;  Location: [facility];  Service: Urology;  Laterality: N/A;   WISDOM TOOTH EXTRACTION      reports that he quit smoking about 58 years ago. His smoking use included cigarettes. He has never used smokeless tobacco. He reports that he does not drink alcohol and does not use drugs. family history includes Blindness in an other family member; Diabetes in an other family member; Heart disease in an other family member; Hypertension in an other family member; Mental illness in an other family member; Sudden death in an other family member. No Known Allergies Current Outpatient Medications on File Prior to Visit  Medication Sig Dispense Refill   amLODipine (NORVASC) 5 MG tablet TAKE 1 TABLET EVERY DAY 90 tablet 1   aspirin 81 MG EC tablet Take 1 tablet (81 mg total) by mouth daily. Swallow whole. (Patient taking differently: Take 324 mg by mouth daily. Swallow whole.) 30 tablet 12   Blood Glucose Monitoring Suppl (PRODIGY  VOICE BLOOD GLUCOSE) w/Device KIT 1 each by Does not apply route 2 (two) times daily. Use as directed twice daily E11.9 1 kit 0   dapagliflozin propanediol (FARXIGA) 5 MG TABS tablet Take 1 tablet (5 mg total) by mouth daily before breakfast. 90 tablet 3   finasteride (PROSCAR) 5 MG tablet TK 1 T PO QHS 90 tablet 0   glucose blood test strip Use as instructed twice daily E11.9 300 each 3   metFORMIN (GLUCOPHAGE-XR) 500 MG 24 hr tablet TAKE 2 TABLET BY MOUTH EVERY DAY WITH BREAKFAST 180 tablet 3   Multiple Vitamin (MULTIVITAMIN PO) Take 1 tablet by mouth daily at 6 (six) AM.     Prodigy Safety Lancets 26G MISC 1 each by Does not apply route 2 (two) times daily. Use as directed twice daily E11.9 300 each 3   rosuvastatin (CRESTOR) 20 MG tablet TAKE 1 TABLET EVERY DAY (NEED MD APPOINTMENT) 90 tablet  1   tamsulosin (FLOMAX) 0.4 MG CAPS capsule Take 0.4 mg by mouth every evening.   8   No current facility-administered medications on file prior to visit.        ROS:  All others reviewed and negative.  Objective        PE:  BP 120/82 (BP Location: Right Arm, Patient Position: Sitting, Cuff Size: Normal)   Pulse 90   Temp 98.2 F (36.8 C) (Oral)   Ht _0  (1.753 m)   Wt 179 lb 2 oz (81.3 kg)   SpO2 97%   BMI 26.45 kg/m                 Constitutional: Pt appears in NAD               HENT: Head: NCAT.                Right Ear: External ear normal.                 Left Ear: External ear normal.  Bilateral wax impactions resolved with irrigation               Eyes: . Pupils are equal, round, and reactive to light. Conjunctivae and EOM are normal               Nose: without d/c or deformity               Neck: Neck supple. Gross normal ROM               Cardiovascular: Normal rate and regular rhythm.                 Pulmonary/Chest: Effort normal and breath sounds without rales or wheezing.                               Neurological: Pt is alert. At baseline orientation, motor grossly intact               Skin: Skin is warm. No rashes, no other new lesions, LE edema - none               Psychiatric: Pt behavior is normal without agitation   Micro: none  Cardiac tracings I have personally interpreted today:  none  Pertinent Radiological findings (summarize): none   Lab Results  Component Value Date   WBC 5.9 02/17/2021   HGB 13.9 02/17/2021   HCT 43.9 02/17/2021  PLT 217.0 02/17/2021   GLUCOSE 105 (H) 10/01/2021   CHOL 102 10/01/2021   TRIG 47.0 10/01/2021   HDL 51.20 10/01/2021   LDLCALC 41 10/01/2021   ALT 11 10/01/2021   AST 11 10/01/2021   NA 137 10/01/2021   K 4.1 10/01/2021   CL 104 10/01/2021   CREATININE 1.14 10/01/2021   BUN 12 10/01/2021   CO2 26 10/01/2021   TSH 1.54 02/17/2021   PSA 13.53 (H) 10/01/2021   HGBA1C 7.2 (H) 10/01/2021   MICROALBUR 8.1  (H) 02/17/2021   Assessment/Plan:  [name] is a 76 y.o. Black or African American [2] male with  has a past medical history of Allergic rhinitis, cause unspecified (02/08/2011), Anemia, unspecified (04/11/2011), Arthritis, Blindness (02/06/2011), BPH (benign prostatic hypertrophy) (02/08/2011), Cataract, Chronic LBP (02/08/2011), DDD (degenerative disc disease), cervical (04/11/2011), Diabetes mellitus type 2, diet-controlled ([place]), Diverticulosis, Elevated PSA (02/08/2011), GERD (gastroesophageal reflux disease) (02/08/2011), Hepatitis B antibody positive (04/11/2011), History of colon polyps, History of kidney stones, History of prostatitis (04/11/2011), Hyperlipidemia, Hypertension, Lumbar disc disease (02/06/2011), Vitamin D deficiency, and Wears partial dentures.  Vitamin D deficiency Last vitamin D Lab Results  Component Value Date   VD25OH 70.47 10/01/2021     Diabetes ([place]) Lab Results  Component Value Date   HGBA1C 7.2 (H) 10/01/2021   Uncontrolled, pt to continue current medical treatment as is tolerating new farxiga 5 mg daily and metformin, for f/u lab next visit   Hearing loss due to cerumen impaction, bilateral Ceruminosis is noted.  Wax is removed by syringing and manual debridement. Instructions for home care to prevent wax buildup are given.   Hyperlipidemia Lab Results  Component Value Date   LDLCALC 41 10/01/2021   Stable, pt to continue current statin crestor 20 mg qd  Followup: No follow-ups on file.  [name], MD 11/16/2021 8:37 PM [facility]

## 2021-11-16 NOTE — Assessment & Plan Note (Signed)
Lab Results  Component Value Date   LDLCALC 41 10/01/2021   Stable, pt to continue current statin crestor 20 mg qd

## 2021-11-16 NOTE — Assessment & Plan Note (Addendum)
Lab Results  Component Value Date   HGBA1C 7.2 (H) 10/01/2021   Uncontrolled, pt to continue current medical treatment as is tolerating new farxiga 5 mg daily and metformin, for f/u lab next visit

## 2021-11-16 NOTE — Patient Instructions (Addendum)
Your ear canals were irrigated and wax removed today  Please continue all other medications as before, and refills have been done if requested.  Please have the pharmacy call with any other refills you may need.  Please continue your efforts at being more active, low cholesterol diet, and weight control.  Please keep your appointments with your specialists as you may have planned  Please make an Appointment to return in 3 months, or sooner if needed

## 2021-11-16 NOTE — Assessment & Plan Note (Signed)
Last vitamin D Lab Results  Component Value Date   VD25OH 70.47 10/01/2021

## 2021-11-16 NOTE — Assessment & Plan Note (Signed)
Ceruminosis is noted.  Wax is removed by syringing and manual debridement. Instructions for home care to prevent wax buildup are given.  

## 2021-11-30 DIAGNOSIS — D2272 Melanocytic nevi of left lower limb, including hip: Secondary | ICD-10-CM | POA: Diagnosis not present

## 2021-11-30 DIAGNOSIS — L821 Other seborrheic keratosis: Secondary | ICD-10-CM | POA: Diagnosis not present

## 2021-11-30 DIAGNOSIS — D225 Melanocytic nevi of trunk: Secondary | ICD-10-CM | POA: Diagnosis not present

## 2021-12-04 ENCOUNTER — Ambulatory Visit
Admission: RE | Admit: 2021-12-04 | Discharge: 2021-12-04 | Disposition: A | Discharge status: Home / Self Care | Payer: Medicare HMO | Source: Ambulatory Visit | Attending: Urology | Admitting: Urology

## 2021-12-04 DIAGNOSIS — R972 Elevated prostate specific antigen [PSA]: Secondary | ICD-10-CM | POA: Diagnosis not present

## 2021-12-04 DIAGNOSIS — N402 Nodular prostate without lower urinary tract symptoms: Secondary | ICD-10-CM

## 2021-12-04 MED ORDER — GADOPICLENOL 0.5 MMOL/ML IV SOLN
8.0000 mL | Freq: Once | INTRAVENOUS | Status: AC | PRN
  Administered 2021-12-04: 8 mL via INTRAVENOUS

## 2021-12-28 ENCOUNTER — Other Ambulatory Visit: Payer: Self-pay | Admitting: Internal Medicine

## 2022-01-13 DIAGNOSIS — R972 Elevated prostate specific antigen [PSA]: Secondary | ICD-10-CM | POA: Diagnosis not present

## 2022-01-20 DIAGNOSIS — N402 Nodular prostate without lower urinary tract symptoms: Secondary | ICD-10-CM | POA: Diagnosis not present

## 2022-01-20 DIAGNOSIS — R972 Elevated prostate specific antigen [PSA]: Secondary | ICD-10-CM | POA: Diagnosis not present

## 2022-01-20 DIAGNOSIS — R8271 Bacteriuria: Secondary | ICD-10-CM | POA: Diagnosis not present

## 2022-01-20 DIAGNOSIS — N401 Enlarged prostate with lower urinary tract symptoms: Secondary | ICD-10-CM | POA: Diagnosis not present

## 2022-03-01 ENCOUNTER — Other Ambulatory Visit: Payer: Self-pay | Admitting: Internal Medicine

## 2022-03-01 NOTE — Telephone Encounter (Signed)
Please refill as per office routine med refill policy (all routine meds to be refilled for 3 mo or monthly (per pt preference) up to one year from last visit, then month to month grace period for 3 mo, then further med refills will have to be denied)

## 2022-03-18 DIAGNOSIS — D075 Carcinoma in situ of prostate: Secondary | ICD-10-CM | POA: Diagnosis not present

## 2022-03-18 DIAGNOSIS — C61 Malignant neoplasm of prostate: Secondary | ICD-10-CM | POA: Diagnosis not present

## 2022-03-24 DIAGNOSIS — C61 Malignant neoplasm of prostate: Secondary | ICD-10-CM | POA: Diagnosis not present

## 2022-03-29 ENCOUNTER — Ambulatory Visit
Admission: RE | Admit: 2022-03-29 | Discharge: 2022-03-29 | Disposition: A | Discharge status: Home / Self Care | Payer: Self-pay | Source: Ambulatory Visit | Attending: Radiation Oncology | Admitting: Radiation Oncology

## 2022-03-29 ENCOUNTER — Other Ambulatory Visit: Payer: Self-pay | Admitting: Radiation Oncology

## 2022-03-29 DIAGNOSIS — C61 Malignant neoplasm of prostate: Secondary | ICD-10-CM

## 2022-03-31 ENCOUNTER — Encounter: Payer: Self-pay | Admitting: Radiation Oncology

## 2022-03-31 NOTE — Progress Notes (Signed)
GU Location of Tumor / Histology: Prostate Ca  If Prostate Cancer, Gleason Score is (4 + 3) and PSA is (13.9 on 01/2022)  Biopsies       03/06/2021 Dr. Jiles Crocker CT Abdomen and Pelvis without/with Contrast Clinical Data:  Abnormal filling defects on bladder seen on lumbar spine MRI.  Impression: Marked enlargement of the prostate which indents the bladder, associated bladder wall thickening, indicative of an element of outlet obstruction. Aortic atherosclerosis.   12/04/2021 Dr. Festus Aloe MR Prostate with/without Contrast CLINICAL DATA:  Elevated PSA level of 11.90 on 10/23/2021.  R97.20  FINDINGS: Prostate: Encapsulated nodularity in the transition zone compatible with benign prostatic hypertrophy. Prior TURP.   Hazy low T2 signal in the peripheral zone is nonfocal, likely postinflammatory, and is considered PI-RADS category 2.   Region of interest # 1: PI-RADS category 5 lesion of the left posterolateral, anterior, and posteromedial peripheral zone of the apex, with focally reduced T2 signal (image 67 of series 9) corresponding to prominently reduced ADC map activity and restricted diffusion (image 23 of series 6 and 7) and regional early enhancement (image 160 of series 12). The diffusion-weighted restriction seems larger than the low T2 signal in this vicinity. Unfortunately the very apex of the prostate gland was excluded on the UroNAV images, but on coronal images I do not observe definite extraprostatic spread. On T2 weighted images this lesion measures 0.89 cc (2.0 by 1.0 by 0.8 cm).   Volume: 3D volumetric analysis: Prostate volume 92.35 cc (6.1 by 5.3 by 6.2 cm).   Transcapsular spread:  Absent   Seminal vesicle involvement: Absent   Neurovascular bundle involvement: Absent   Pelvic adenopathy: Absent   Bone metastasis: Absent   Other findings: No other significant findings.   IMPRESSION: 1. PI-RADS category 5 lesion of the left peripheral zone at  the apex. Targeting data sent to [place]. 2. Prostatomegaly and benign prostatic hypertrophy. Prior TURP.     Past/Anticipated interventions by urology, if any:     Past/Anticipated interventions by medical oncology, if any:  NA  Weight changes, if any:  No  IPSS:  3 SHIM:  8  Bowel/Bladder complaints, if any:  No  Nausea/Vomiting, if any: No  Pain issues, if any:  0/10  SAFETY ISSUES: Prior radiation?  No Pacemaker/ICD? No Possible current pregnancy? Male Is the patient on methotrexate? No  Current Complaints / other details:

## 2022-04-08 ENCOUNTER — Ambulatory Visit: Payer: Medicare HMO | Admitting: Radiation Oncology

## 2022-04-08 ENCOUNTER — Ambulatory Visit: Payer: Medicare HMO

## 2022-04-09 ENCOUNTER — Ambulatory Visit
Admission: RE | Admit: 2022-04-09 | Discharge: 2022-04-09 | Disposition: A | Discharge status: Home / Self Care | Payer: Medicare HMO | Source: Ambulatory Visit | Attending: Radiation Oncology | Admitting: Radiation Oncology

## 2022-04-09 ENCOUNTER — Encounter: Payer: Self-pay | Admitting: Radiation Oncology

## 2022-04-09 ENCOUNTER — Other Ambulatory Visit: Payer: Self-pay

## 2022-04-09 ENCOUNTER — Encounter: Payer: Self-pay | Admitting: Urology

## 2022-04-09 VITALS — Ht 69.0 in | Wt 172.0 lb

## 2022-04-09 DIAGNOSIS — C61 Malignant neoplasm of prostate: Secondary | ICD-10-CM

## 2022-04-09 DIAGNOSIS — Z191 Hormone sensitive malignancy status: Secondary | ICD-10-CM | POA: Diagnosis not present

## 2022-04-09 NOTE — Progress Notes (Signed)
Radiation Oncology         [phone] ________________________________  Initial Outpatient Consultation - Conducted via Telephone due to current COVID-19 concerns for limiting patient exposure  Name: Vincent [name] MRN: [id number]  Date: 04/09/2022  DOB: 1945-07-21  [id number], Hunt Oris, MD  Festus Aloe, MD   REFERRING PHYSICIAN: Festus Aloe, MD  DIAGNOSIS: 77 y.o. gentleman with Stage T2a adenocarcinoma of the prostate with Gleason score of 4+3, and PSA of 27.8 (adjusted for finasteride).    ICD-10-CM   1. Malignant neoplasm of prostate ([place])  C61       HISTORY OF PRESENT ILLNESS: Vincent [name] is a 77 y.o. male with a diagnosis of prostate cancer. He has been followed by [facility] since at least 2008 for BPH with LUTS and an elevated PSA. His LUTS are managed on tamsulosin and finasteride since at least 2017. He previously underwent numerous prostate biopsies-- in 01/2007, 12/2007, 01/2009, 04/2009, and 08/2011, all of which were benign with the exception of one that showed atypia but no definite malignancy. He had a TURP in 2020, again showing benign pathology. His PSA continued to gradually rise, despite finasteride and at a routine follow up visit on 10/26/21, he was noted to have a new 10 mm left apex prostate nodule on digital rectal examination. A PSA obtained that day had increased to 11.9 (23.8 adjusted for finasteride). This prompted a prostate MRI on 12/04/21 that showed a PI-RADS 5 lesion in the left peripheral zone at the apex. A repeat PSA on 01/14/22 had further increased to 13.9 (27.8 adjusted for finasteride).  The patient proceeded to MRI fusion biopsy of the prostate on 03/18/22.  The prostate volume measured 108.42 cc.  Out of 15 core biopsies, only 1 was positive.  The maximum Gleason score was 4+3, and this was seen in the left apex lateral.  The patient reviewed the biopsy results with his urologist and he has kindly been referred today for  discussion of potential radiation treatment options.   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   Chronic LBP 02/08/2011   DDD (degenerative disc disease), cervical 04/11/2011   Diabetes mellitus type 2, diet-controlled ([place])    on meds   Diverticulosis    Elevated PSA 02/08/2011   GERD (gastroesophageal reflux disease) 99991111   with certain foods-on meds   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/2011   pt unaware   History of colon polyps    History of kidney stones    History of prostatitis 04/11/2011   Hyperlipidemia    on meds   Hypertension    on meds   Lumbar disc disease 02/06/2011   Vitamin D deficiency    Wears partial dentures    upper and lower      PAST SURGICAL HISTORY: Past Surgical History:  Procedure Laterality Date   BACK SURGERY     x 3  L4-L5   COLONOSCOPY  2007   Dr Wynetta Emery   COLONOSCOPY  2017   SA-MAC-suprep(good)-SSP x 1   CYST EXCISION     POLYPECTOMY     SPP x 1   PROSTATE BIOPSY  THULIUM LASER TURP (TRANSURETHRAL RESECTION OF PROSTATE) N/A 10/27/2018   Procedure: THULIUM LASER TURP (TRANSURETHRAL RESECTION OF PROSTATE);  Surgeon: Festus Aloe, MD;  Location: [facility];  Service: Urology;  Laterality: N/A;   WISDOM TOOTH EXTRACTION      FAMILY HISTORY:  Family History  Problem Relation Age of Onset   Diabetes Mother    Kidney failure Mother    Kidney failure Brother    Diabetes Brother    Blindness Other    Heart disease Other    Hypertension Other    Diabetes Other    Mental illness Other    Sudden death Other    Colon cancer Neg Hx    Esophageal cancer Neg Hx    Stomach cancer Neg Hx    Rectal cancer  Neg Hx    Colon polyps Neg Hx     SOCIAL HISTORY:  Social History   Socioeconomic History   Marital status: Married    Spouse name: Not on file   Number of children: Not on file   Years of education: Not on file   Highest education level: Not on file  Occupational History   Not on file  Tobacco Use   Smoking status: Former    Types: Cigarettes    Quit date: 05/21/1963    Years since quitting: 58.9   Smokeless tobacco: Never  Vaping Use   Vaping Use: Never used  Substance and Sexual Activity   Alcohol use: No    Alcohol/week: 0.0 standard drinks of alcohol   Drug use: No   Sexual activity: Yes  Other Topics Concern   Not on file  Social History Narrative   Not on file   Social Determinants of Health   Financial Resource Strain: Low Risk  (10/28/2017)   Overall Financial Resource Strain (CARDIA)    Difficulty of Paying Living Expenses: Not hard at all  Food Insecurity: No Food Insecurity (04/09/2022)   Hunger Vital Sign    Worried About Running Out of Food in the Last Year: Never true    Ran Out of Food in the Last Year: Never true  Transportation Needs: No Transportation Needs (04/09/2022)   PRAPARE - Hydrologist (Medical): No    Lack of Transportation (Non-Medical): No  Physical Activity: Sufficiently Active (09/30/2021)   Exercise Vital Sign    Days of Exercise per Week: 5 days    Minutes of Exercise per Session: 30 min  Stress: No Stress Concern Present (09/30/2021)   [place]    Feeling of Stress : Not at all  Social Connections: [place] (09/30/2021)   Social Connection and Isolation Panel [NHANES]    Frequency of Communication with Friends and Family: More than three times a week    Frequency of Social Gatherings with Friends and Family: More than three times a week    Attends Religious Services: More than 4 times per year    Active Member of [employer] or  Organizations: Yes    Attends Archivist Meetings: More than 4 times per year    Marital Status: Married  Human resources officer Violence: Not At Risk (04/09/2022)   Humiliation, Afraid, Rape, and Kick questionnaire    Fear of Current or Ex-Partner: No    Emotionally Abused: No    Physically Abused: No    Sexually Abused: No    ALLERGIES: Patient has no known allergies.  MEDICATIONS:  Current Outpatient  Medications  Medication Sig Dispense Refill   amLODipine (NORVASC) 5 MG tablet TAKE 1 TABLET EVERY DAY 90 tablet 3   aspirin 81 MG EC tablet Take 1 tablet (81 mg total) by mouth daily. Swallow whole. (Patient taking differently: Take 324 mg by mouth daily. Swallow whole.) 30 tablet 12   Blood Glucose Monitoring Suppl (PRODIGY VOICE BLOOD GLUCOSE) w/Device KIT 1 each by Does not apply route 2 (two) times daily. Use as directed twice daily E11.9 1 kit 0   COVID-19 mRNA Virus Vaccine (SPIKEVAX IM) SMARTSIG:IM     dapagliflozin propanediol (FARXIGA) 5 MG TABS tablet Take 1 tablet (5 mg total) by mouth daily before breakfast. (Patient not taking: Reported on 04/09/2022) 90 tablet 3   finasteride (PROSCAR) 5 MG tablet TK 1 T PO QHS 90 tablet 0   FLUAD QUADRIVALENT 0.5 ML injection      glucose blood test strip Use as instructed twice daily E11.9 300 each 3   metFORMIN (GLUCOPHAGE-XR) 500 MG 24 hr tablet TAKE 2 TABLETS(1000 MG) BY MOUTH DAILY WITH BREAKFAST 180 tablet 1   Multiple Vitamin (MULTIVITAMIN PO) Take 1 tablet by mouth daily at 6 (six) AM.     Prodigy Safety Lancets 26G MISC 1 each by Does not apply route 2 (two) times daily. Use as directed twice daily E11.9 300 each 3   rosuvastatin (CRESTOR) 20 MG tablet TAKE 1 TABLET EVERY DAY (NEED MD APPOINTMENT) 90 tablet 3   tamsulosin (FLOMAX) 0.4 MG CAPS capsule Take 0.4 mg by mouth every evening.   8   No current facility-administered medications for this encounter.    REVIEW OF SYSTEMS:  On review of systems, the patient reports that he  is doing well overall. He denies any chest pain, shortness of breath, cough, fevers, chills, night sweats, unintended weight changes. He denies any bowel disturbances, and denies abdominal pain, nausea or vomiting. He denies any new musculoskeletal or joint aches or pains. His IPSS was 3, indicating mild urinary symptoms. His SHIM was 8, indicating he has moderate-severe erectile dysfunction. A complete review of systems is obtained and is otherwise negative.    PHYSICAL EXAM:  Wt Readings from Last 3 Encounters:  04/09/22 172 lb (78 kg)  11/16/21 179 lb 2 oz (81.3 kg)  10/08/21 175 lb (79.4 kg)   Temp Readings from Last 3 Encounters:  11/16/21 98.2 F (36.8 C) (Oral)  10/08/21 98.6 F (37 C) (Oral)  04/07/21 98.2 F (36.8 C) (Oral)   BP Readings from Last 3 Encounters:  11/16/21 120/82  10/08/21 120/74  05/27/21 139/85   Pulse Readings from Last 3 Encounters:  11/16/21 90  10/08/21 75  05/27/21 97   Pain Assessment Pain Score: 0-No pain/10  Physical exam not performed in light of telephone consult visit format.   KPS = 100  100 - Normal; no complaints; no evidence of disease. 90   - Able to carry on normal activity; minor signs or symptoms of disease. 80   - Normal activity with effort; some signs or symptoms of disease. 65   - Cares for self; unable to carry on normal activity or to do active work. 60   - Requires occasional assistance, but is able to care for most of his personal needs. 50   - Requires considerable assistance and frequent medical care. 60   - Disabled; requires special care and assistance. 29   - Severely disabled; hospital admission is indicated although death not imminent. 20   - Very  sick; hospital admission necessary; active supportive treatment necessary. 10   - Moribund; fatal processes progressing rapidly. 0     - Dead  Karnofsky DA, Abelmann [place], Craver LS and Burchenal [facility] [phone]) The use of the nitrogen mustards in the palliative treatment of  carcinoma: with particular reference to bronchogenic carcinoma Cancer 1 [phone]  LABORATORY DATA:  Lab Results  Component Value Date   WBC 5.9 02/17/2021   HGB 13.9 02/17/2021   HCT 43.9 02/17/2021   MCV 86.0 02/17/2021   PLT 217.0 02/17/2021   Lab Results  Component Value Date   NA 137 10/01/2021   K 4.1 10/01/2021   CL 104 10/01/2021   CO2 26 10/01/2021   Lab Results  Component Value Date   ALT 11 10/01/2021   AST 11 10/01/2021   ALKPHOS 68 10/01/2021   BILITOT 0.4 10/01/2021     RADIOGRAPHY: No results found.    IMPRESSION/PLAN: This visit was conducted via Telephone to spare the patient unnecessary potential exposure in the healthcare setting during the current COVID-19 pandemic. 1. 77 y.o. gentleman with Stage T2a adenocarcinoma of the prostate with Gleason Score of 4+3, and PSA of 27.8 (adjusted for finasteride). We discussed the patient's workup and outlined the nature of prostate cancer in this setting. The patient's T stage, Gleason's score, and PSA put him into the high risk group although, with a very large volume prostate, we suspect the BPH is responsible for the majority of the PSA. Accordingly, he is eligible for a variety of potential treatment options including brachytherapy, 5.5 weeks of external radiation, or prostatectomy. We discussed the available radiation techniques, and focused on the details and logistics and delivery. The patient is not a candidate for brachytherapy with a prostate volume of over 100 cc. Therefore, we discussed and outlined the risks, benefits, short and long-term effects associated with daily external beam radiotherapy and compared and contrasted these with prostatectomy. We discussed the role of SpaceOAR in reducing the rectal toxicity associated with radiotherapy.  He was encouraged to ask questions that were answered to his stated satisfaction.  At the end of the conversation the patient is interested in moving forward with the  recommended 5.5 week course of daily external beam radiation. He will need assistance with transportation to and from treatments so we will connect him with the transportation team here at the cancer center. We will share our discussion with Dr. Junious Silk and make arrangements for fiducial markers and SpaceOAR gel placement, first available, prior to simulation, to reduce rectal toxicity from radiotherapy. The patient appears to have a good understanding of his disease and our treatment recommendations which are of curative intent and is in agreement with the stated plan.  Therefore, we will move forward with treatment planning accordingly, in anticipation of beginning IMRT in the near future. We enjoyed meeting him today and look forward to continuing to participate in his care.  Given current concerns for patient exposure during the COVID-19 pandemic, this encounter was conducted via telephone. The patient was notified in advance and was offered a MyChart meeting to allow for face to face communication but unfortunately reported that he did not have the appropriate resources/technology to support such a visit and instead preferred to proceed with telephone consult. The patient has given verbal consent for this type of encounter. The time spent during this encounter was 60 minutes. The attendants for this meeting include Tyler Pita MD, Bahar Shelden PA-C, and patient Barnes Schonberger  During the encounter, Rodman Key  Tammi Klippel MD and Freeman Caldron PA-C were located at [facility] Radiation Oncology Department.  Patient Vinson Simonetti was located at home.    Nicholos Johns, PA-C    Tyler Pita, MD  [facility] Direct Dial: [phone]  Fax: [phone] [email or web address]  Skype  [email or web address]   This document serves as a record of services personally performed by Tyler Pita, MD and Freeman Caldron, PA-C. It was created on their behalf by Wilburn Mylar, a trained medical scribe. The creation of this record is based on the scribe's personal observations and the provider's statements to them. This document has been checked and approved by the attending provider.

## 2022-04-10 DIAGNOSIS — Z191 Hormone sensitive malignancy status: Secondary | ICD-10-CM | POA: Diagnosis not present

## 2022-04-10 DIAGNOSIS — C61 Malignant neoplasm of prostate: Secondary | ICD-10-CM | POA: Diagnosis not present

## 2022-04-12 ENCOUNTER — Telehealth: Payer: Self-pay | Admitting: *Deleted

## 2022-04-12 ENCOUNTER — Other Ambulatory Visit: Payer: Self-pay | Admitting: Urology

## 2022-04-12 NOTE — Telephone Encounter (Signed)
Called patient to inform of fid. marker and space oar placement on 05-26-22 and his sim on 05-28-22- arrival time- 2:45 pm @ [facility],informed patient to arrive with a full blader, lvm for a return call

## 2022-04-12 NOTE — Progress Notes (Signed)
Spoke with patient via telephone to introduce myself as the prostate nurse navigator and discussed my role.  Patient has decided to proceed with 5.5 weeks of daily radiation and could benefit from transportation services.  RN explained process and will initiate referral.  I provided my contact information and asked for him to call me with questions or concerns.  Verbalized understanding.

## 2022-04-14 ENCOUNTER — Telehealth: Payer: Self-pay | Admitting: *Deleted

## 2022-04-14 NOTE — Telephone Encounter (Signed)
RETURNED PATIENT'S PHONE CALL, SPOKE WITH PATIENT. ?

## 2022-04-15 NOTE — Progress Notes (Signed)
Request for transportation services sent to coordinator with anticipation of services beginning on 05/28/2022 for his CT Simulation.

## 2022-04-27 ENCOUNTER — Other Ambulatory Visit (HOSPITAL_COMMUNITY): Payer: Self-pay | Admitting: Urology

## 2022-04-27 DIAGNOSIS — C61 Malignant neoplasm of prostate: Secondary | ICD-10-CM

## 2022-05-12 ENCOUNTER — Other Ambulatory Visit: Payer: Self-pay | Admitting: Internal Medicine

## 2022-05-17 ENCOUNTER — Encounter (HOSPITAL_BASED_OUTPATIENT_CLINIC_OR_DEPARTMENT_OTHER): Payer: Self-pay | Admitting: Urology

## 2022-05-17 NOTE — Progress Notes (Signed)
Spoke w/ via phone for pre-op interview--- patient  Lab needs dos---- Istat, EKG, CBG              Lab results------ COVID test -----patient states asymptomatic no test needed Arrive at ------- 0600 on 05/26/22 NPO after MN NO Solid Food.  Clear liquids from MN until--- 0500 on 05/26/22 Med rec completed Medications to take morning of surgery ----- none Diabetic medication ----- HOLD metformin DOS Patient instructed no nail polish to be worn day of surgery Patient instructed to bring photo id and insurance card day of surgery Patient aware to have Driver (ride ) / caregiver    for 24 hours after surgery - Sion Ginsburg (wife) [phone] Patient Special Instructions ----- MD order for fleets enema night prior to surgery, pt and wife aware  Pre-Op special Istructions -----  Patient verbalized understanding of instructions that were given at this phone interview. Patient denies shortness of breath, chest pain, fever, cough at this phone interview.  Pt instructed by surgeon to stop ASA 81 mg 5 days prior to surgery. Advised pt to hold metformin morning of surgery, drink 4-6 oz apple juice if hypoglycemic before 0500. LOV with Dr. [place], cardiology, 05/27/21, echo 05/25/21 EF 55%, cardiologist cleared patient and instructed no further f/u needed, return prn. Pt denies CP/SOB.   Frances Furbish, RN

## 2022-05-26 ENCOUNTER — Ambulatory Visit (HOSPITAL_BASED_OUTPATIENT_CLINIC_OR_DEPARTMENT_OTHER)
Admission: RE | Admit: 2022-05-26 | Discharge: 2022-05-26 | Disposition: A | Discharge status: Home / Self Care | Payer: Medicare HMO | Attending: Urology | Admitting: Urology

## 2022-05-26 ENCOUNTER — Ambulatory Visit (HOSPITAL_BASED_OUTPATIENT_CLINIC_OR_DEPARTMENT_OTHER): Payer: Medicare HMO | Admitting: Anesthesiology

## 2022-05-26 ENCOUNTER — Telehealth: Payer: Self-pay | Admitting: *Deleted

## 2022-05-26 ENCOUNTER — Encounter (HOSPITAL_BASED_OUTPATIENT_CLINIC_OR_DEPARTMENT_OTHER): Payer: Self-pay | Admitting: Urology

## 2022-05-26 ENCOUNTER — Other Ambulatory Visit (HOSPITAL_COMMUNITY): Payer: Medicare HMO

## 2022-05-26 ENCOUNTER — Encounter (HOSPITAL_BASED_OUTPATIENT_CLINIC_OR_DEPARTMENT_OTHER)
Admission: RE | Disposition: A | Discharge status: Home / Self Care | Payer: Self-pay | Source: Home / Self Care | Attending: Urology

## 2022-05-26 ENCOUNTER — Other Ambulatory Visit: Payer: Self-pay

## 2022-05-26 DIAGNOSIS — Z87891 Personal history of nicotine dependence: Secondary | ICD-10-CM

## 2022-05-26 DIAGNOSIS — I10 Essential (primary) hypertension: Secondary | ICD-10-CM

## 2022-05-26 DIAGNOSIS — Z7984 Long term (current) use of oral hypoglycemic drugs: Secondary | ICD-10-CM | POA: Diagnosis not present

## 2022-05-26 DIAGNOSIS — E119 Type 2 diabetes mellitus without complications: Secondary | ICD-10-CM | POA: Diagnosis not present

## 2022-05-26 DIAGNOSIS — Z86718 Personal history of other venous thrombosis and embolism: Secondary | ICD-10-CM | POA: Diagnosis not present

## 2022-05-26 DIAGNOSIS — C61 Malignant neoplasm of prostate: Secondary | ICD-10-CM | POA: Insufficient documentation

## 2022-05-26 HISTORY — DX: Acute embolism and thrombosis of unspecified deep veins of unspecified lower extremity: I82.409

## 2022-05-26 HISTORY — PX: GOLD SEED IMPLANT: SHX6343

## 2022-05-26 HISTORY — PX: SPACE OAR INSTILLATION: SHX6769

## 2022-05-26 LAB — POCT I-STAT, CHEM 8
BUN: 15 mg/dL (ref 8–23)
Calcium, Ion: 1.38 mmol/L (ref 1.15–1.40)
Chloride: 104 mmol/L (ref 98–111)
Creatinine, Ser: 1.1 mg/dL (ref 0.61–1.24)
Glucose, Bld: 121 mg/dL — ABNORMAL HIGH (ref 70–99)
HCT: 44 % (ref 39.0–52.0)
Hemoglobin: 15 g/dL (ref 13.0–17.0)
Potassium: 4.3 mmol/L (ref 3.5–5.1)
Sodium: 142 mmol/L (ref 135–145)
TCO2: 29 mmol/L (ref 22–32)

## 2022-05-26 LAB — GLUCOSE, CAPILLARY: Glucose-Capillary: 132 mg/dL — ABNORMAL HIGH (ref 70–99)

## 2022-05-26 SURGERY — INSERTION, GOLD SEEDS
Anesthesia: Monitor Anesthesia Care | Site: Prostate

## 2022-05-26 MED ORDER — ONDANSETRON HCL 4 MG/2ML IJ SOLN
INTRAMUSCULAR | Status: DC | PRN
  Administered 2022-05-26: 4 mg via INTRAVENOUS

## 2022-05-26 MED ORDER — FENTANYL CITRATE (PF) 100 MCG/2ML IJ SOLN: 25.0000 ug | INTRAMUSCULAR | Status: DC | PRN

## 2022-05-26 MED ORDER — FENTANYL CITRATE (PF) 100 MCG/2ML IJ SOLN
INTRAMUSCULAR | Status: AC
  Filled 2022-05-26: qty 2

## 2022-05-26 MED ORDER — FLEET ENEMA 7-19 GM/118ML RE ENEM: 1.0000 | ENEMA | Freq: Once | RECTAL | Status: DC

## 2022-05-26 MED ORDER — LIDOCAINE HCL (PF) 2 % IJ SOLN
INTRAMUSCULAR | Status: AC
  Filled 2022-05-26: qty 5

## 2022-05-26 MED ORDER — ONDANSETRON HCL 4 MG/2ML IJ SOLN
INTRAMUSCULAR | Status: AC
  Filled 2022-05-26: qty 2

## 2022-05-26 MED ORDER — CEFAZOLIN SODIUM-DEXTROSE 2-4 GM/100ML-% IV SOLN
INTRAVENOUS | Status: AC
  Filled 2022-05-26: qty 100

## 2022-05-26 MED ORDER — CEFAZOLIN SODIUM-DEXTROSE 2-4 GM/100ML-% IV SOLN
2.0000 g | INTRAVENOUS | Status: AC
  Administered 2022-05-26: 2 g via INTRAVENOUS

## 2022-05-26 MED ORDER — HYDROCODONE-ACETAMINOPHEN 5-325 MG PO TABS: 1.0000 | ORAL_TABLET | ORAL | 0 refills | Status: DC | PRN

## 2022-05-26 MED ORDER — ACETAMINOPHEN 500 MG PO TABS
1000.0000 mg | ORAL_TABLET | Freq: Once | ORAL | Status: AC
  Administered 2022-05-26: 1000 mg via ORAL

## 2022-05-26 MED ORDER — PROPOFOL 500 MG/50ML IV EMUL
INTRAVENOUS | Status: AC
  Filled 2022-05-26: qty 50

## 2022-05-26 MED ORDER — OXYCODONE HCL 5 MG/5ML PO SOLN: 5.0000 mg | Freq: Once | ORAL | Status: AC | PRN

## 2022-05-26 MED ORDER — SODIUM CHLORIDE (PF) 0.9 % IJ SOLN
INTRAMUSCULAR | Status: DC | PRN
  Administered 2022-05-26: 10 mL

## 2022-05-26 MED ORDER — DEXAMETHASONE SODIUM PHOSPHATE 10 MG/ML IJ SOLN
INTRAMUSCULAR | Status: AC
  Filled 2022-05-26: qty 1

## 2022-05-26 MED ORDER — PROPOFOL 10 MG/ML IV BOLUS
INTRAVENOUS | Status: DC | PRN
  Administered 2022-05-26: 20 mg via INTRAVENOUS

## 2022-05-26 MED ORDER — DEXAMETHASONE SODIUM PHOSPHATE 4 MG/ML IJ SOLN
INTRAMUSCULAR | Status: DC | PRN
  Administered 2022-05-26: 5 mg via INTRAVENOUS

## 2022-05-26 MED ORDER — OXYCODONE HCL 5 MG PO TABS
ORAL_TABLET | ORAL | Status: AC
  Filled 2022-05-26: qty 1

## 2022-05-26 MED ORDER — PROPOFOL 500 MG/50ML IV EMUL
INTRAVENOUS | Status: DC | PRN
  Administered 2022-05-26: 50 ug/kg/min via INTRAVENOUS

## 2022-05-26 MED ORDER — LACTATED RINGERS IV SOLN: INTRAVENOUS | Status: DC

## 2022-05-26 MED ORDER — LIDOCAINE HCL (CARDIAC) PF 100 MG/5ML IV SOSY
PREFILLED_SYRINGE | INTRAVENOUS | Status: DC | PRN
  Administered 2022-05-26: 40 mg via INTRAVENOUS

## 2022-05-26 MED ORDER — ONDANSETRON HCL 4 MG/2ML IJ SOLN: 4.0000 mg | Freq: Once | INTRAMUSCULAR | Status: DC | PRN

## 2022-05-26 MED ORDER — ACETAMINOPHEN 500 MG PO TABS
ORAL_TABLET | ORAL | Status: AC
  Filled 2022-05-26: qty 2

## 2022-05-26 MED ORDER — FENTANYL CITRATE (PF) 100 MCG/2ML IJ SOLN
INTRAMUSCULAR | Status: DC | PRN
  Administered 2022-05-26 (×2): 25 ug via INTRAVENOUS
  Administered 2022-05-26 (×2): 12.5 ug via INTRAVENOUS

## 2022-05-26 MED ORDER — OXYCODONE HCL 5 MG PO TABS
5.0000 mg | ORAL_TABLET | Freq: Once | ORAL | Status: AC | PRN
  Administered 2022-05-26: 5 mg via ORAL

## 2022-05-26 MED ORDER — PROPOFOL 10 MG/ML IV BOLUS
INTRAVENOUS | Status: AC
  Filled 2022-05-26: qty 20

## 2022-05-26 SURGICAL SUPPLY — 26 items
BLADE CLIPPER SENSICLIP SURGIC (BLADE) ×2 IMPLANT
CNTNR URN SCR LID CUP LEK RST (MISCELLANEOUS) ×2 IMPLANT
CONT SPEC 4OZ STRL OR WHT (MISCELLANEOUS) ×2
COVER BACK TABLE 60X90IN (DRAPES) ×2 IMPLANT
DRSG TEGADERM 4X4.75 (GAUZE/BANDAGES/DRESSINGS) ×2 IMPLANT
DRSG TEGADERM 8X12 (GAUZE/BANDAGES/DRESSINGS) ×2 IMPLANT
GAUZE SPONGE 4X4 12PLY STRL (GAUZE/BANDAGES/DRESSINGS) ×2 IMPLANT
GLOVE BIO SURGEON STRL SZ7.5 (GLOVE) ×2 IMPLANT
GLOVE ECLIPSE 8.0 STRL XLNG CF (GLOVE) ×2 IMPLANT
GLOVE SURG ORTHO 8.5 STRL (GLOVE) ×2 IMPLANT
IMPL SPACEOAR VUE SYSTEM (Spacer) ×2 IMPLANT
IMPLANT SPACEOAR VUE SYSTEM (Spacer) ×2 IMPLANT
KIT TURNOVER CYSTO (KITS) ×2 IMPLANT
MARKER GOLD PRELOAD 1.2X3 (Urological Implant) ×2 IMPLANT
MARKER SKIN DUAL TIP RULER LAB (MISCELLANEOUS) ×2 IMPLANT
NDL SPNL 22GX3.5 QUINCKE BK (NEEDLE) IMPLANT
NEEDLE SPNL 22GX3.5 QUINCKE BK (NEEDLE) IMPLANT
SEED GOLD PRELOAD 1.2X3 (Urological Implant) ×6 IMPLANT
SHEATH ULTRASOUND LF (SHEATH) IMPLANT
SHEATH ULTRASOUND LTX NONSTRL (SHEATH) IMPLANT
SLEEVE SCD COMPRESS KNEE MED (STOCKING) ×2 IMPLANT
SURGILUBE 2OZ TUBE FLIPTOP (MISCELLANEOUS) ×2 IMPLANT
SYR 10ML LL (SYRINGE) IMPLANT
SYR CONTROL 10ML LL (SYRINGE) ×2 IMPLANT
TOWEL OR 17X24 6PK STRL BLUE (TOWEL DISPOSABLE) ×2 IMPLANT
UNDERPAD 30X36 HEAVY ABSORB (UNDERPADS AND DIAPERS) ×2 IMPLANT

## 2022-05-26 NOTE — Anesthesia Procedure Notes (Signed)
Date/Time: 05/26/2022 8:34 AM  Performed by: Jessica Priest, CRNAPre-anesthesia Checklist: Timeout performed, Patient being monitored, Suction available, Emergency Drugs available and Patient identified Patient Re-evaluated:Patient Re-evaluated prior to induction Oxygen Delivery Method: Simple face mask Preoxygenation: Pre-oxygenation with 100% oxygen Induction Type: IV induction Placement Confirmation: breath sounds checked- equal and bilateral, CO2 detector and positive ETCO2

## 2022-05-26 NOTE — Telephone Encounter (Signed)
Called patient to remind of sim appt. for 05-28-22- arrival time- 2:45 pm @ [facility], informed patient to arrive with a full bladder

## 2022-05-26 NOTE — Transfer of Care (Signed)
Immediate Anesthesia Transfer of Care Note  Patient: Vincent [name]  Procedure(s) Performed: Procedure(s) (LRB): GOLD SEED IMPLANT (N/A) SPACE OAR INSTILLATION (N/A)  Patient Location: PACU  Anesthesia Type: MAC  Level of Consciousness: awake, sedated, patient cooperative and responds to stimulation  Airway & Oxygen Therapy: Patient Spontanous Breathing and Patient connected to [place] oxygen  Post-op Assessment: Report given to PACU RN, Post -op Vital signs reviewed and stable and Patient moving all extremities  Post vital signs: Reviewed and stable  Complications: No apparent anesthesia complications

## 2022-05-26 NOTE — Anesthesia Preprocedure Evaluation (Addendum)
Anesthesia Evaluation  Patient identified by MRN, date of birth, ID band Patient awake    Reviewed: Allergy & Precautions, NPO status , Patient's Chart, lab work & pertinent test results  History of Anesthesia Complications Negative for: history of anesthetic complications  Airway Mallampati: III  TM Distance: >3 FB Neck ROM: Full    Dental  (+) Dental Advisory Given, Partial Upper, Partial Lower   Pulmonary former smoker   Pulmonary exam normal        Cardiovascular hypertension, Pt. on medications + DVT  Normal cardiovascular exam     Neuro/Psych  Blindness    negative psych ROS   GI/Hepatic Neg liver ROS,GERD  Controlled,,  Endo/Other  diabetes, Type 2, Oral Hypoglycemic Agents    Renal/GU negative Renal ROS     Musculoskeletal  (+) Arthritis ,    Abdominal   Peds  Hematology negative hematology ROS (+)   Anesthesia Other Findings   Reproductive/Obstetrics                             Anesthesia Physical Anesthesia Plan  ASA: 2  Anesthesia Plan: MAC   Post-op Pain Management: Tylenol PO (pre-op)*   Induction:   PONV Risk Score and Plan: 1 and Propofol infusion and Treatment may vary due to age or medical condition  Airway Management Planned: Natural Airway and Simple Face Mask  Additional Equipment: None  Intra-op Plan:   Post-operative Plan:   Informed Consent: I have reviewed the patients History and Physical, chart, labs and discussed the procedure including the risks, benefits and alternatives for the proposed anesthesia with the patient or authorized representative who has indicated his/her understanding and acceptance.       Plan Discussed with: CRNA and Anesthesiologist  Anesthesia Plan Comments:         Anesthesia Quick Evaluation

## 2022-05-26 NOTE — Anesthesia Postprocedure Evaluation (Signed)
Anesthesia Post Note  Patient: Vincent [name]  Procedure(s) Performed: GOLD SEED IMPLANT (Prostate) SPACE OAR INSTILLATION (Perineum)     Patient location during evaluation: PACU Anesthesia Type: MAC Level of consciousness: awake and alert Pain management: pain level controlled Vital Signs Assessment: post-procedure vital signs reviewed and stable Respiratory status: spontaneous breathing, nonlabored ventilation and respiratory function stable Cardiovascular status: stable and blood pressure returned to baseline Anesthetic complications: no   No notable events documented.  Last Vitals:  Vitals:   05/26/22 0908 05/26/22 0915  BP: 115/77 117/86  Pulse: (!) 106 83  Resp: (!) 25 16  Temp: (!) 36.3 C   SpO2: 94% 95%    Last Pain:  Vitals:   05/26/22 0915  TempSrc:   PainSc: 0-No pain                 Beryle Lathe

## 2022-05-26 NOTE — Op Note (Signed)
Preoperative diagnosis: Clinically localized adenocarcinoma of the prostate   Postoperative diagnosis: Clinically localized adenocarcinoma of the prostate  Procedure: 1) Placement of fiducial markers into prostate                    2) Insertion of SpaceOAR hydrogel   Surgeon: Modena Slater, M.D.  Anesthesia: MAC sedation  EBL: Minimal  Complications: None  Indication: Vincent [name] is a 77 y.o. gentleman with clinically localized prostate cancer. After discussing management options for treatment, he elected to proceed with radiotherapy. He presents today for the above procedures. The potential risks, complications, alternative options, and expected recovery course have been discussed in detail with the patient and he has provided informed consent to proceed.  Description of procedure: The patient was administered preoperative antibiotics, placed in the dorsal lithotomy position, and prepped and draped in the usual sterile fashion. Next, transrectal ultrasonography was utilized to visualize the prostate.  The perineum was anesthetized with quarter percent Marcaine. Three gold fiducial markers were then placed into the prostate via transperineal needles under ultrasound guidance at the right apex, right base, and left mid gland under direct ultrasound guidance. A site in the midline was then selected on the perineum for placement of an 18 g needle with saline. The needle was advanced above the rectum and below Denonvillier's fascia to the mid gland and confirmed to be in the midline on transverse imaging. One cc of saline was injected confirming appropriate expansion of this space. A total of 5 cc of saline was then injected to open the space further bilaterally. The saline syringe was then removed and the SpaceOAR hydrogel was injected with good distribution bilaterally. He tolerated the procedure well and without complications. He was given a voiding trial prior to discharge from the  PACU.

## 2022-05-26 NOTE — Discharge Instructions (Addendum)
Diet Resume your usual diet when you return home. To keep your bowels moving easily and softly, drink prune, apple and cranberry juice at room temperature. You may also take a stool softener, such as Colace, which is available without prescription at local pharmacies. Daily activities No driving or heavy lifting for at least two days after the implant. No bike riding, horseback riding or riding lawn mowers for the first month after the implant. Any strenuous physical activity should be approved by your doctor before you resume it. Sexual relations You may resume sexual relations two weeks after the procedure. Your semen may be dark brown or black; this is normal and is related bleeding that may have occurred during the implant. Postoperative swelling Expect swelling and bruising of the scrotum and perineum (the area between the scrotum and anus). Both the swelling and the bruising should resolve in l or 2 weeks. Ice packs and over- the-counter medications such as Tylenol, Advil or Aleve may lessen your discomfort. Postoperative urination Most men experience burning on urination and/or urinary frequency. If this becomes bothersome, contact your Urologist.  Medication can be prescribed to relieve these problems.  It is normal to have some blood in your urine for a few days after the implant.  Contact your doctor for Temperature greater than 101 F Increasing pain Inability to urinate Follow-up  You should have follow up with your urologist and radiation oncologist about 3 weeks after the procedure.   Post Anesthesia Home Care Instructions  Activity: Get plenty of rest for the remainder of the day. A responsible individual must stay with you for 24 hours following the procedure.  For the next 24 hours, DO NOT: -Drive a car -Advertising copywriter -Drink alcoholic beverages -Take any medication unless instructed by your physician -Make any legal decisions or sign important  papers.  Meals: Start with liquid foods such as gelatin or soup. Progress to regular foods as tolerated. Avoid greasy, spicy, heavy foods. If nausea and/or vomiting occur, drink only clear liquids until the nausea and/or vomiting subsides. Call your physician if vomiting continues.  Special Instructions/Symptoms: Your throat may feel dry or sore from the anesthesia or the breathing tube placed in your throat during surgery. If this causes discomfort, gargle with warm salt water. The discomfort should disappear within 24 hours.  May take Tylenol beginning at 1:00 pm today as needed for soreness/discomfort.

## 2022-05-26 NOTE — H&P (Signed)
H&P  Chief Complaint: Prostate cancer  History of Present Illness: 77 year old male with prostate cancer presents for gold seed marker and SpaceOAR.  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   Chronic LBP 02/08/2011   DDD (degenerative disc disease), cervical 04/11/2011   Diabetes mellitus type 2, diet-controlled    on meds   Diverticulosis    DVT (deep venous thrombosis)    RLE   Elevated PSA 02/08/2011   GERD (gastroesophageal reflux disease) 02/08/2011   with certain foods-on meds   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/2011   pt unaware   History of colon polyps    History of kidney stones    History of prostatitis 04/11/2011   Hyperlipidemia    on meds   Hypertension    on meds   Lumbar disc disease 02/06/2011   Vitamin D deficiency    Wears partial dentures    upper and lower   Past Surgical History:  Procedure Laterality Date   BACK SURGERY     x 3  L4-L5   COLONOSCOPY  2007   Dr Laural Benes   COLONOSCOPY  2017   SA-MAC-suprep(good)-SSP x 1   CYST EXCISION     POLYPECTOMY     SPP x 1   PROSTATE BIOPSY     THULIUM LASER TURP (TRANSURETHRAL RESECTION OF PROSTATE) N/A 10/27/2018   Procedure: THULIUM LASER TURP (TRANSURETHRAL RESECTION OF PROSTATE);  Surgeon: Jerilee Field, MD;  Location: [facility];  Service: Urology;  Laterality: N/A;   WISDOM TOOTH EXTRACTION      Home Medications:  Medications Prior to Admission  Medication Sig Dispense Refill Last Dose   amLODipine (NORVASC) 5 MG tablet TAKE 1 TABLET EVERY DAY 90 tablet 3 05/25/2022   aspirin 81 MG EC tablet Take 1 tablet (81 mg total) by mouth daily. Swallow whole. 30 tablet 12 05/21/2022   Blood Glucose Monitoring Suppl (PRODIGY VOICE BLOOD  GLUCOSE) w/Device KIT 1 each by Does not apply route 2 (two) times daily. Use as directed twice daily E11.9 1 kit 0 Past Week   finasteride (PROSCAR) 5 MG tablet TK 1 T PO QHS 90 tablet 0 05/25/2022   glucose blood test strip Use as instructed twice daily E11.9 300 each 3 Past Week   metFORMIN (GLUCOPHAGE-XR) 500 MG 24 hr tablet TAKE 2 TABLETS(1000 MG) BY MOUTH DAILY WITH BREAKFAST 180 tablet 1 05/25/2022   Prodigy Twist Top Lancets 28G MISC USE AS DIRECTED TO TEST TWO TIMES DAILY 200 each 3 Past Week   rosuvastatin (CRESTOR) 20 MG tablet TAKE 1 TABLET EVERY DAY (NEED MD APPOINTMENT) 90 tablet 3 05/25/2022   tamsulosin (FLOMAX) 0.4 MG CAPS capsule Take 0.4 mg by mouth every evening.   8 05/25/2022   VITAMIN D PO Take 1,000 Units by mouth daily.   05/25/2022   COVID-19 mRNA Virus Vaccine (SPIKEVAX IM) SMARTSIG:IM      dapagliflozin propanediol (FARXIGA) 5 MG TABS tablet Take 1 tablet (5 mg total) by mouth daily before breakfast. (Patient not taking: Reported on 04/09/2022) 90 tablet 3    FLUAD QUADRIVALENT 0.5 ML injection       Allergies: No Known Allergies  Family History  Problem Relation  Age of Onset   Diabetes Mother    Kidney failure Mother    Kidney failure Brother    Diabetes Brother    Blindness Other    Heart disease Other    Hypertension Other    Diabetes Other    Mental illness Other    Sudden death Other    Colon cancer Neg Hx    Esophageal cancer Neg Hx    Stomach cancer Neg Hx    Rectal cancer Neg Hx    Colon polyps Neg Hx    Social History:  reports that he quit smoking about 59 years ago. His smoking use included cigarettes. He has never used smokeless tobacco. He reports that he does not drink alcohol and does not use drugs.  ROS: A complete review of systems was performed.  All systems are negative except for pertinent findings as noted. ROS   Physical Exam:  Vital signs in last 24 hours: Temp:  [97.5 F (36.4 C)] 97.5 F (36.4 C) (04/24 0653) Pulse Rate:  [89]  89 (04/24 0653) Resp:  [16] 16 (04/24 0653) BP: (128)/(93) 128/93 (04/24 0653) SpO2:  [100 %] 100 % (04/24 0653) Weight:  [81.8 kg] 81.8 kg (04/24 0653) General:  Alert and oriented, No acute distress HEENT: Normocephalic, atraumatic Neck: No JVD or lymphadenopathy Cardiovascular: Regular rate and rhythm Lungs: Regular rate and effort Abdomen: Soft, nontender, nondistended, no abdominal masses Back: No CVA tenderness Extremities: No edema Neurologic: Grossly intact  Laboratory Data:  No results found for this or any previous visit (from the past 24 hour(s)). No results found for this or any previous visit (from the past 240 hour(s)). Creatinine: No results for input(s): "CREATININE" in the last 168 hours.  Impression/Assessment:  Prostate cancer  Plan:  Proceed with gold seed marker and SpaceOAR.  Risk and benefits discussed  Ray Church, III 05/26/2022, 6:54 AM

## 2022-05-27 ENCOUNTER — Encounter (HOSPITAL_BASED_OUTPATIENT_CLINIC_OR_DEPARTMENT_OTHER): Payer: Self-pay | Admitting: Urology

## 2022-05-28 ENCOUNTER — Ambulatory Visit
Admission: RE | Admit: 2022-05-28 | Discharge: 2022-05-28 | Disposition: A | Discharge status: Home / Self Care | Payer: Medicare HMO | Source: Ambulatory Visit | Attending: Radiation Oncology | Admitting: Radiation Oncology

## 2022-05-28 DIAGNOSIS — C61 Malignant neoplasm of prostate: Secondary | ICD-10-CM | POA: Diagnosis not present

## 2022-05-28 DIAGNOSIS — Z191 Hormone sensitive malignancy status: Secondary | ICD-10-CM | POA: Diagnosis not present

## 2022-05-28 NOTE — Progress Notes (Signed)
  Radiation Oncology         [phone]) [phone] ________________________________  Name: Vincent [name] MRN: [id number]  Date: 05/28/2022  DOB: May 26, 1945  SIMULATION AND TREATMENT PLANNING NOTE    ICD-10-CM   1. Malignant neoplasm of prostate (HCC)  C61       DIAGNOSIS:  77 y.o. gentleman with Stage T2a adenocarcinoma of the prostate with Gleason score of 4+3, and PSA of 27.8 (adjusted for finasteride).   NARRATIVE:  The patient was brought to the CT Simulation planning suite.  Identity was confirmed.  All relevant records and images related to the planned course of therapy were reviewed.  The patient freely provided informed written consent to proceed with treatment after reviewing the details related to the planned course of therapy. The consent form was witnessed and verified by the simulation staff.  Then, the patient was set-up in a stable reproducible supine position for radiation therapy.  A vacuum lock pillow device was custom fabricated to position his legs in a reproducible immobilized position.  Then, I performed a urethrogram under sterile conditions to identify the prostatic apex.  CT images were obtained.  Surface markings were placed.  The CT images were loaded into the planning software.  Then the prostate target and avoidance structures including the rectum, bladder, bowel and hips were contoured.  Treatment planning then occurred.  The radiation prescription was entered and confirmed.  A total of one complex treatment devices was fabricated. I have requested : Intensity Modulated Radiotherapy (IMRT) is medically necessary for this case for the following reason:  Rectal sparing.Marland Kitchen  PLAN:  The patient will receive 70 Gy in 28 fractions.  ________________________________  Artist Pais Kathrynn Running, M.D.

## 2022-06-03 DIAGNOSIS — C61 Malignant neoplasm of prostate: Secondary | ICD-10-CM | POA: Diagnosis not present

## 2022-06-03 DIAGNOSIS — Z510 Encounter for antineoplastic radiation therapy: Secondary | ICD-10-CM | POA: Diagnosis not present

## 2022-06-03 DIAGNOSIS — Z191 Hormone sensitive malignancy status: Secondary | ICD-10-CM | POA: Diagnosis not present

## 2022-06-09 ENCOUNTER — Other Ambulatory Visit: Payer: Self-pay | Admitting: Internal Medicine

## 2022-06-11 ENCOUNTER — Other Ambulatory Visit: Payer: Self-pay

## 2022-06-15 ENCOUNTER — Other Ambulatory Visit: Payer: Self-pay

## 2022-06-15 ENCOUNTER — Inpatient Hospital Stay: Payer: Medicare HMO | Attending: Radiation Oncology

## 2022-06-15 ENCOUNTER — Ambulatory Visit
Admission: RE | Admit: 2022-06-15 | Discharge: 2022-06-15 | Disposition: A | Discharge status: Home / Self Care | Payer: Medicare HMO | Source: Ambulatory Visit | Attending: Radiation Oncology | Admitting: Radiation Oncology

## 2022-06-15 DIAGNOSIS — Z191 Hormone sensitive malignancy status: Secondary | ICD-10-CM | POA: Diagnosis not present

## 2022-06-15 DIAGNOSIS — C61 Malignant neoplasm of prostate: Secondary | ICD-10-CM | POA: Diagnosis not present

## 2022-06-15 DIAGNOSIS — Z510 Encounter for antineoplastic radiation therapy: Secondary | ICD-10-CM | POA: Diagnosis not present

## 2022-06-15 LAB — RAD ONC ARIA SESSION SUMMARY
Course Elapsed Days: 0
Plan Fractions Treated to Date: 1
Plan Prescribed Dose Per Fraction: 2.5 Gy
Plan Total Fractions Prescribed: 28
Plan Total Prescribed Dose: 70 Gy
Reference Point Dosage Given to Date: 2.5 Gy
Reference Point Session Dosage Given: 2.5 Gy
Session Number: 1

## 2022-06-16 ENCOUNTER — Inpatient Hospital Stay: Payer: Medicare HMO

## 2022-06-16 ENCOUNTER — Other Ambulatory Visit: Payer: Self-pay

## 2022-06-16 ENCOUNTER — Ambulatory Visit
Admission: RE | Admit: 2022-06-16 | Discharge: 2022-06-16 | Disposition: A | Discharge status: Home / Self Care | Payer: Medicare HMO | Source: Ambulatory Visit | Attending: Radiation Oncology | Admitting: Radiation Oncology

## 2022-06-16 DIAGNOSIS — C61 Malignant neoplasm of prostate: Secondary | ICD-10-CM | POA: Diagnosis not present

## 2022-06-16 DIAGNOSIS — Z510 Encounter for antineoplastic radiation therapy: Secondary | ICD-10-CM | POA: Diagnosis not present

## 2022-06-16 DIAGNOSIS — Z191 Hormone sensitive malignancy status: Secondary | ICD-10-CM | POA: Diagnosis not present

## 2022-06-16 LAB — RAD ONC ARIA SESSION SUMMARY
Course Elapsed Days: 1
Plan Fractions Treated to Date: 2
Plan Prescribed Dose Per Fraction: 2.5 Gy
Plan Total Fractions Prescribed: 28
Plan Total Prescribed Dose: 70 Gy
Reference Point Dosage Given to Date: 5 Gy
Reference Point Session Dosage Given: 2.5 Gy
Session Number: 2

## 2022-06-17 ENCOUNTER — Ambulatory Visit
Admission: RE | Admit: 2022-06-17 | Discharge: 2022-06-17 | Disposition: A | Discharge status: Home / Self Care | Payer: Medicare HMO | Source: Ambulatory Visit | Attending: Radiation Oncology | Admitting: Radiation Oncology

## 2022-06-17 ENCOUNTER — Other Ambulatory Visit: Payer: Self-pay

## 2022-06-17 ENCOUNTER — Inpatient Hospital Stay: Payer: Medicare HMO

## 2022-06-17 DIAGNOSIS — Z191 Hormone sensitive malignancy status: Secondary | ICD-10-CM | POA: Diagnosis not present

## 2022-06-17 DIAGNOSIS — C61 Malignant neoplasm of prostate: Secondary | ICD-10-CM | POA: Diagnosis not present

## 2022-06-17 DIAGNOSIS — Z510 Encounter for antineoplastic radiation therapy: Secondary | ICD-10-CM | POA: Diagnosis not present

## 2022-06-17 LAB — RAD ONC ARIA SESSION SUMMARY
Course Elapsed Days: 2
Plan Fractions Treated to Date: 3
Plan Prescribed Dose Per Fraction: 2.5 Gy
Plan Total Fractions Prescribed: 28
Plan Total Prescribed Dose: 70 Gy
Reference Point Dosage Given to Date: 7.5 Gy
Reference Point Session Dosage Given: 2.5 Gy
Session Number: 3

## 2022-06-18 ENCOUNTER — Ambulatory Visit
Admission: RE | Admit: 2022-06-18 | Discharge: 2022-06-18 | Disposition: A | Discharge status: Home / Self Care | Payer: Medicare HMO | Source: Ambulatory Visit | Attending: Radiation Oncology | Admitting: Radiation Oncology

## 2022-06-18 ENCOUNTER — Inpatient Hospital Stay: Payer: Medicare HMO

## 2022-06-18 ENCOUNTER — Other Ambulatory Visit: Payer: Self-pay

## 2022-06-18 DIAGNOSIS — C61 Malignant neoplasm of prostate: Secondary | ICD-10-CM | POA: Diagnosis not present

## 2022-06-18 DIAGNOSIS — Z191 Hormone sensitive malignancy status: Secondary | ICD-10-CM | POA: Diagnosis not present

## 2022-06-18 DIAGNOSIS — Z510 Encounter for antineoplastic radiation therapy: Secondary | ICD-10-CM | POA: Diagnosis not present

## 2022-06-18 LAB — RAD ONC ARIA SESSION SUMMARY
Course Elapsed Days: 3
Plan Fractions Treated to Date: 4
Plan Prescribed Dose Per Fraction: 2.5 Gy
Plan Total Fractions Prescribed: 28
Plan Total Prescribed Dose: 70 Gy
Reference Point Dosage Given to Date: 10 Gy
Reference Point Session Dosage Given: 2.5 Gy
Session Number: 4

## 2022-06-21 ENCOUNTER — Other Ambulatory Visit: Payer: Self-pay

## 2022-06-21 ENCOUNTER — Inpatient Hospital Stay: Payer: Medicare HMO

## 2022-06-21 ENCOUNTER — Ambulatory Visit
Admission: RE | Admit: 2022-06-21 | Discharge: 2022-06-21 | Disposition: A | Discharge status: Home / Self Care | Payer: Medicare HMO | Source: Ambulatory Visit | Attending: Radiation Oncology | Admitting: Radiation Oncology

## 2022-06-21 DIAGNOSIS — Z191 Hormone sensitive malignancy status: Secondary | ICD-10-CM | POA: Diagnosis not present

## 2022-06-21 DIAGNOSIS — C61 Malignant neoplasm of prostate: Secondary | ICD-10-CM | POA: Diagnosis not present

## 2022-06-21 DIAGNOSIS — Z510 Encounter for antineoplastic radiation therapy: Secondary | ICD-10-CM | POA: Diagnosis not present

## 2022-06-21 LAB — RAD ONC ARIA SESSION SUMMARY
Course Elapsed Days: 6
Plan Fractions Treated to Date: 5
Plan Prescribed Dose Per Fraction: 2.5 Gy
Plan Total Fractions Prescribed: 28
Plan Total Prescribed Dose: 70 Gy
Reference Point Dosage Given to Date: 12.5 Gy
Reference Point Session Dosage Given: 2.5 Gy
Session Number: 5

## 2022-06-22 ENCOUNTER — Inpatient Hospital Stay: Payer: Medicare HMO

## 2022-06-22 ENCOUNTER — Other Ambulatory Visit: Payer: Self-pay

## 2022-06-22 ENCOUNTER — Ambulatory Visit
Admission: RE | Admit: 2022-06-22 | Discharge: 2022-06-22 | Disposition: A | Discharge status: Home / Self Care | Payer: Medicare HMO | Source: Ambulatory Visit | Attending: Radiation Oncology | Admitting: Radiation Oncology

## 2022-06-22 DIAGNOSIS — Z191 Hormone sensitive malignancy status: Secondary | ICD-10-CM | POA: Diagnosis not present

## 2022-06-22 DIAGNOSIS — Z510 Encounter for antineoplastic radiation therapy: Secondary | ICD-10-CM | POA: Diagnosis not present

## 2022-06-22 DIAGNOSIS — C61 Malignant neoplasm of prostate: Secondary | ICD-10-CM | POA: Diagnosis not present

## 2022-06-22 LAB — RAD ONC ARIA SESSION SUMMARY
Course Elapsed Days: 7
Plan Fractions Treated to Date: 6
Plan Prescribed Dose Per Fraction: 2.5 Gy
Plan Total Fractions Prescribed: 28
Plan Total Prescribed Dose: 70 Gy
Reference Point Dosage Given to Date: 15 Gy
Reference Point Session Dosage Given: 2.5 Gy
Session Number: 6

## 2022-06-23 ENCOUNTER — Inpatient Hospital Stay: Payer: Medicare HMO

## 2022-06-23 ENCOUNTER — Other Ambulatory Visit: Payer: Self-pay

## 2022-06-23 ENCOUNTER — Ambulatory Visit
Admission: RE | Admit: 2022-06-23 | Discharge: 2022-06-23 | Disposition: A | Discharge status: Home / Self Care | Payer: Medicare HMO | Source: Ambulatory Visit | Attending: Radiation Oncology | Admitting: Radiation Oncology

## 2022-06-23 DIAGNOSIS — Z510 Encounter for antineoplastic radiation therapy: Secondary | ICD-10-CM | POA: Diagnosis not present

## 2022-06-23 DIAGNOSIS — Z191 Hormone sensitive malignancy status: Secondary | ICD-10-CM | POA: Diagnosis not present

## 2022-06-23 DIAGNOSIS — C61 Malignant neoplasm of prostate: Secondary | ICD-10-CM | POA: Diagnosis not present

## 2022-06-23 LAB — RAD ONC ARIA SESSION SUMMARY
Course Elapsed Days: 8
Plan Fractions Treated to Date: 7
Plan Prescribed Dose Per Fraction: 2.5 Gy
Plan Total Fractions Prescribed: 28
Plan Total Prescribed Dose: 70 Gy
Reference Point Dosage Given to Date: 17.5 Gy
Reference Point Session Dosage Given: 2.5 Gy
Session Number: 7

## 2022-06-24 ENCOUNTER — Ambulatory Visit
Admission: RE | Admit: 2022-06-24 | Discharge: 2022-06-24 | Disposition: A | Discharge status: Home / Self Care | Payer: Medicare HMO | Source: Ambulatory Visit | Attending: Radiation Oncology | Admitting: Radiation Oncology

## 2022-06-24 ENCOUNTER — Other Ambulatory Visit: Payer: Self-pay

## 2022-06-24 ENCOUNTER — Inpatient Hospital Stay: Payer: Medicare HMO

## 2022-06-24 DIAGNOSIS — C61 Malignant neoplasm of prostate: Secondary | ICD-10-CM | POA: Diagnosis not present

## 2022-06-24 DIAGNOSIS — Z510 Encounter for antineoplastic radiation therapy: Secondary | ICD-10-CM | POA: Diagnosis not present

## 2022-06-24 DIAGNOSIS — Z191 Hormone sensitive malignancy status: Secondary | ICD-10-CM | POA: Diagnosis not present

## 2022-06-24 LAB — RAD ONC ARIA SESSION SUMMARY
Course Elapsed Days: 9
Plan Fractions Treated to Date: 8
Plan Prescribed Dose Per Fraction: 2.5 Gy
Plan Total Fractions Prescribed: 28
Plan Total Prescribed Dose: 70 Gy
Reference Point Dosage Given to Date: 20 Gy
Reference Point Session Dosage Given: 2.5 Gy
Session Number: 8

## 2022-06-25 ENCOUNTER — Inpatient Hospital Stay: Payer: Medicare HMO

## 2022-06-25 ENCOUNTER — Ambulatory Visit
Admission: RE | Admit: 2022-06-25 | Discharge: 2022-06-25 | Disposition: A | Discharge status: Home / Self Care | Payer: Medicare HMO | Source: Ambulatory Visit | Attending: Radiation Oncology | Admitting: Radiation Oncology

## 2022-06-25 ENCOUNTER — Other Ambulatory Visit: Payer: Self-pay

## 2022-06-25 DIAGNOSIS — Z510 Encounter for antineoplastic radiation therapy: Secondary | ICD-10-CM | POA: Diagnosis not present

## 2022-06-25 DIAGNOSIS — C61 Malignant neoplasm of prostate: Secondary | ICD-10-CM | POA: Diagnosis not present

## 2022-06-25 DIAGNOSIS — Z191 Hormone sensitive malignancy status: Secondary | ICD-10-CM | POA: Diagnosis not present

## 2022-06-25 LAB — RAD ONC ARIA SESSION SUMMARY
Course Elapsed Days: 10
Plan Fractions Treated to Date: 9
Plan Prescribed Dose Per Fraction: 2.5 Gy
Plan Total Fractions Prescribed: 28
Plan Total Prescribed Dose: 70 Gy
Reference Point Dosage Given to Date: 22.5 Gy
Reference Point Session Dosage Given: 2.5 Gy
Session Number: 9

## 2022-06-29 ENCOUNTER — Inpatient Hospital Stay: Payer: Medicare HMO

## 2022-06-29 ENCOUNTER — Other Ambulatory Visit: Payer: Self-pay

## 2022-06-29 ENCOUNTER — Ambulatory Visit
Admission: RE | Admit: 2022-06-29 | Discharge: 2022-06-29 | Disposition: A | Discharge status: Home / Self Care | Payer: Medicare HMO | Source: Ambulatory Visit | Attending: Radiation Oncology | Admitting: Radiation Oncology

## 2022-06-29 DIAGNOSIS — C61 Malignant neoplasm of prostate: Secondary | ICD-10-CM | POA: Diagnosis not present

## 2022-06-29 DIAGNOSIS — Z191 Hormone sensitive malignancy status: Secondary | ICD-10-CM | POA: Diagnosis not present

## 2022-06-29 DIAGNOSIS — Z510 Encounter for antineoplastic radiation therapy: Secondary | ICD-10-CM | POA: Diagnosis not present

## 2022-06-29 LAB — RAD ONC ARIA SESSION SUMMARY
Course Elapsed Days: 14
Plan Fractions Treated to Date: 10
Plan Prescribed Dose Per Fraction: 2.5 Gy
Plan Total Fractions Prescribed: 28
Plan Total Prescribed Dose: 70 Gy
Reference Point Dosage Given to Date: 25 Gy
Reference Point Session Dosage Given: 2.5 Gy
Session Number: 10

## 2022-06-30 ENCOUNTER — Other Ambulatory Visit: Payer: Self-pay

## 2022-06-30 ENCOUNTER — Ambulatory Visit
Admission: RE | Admit: 2022-06-30 | Discharge: 2022-06-30 | Disposition: A | Discharge status: Home / Self Care | Payer: Medicare HMO | Source: Ambulatory Visit | Attending: Radiation Oncology | Admitting: Radiation Oncology

## 2022-06-30 ENCOUNTER — Inpatient Hospital Stay: Payer: Medicare HMO

## 2022-06-30 DIAGNOSIS — C61 Malignant neoplasm of prostate: Secondary | ICD-10-CM | POA: Diagnosis not present

## 2022-06-30 DIAGNOSIS — Z510 Encounter for antineoplastic radiation therapy: Secondary | ICD-10-CM | POA: Diagnosis not present

## 2022-06-30 DIAGNOSIS — Z191 Hormone sensitive malignancy status: Secondary | ICD-10-CM | POA: Diagnosis not present

## 2022-06-30 LAB — RAD ONC ARIA SESSION SUMMARY
Course Elapsed Days: 15
Plan Fractions Treated to Date: 11
Plan Prescribed Dose Per Fraction: 2.5 Gy
Plan Total Fractions Prescribed: 28
Plan Total Prescribed Dose: 70 Gy
Reference Point Dosage Given to Date: 27.5 Gy
Reference Point Session Dosage Given: 2.5 Gy
Session Number: 11

## 2022-07-01 ENCOUNTER — Other Ambulatory Visit: Payer: Self-pay

## 2022-07-01 ENCOUNTER — Ambulatory Visit
Admission: RE | Admit: 2022-07-01 | Discharge: 2022-07-01 | Disposition: A | Discharge status: Home / Self Care | Payer: Medicare HMO | Source: Ambulatory Visit | Attending: Radiation Oncology | Admitting: Radiation Oncology

## 2022-07-01 ENCOUNTER — Inpatient Hospital Stay: Payer: Medicare HMO

## 2022-07-01 DIAGNOSIS — C61 Malignant neoplasm of prostate: Secondary | ICD-10-CM | POA: Diagnosis not present

## 2022-07-01 DIAGNOSIS — Z191 Hormone sensitive malignancy status: Secondary | ICD-10-CM | POA: Diagnosis not present

## 2022-07-01 DIAGNOSIS — Z510 Encounter for antineoplastic radiation therapy: Secondary | ICD-10-CM | POA: Diagnosis not present

## 2022-07-01 LAB — RAD ONC ARIA SESSION SUMMARY
Course Elapsed Days: 16
Plan Fractions Treated to Date: 12
Plan Prescribed Dose Per Fraction: 2.5 Gy
Plan Total Fractions Prescribed: 28
Plan Total Prescribed Dose: 70 Gy
Reference Point Dosage Given to Date: 30 Gy
Reference Point Session Dosage Given: 2.5 Gy
Session Number: 12

## 2022-07-02 ENCOUNTER — Inpatient Hospital Stay: Payer: Medicare HMO

## 2022-07-02 ENCOUNTER — Ambulatory Visit
Admission: RE | Admit: 2022-07-02 | Discharge: 2022-07-02 | Disposition: A | Discharge status: Home / Self Care | Payer: Medicare HMO | Source: Ambulatory Visit | Attending: Radiation Oncology | Admitting: Radiation Oncology

## 2022-07-02 ENCOUNTER — Other Ambulatory Visit: Payer: Self-pay

## 2022-07-02 DIAGNOSIS — C61 Malignant neoplasm of prostate: Secondary | ICD-10-CM | POA: Diagnosis not present

## 2022-07-02 DIAGNOSIS — Z510 Encounter for antineoplastic radiation therapy: Secondary | ICD-10-CM | POA: Diagnosis not present

## 2022-07-02 DIAGNOSIS — Z191 Hormone sensitive malignancy status: Secondary | ICD-10-CM | POA: Diagnosis not present

## 2022-07-02 LAB — RAD ONC ARIA SESSION SUMMARY
Course Elapsed Days: 17
Plan Fractions Treated to Date: 13
Plan Prescribed Dose Per Fraction: 2.5 Gy
Plan Total Fractions Prescribed: 28
Plan Total Prescribed Dose: 70 Gy
Reference Point Dosage Given to Date: 32.5 Gy
Reference Point Session Dosage Given: 2.5 Gy
Session Number: 13

## 2022-07-05 ENCOUNTER — Inpatient Hospital Stay: Payer: Medicare HMO | Attending: Radiation Oncology

## 2022-07-05 ENCOUNTER — Other Ambulatory Visit: Payer: Self-pay

## 2022-07-05 ENCOUNTER — Ambulatory Visit
Admission: RE | Admit: 2022-07-05 | Discharge: 2022-07-05 | Disposition: A | Discharge status: Home / Self Care | Payer: Medicare HMO | Source: Ambulatory Visit | Attending: Radiation Oncology | Admitting: Radiation Oncology

## 2022-07-05 DIAGNOSIS — C61 Malignant neoplasm of prostate: Secondary | ICD-10-CM | POA: Diagnosis not present

## 2022-07-05 DIAGNOSIS — Z191 Hormone sensitive malignancy status: Secondary | ICD-10-CM | POA: Diagnosis not present

## 2022-07-05 DIAGNOSIS — Z510 Encounter for antineoplastic radiation therapy: Secondary | ICD-10-CM | POA: Diagnosis not present

## 2022-07-05 LAB — RAD ONC ARIA SESSION SUMMARY
Course Elapsed Days: 20
Plan Fractions Treated to Date: 14
Plan Prescribed Dose Per Fraction: 2.5 Gy
Plan Total Fractions Prescribed: 28
Plan Total Prescribed Dose: 70 Gy
Reference Point Dosage Given to Date: 35 Gy
Reference Point Session Dosage Given: 2.5 Gy
Session Number: 14

## 2022-07-06 ENCOUNTER — Inpatient Hospital Stay: Payer: Medicare HMO

## 2022-07-06 ENCOUNTER — Other Ambulatory Visit: Payer: Self-pay

## 2022-07-06 ENCOUNTER — Ambulatory Visit
Admission: RE | Admit: 2022-07-06 | Discharge: 2022-07-06 | Disposition: A | Discharge status: Home / Self Care | Payer: Medicare HMO | Source: Ambulatory Visit | Attending: Radiation Oncology | Admitting: Radiation Oncology

## 2022-07-06 DIAGNOSIS — C61 Malignant neoplasm of prostate: Secondary | ICD-10-CM | POA: Diagnosis not present

## 2022-07-06 DIAGNOSIS — Z191 Hormone sensitive malignancy status: Secondary | ICD-10-CM | POA: Diagnosis not present

## 2022-07-06 DIAGNOSIS — Z510 Encounter for antineoplastic radiation therapy: Secondary | ICD-10-CM | POA: Diagnosis not present

## 2022-07-06 LAB — RAD ONC ARIA SESSION SUMMARY
Course Elapsed Days: 21
Plan Fractions Treated to Date: 15
Plan Prescribed Dose Per Fraction: 2.5 Gy
Plan Total Fractions Prescribed: 28
Plan Total Prescribed Dose: 70 Gy
Reference Point Dosage Given to Date: 37.5 Gy
Reference Point Session Dosage Given: 2.5 Gy
Session Number: 15

## 2022-07-07 ENCOUNTER — Ambulatory Visit
Admission: RE | Admit: 2022-07-07 | Discharge: 2022-07-07 | Disposition: A | Discharge status: Home / Self Care | Payer: Medicare HMO | Source: Ambulatory Visit | Attending: Radiation Oncology | Admitting: Radiation Oncology

## 2022-07-07 ENCOUNTER — Other Ambulatory Visit: Payer: Self-pay

## 2022-07-07 ENCOUNTER — Inpatient Hospital Stay: Payer: Medicare HMO

## 2022-07-07 DIAGNOSIS — Z510 Encounter for antineoplastic radiation therapy: Secondary | ICD-10-CM | POA: Diagnosis not present

## 2022-07-07 DIAGNOSIS — Z191 Hormone sensitive malignancy status: Secondary | ICD-10-CM | POA: Diagnosis not present

## 2022-07-07 DIAGNOSIS — C61 Malignant neoplasm of prostate: Secondary | ICD-10-CM | POA: Diagnosis not present

## 2022-07-07 LAB — RAD ONC ARIA SESSION SUMMARY
Course Elapsed Days: 22
Plan Fractions Treated to Date: 16
Plan Prescribed Dose Per Fraction: 2.5 Gy
Plan Total Fractions Prescribed: 28
Plan Total Prescribed Dose: 70 Gy
Reference Point Dosage Given to Date: 40 Gy
Reference Point Session Dosage Given: 2.5 Gy
Session Number: 16

## 2022-07-08 ENCOUNTER — Ambulatory Visit
Admission: RE | Admit: 2022-07-08 | Discharge: 2022-07-08 | Disposition: A | Discharge status: Home / Self Care | Payer: Medicare HMO | Source: Ambulatory Visit | Attending: Radiation Oncology | Admitting: Radiation Oncology

## 2022-07-08 ENCOUNTER — Other Ambulatory Visit: Payer: Self-pay

## 2022-07-08 ENCOUNTER — Inpatient Hospital Stay: Payer: Medicare HMO

## 2022-07-08 DIAGNOSIS — Z510 Encounter for antineoplastic radiation therapy: Secondary | ICD-10-CM | POA: Diagnosis not present

## 2022-07-08 DIAGNOSIS — C61 Malignant neoplasm of prostate: Secondary | ICD-10-CM | POA: Diagnosis not present

## 2022-07-08 DIAGNOSIS — Z191 Hormone sensitive malignancy status: Secondary | ICD-10-CM | POA: Diagnosis not present

## 2022-07-08 LAB — RAD ONC ARIA SESSION SUMMARY
Course Elapsed Days: 23
Plan Fractions Treated to Date: 17
Plan Prescribed Dose Per Fraction: 2.5 Gy
Plan Total Fractions Prescribed: 28
Plan Total Prescribed Dose: 70 Gy
Reference Point Dosage Given to Date: 42.5 Gy
Reference Point Session Dosage Given: 2.5 Gy
Session Number: 17

## 2022-07-09 ENCOUNTER — Other Ambulatory Visit: Payer: Self-pay

## 2022-07-09 ENCOUNTER — Inpatient Hospital Stay: Payer: Medicare HMO

## 2022-07-09 ENCOUNTER — Ambulatory Visit
Admission: RE | Admit: 2022-07-09 | Discharge: 2022-07-09 | Disposition: A | Discharge status: Home / Self Care | Payer: Medicare HMO | Source: Ambulatory Visit | Attending: Radiation Oncology | Admitting: Radiation Oncology

## 2022-07-09 DIAGNOSIS — C61 Malignant neoplasm of prostate: Secondary | ICD-10-CM | POA: Diagnosis not present

## 2022-07-09 DIAGNOSIS — Z191 Hormone sensitive malignancy status: Secondary | ICD-10-CM | POA: Diagnosis not present

## 2022-07-09 DIAGNOSIS — Z510 Encounter for antineoplastic radiation therapy: Secondary | ICD-10-CM | POA: Diagnosis not present

## 2022-07-09 LAB — RAD ONC ARIA SESSION SUMMARY
Course Elapsed Days: 24
Plan Fractions Treated to Date: 18
Plan Prescribed Dose Per Fraction: 2.5 Gy
Plan Total Fractions Prescribed: 28
Plan Total Prescribed Dose: 70 Gy
Reference Point Dosage Given to Date: 45 Gy
Reference Point Session Dosage Given: 2.5 Gy
Session Number: 18

## 2022-07-12 ENCOUNTER — Other Ambulatory Visit: Payer: Self-pay

## 2022-07-12 ENCOUNTER — Inpatient Hospital Stay: Payer: Medicare HMO

## 2022-07-12 ENCOUNTER — Ambulatory Visit
Admission: RE | Admit: 2022-07-12 | Discharge: 2022-07-12 | Disposition: A | Discharge status: Home / Self Care | Payer: Medicare HMO | Source: Ambulatory Visit | Attending: Radiation Oncology | Admitting: Radiation Oncology

## 2022-07-12 DIAGNOSIS — C61 Malignant neoplasm of prostate: Secondary | ICD-10-CM | POA: Diagnosis not present

## 2022-07-12 DIAGNOSIS — Z510 Encounter for antineoplastic radiation therapy: Secondary | ICD-10-CM | POA: Diagnosis not present

## 2022-07-12 DIAGNOSIS — Z191 Hormone sensitive malignancy status: Secondary | ICD-10-CM | POA: Diagnosis not present

## 2022-07-12 LAB — RAD ONC ARIA SESSION SUMMARY
Course Elapsed Days: 27
Plan Fractions Treated to Date: 19
Plan Prescribed Dose Per Fraction: 2.5 Gy
Plan Total Fractions Prescribed: 28
Plan Total Prescribed Dose: 70 Gy
Reference Point Dosage Given to Date: 47.5 Gy
Reference Point Session Dosage Given: 2.5 Gy
Session Number: 19

## 2022-07-13 ENCOUNTER — Other Ambulatory Visit: Payer: Self-pay

## 2022-07-13 ENCOUNTER — Ambulatory Visit
Admission: RE | Admit: 2022-07-13 | Discharge: 2022-07-13 | Disposition: A | Discharge status: Home / Self Care | Payer: Medicare HMO | Source: Ambulatory Visit | Attending: Radiation Oncology | Admitting: Radiation Oncology

## 2022-07-13 ENCOUNTER — Inpatient Hospital Stay: Payer: Medicare HMO

## 2022-07-13 ENCOUNTER — Ambulatory Visit: Payer: Medicare HMO | Admitting: Internal Medicine

## 2022-07-13 DIAGNOSIS — C61 Malignant neoplasm of prostate: Secondary | ICD-10-CM | POA: Diagnosis not present

## 2022-07-13 DIAGNOSIS — Z510 Encounter for antineoplastic radiation therapy: Secondary | ICD-10-CM | POA: Diagnosis not present

## 2022-07-13 DIAGNOSIS — Z191 Hormone sensitive malignancy status: Secondary | ICD-10-CM | POA: Diagnosis not present

## 2022-07-13 LAB — RAD ONC ARIA SESSION SUMMARY
Course Elapsed Days: 28
Plan Fractions Treated to Date: 20
Plan Prescribed Dose Per Fraction: 2.5 Gy
Plan Total Fractions Prescribed: 28
Plan Total Prescribed Dose: 70 Gy
Reference Point Dosage Given to Date: 50 Gy
Reference Point Session Dosage Given: 2.5 Gy
Session Number: 20

## 2022-07-14 ENCOUNTER — Other Ambulatory Visit: Payer: Self-pay

## 2022-07-14 ENCOUNTER — Ambulatory Visit
Admission: RE | Admit: 2022-07-14 | Discharge: 2022-07-14 | Disposition: A | Discharge status: Home / Self Care | Payer: Medicare HMO | Source: Ambulatory Visit | Attending: Radiation Oncology | Admitting: Radiation Oncology

## 2022-07-14 DIAGNOSIS — C61 Malignant neoplasm of prostate: Secondary | ICD-10-CM | POA: Diagnosis not present

## 2022-07-14 DIAGNOSIS — Z510 Encounter for antineoplastic radiation therapy: Secondary | ICD-10-CM | POA: Diagnosis not present

## 2022-07-14 DIAGNOSIS — Z191 Hormone sensitive malignancy status: Secondary | ICD-10-CM | POA: Diagnosis not present

## 2022-07-14 LAB — RAD ONC ARIA SESSION SUMMARY
Course Elapsed Days: 29
Plan Fractions Treated to Date: 21
Plan Prescribed Dose Per Fraction: 2.5 Gy
Plan Total Fractions Prescribed: 28
Plan Total Prescribed Dose: 70 Gy
Reference Point Dosage Given to Date: 52.5 Gy
Reference Point Session Dosage Given: 2.5 Gy
Session Number: 21

## 2022-07-15 ENCOUNTER — Ambulatory Visit
Admission: RE | Admit: 2022-07-15 | Discharge: 2022-07-15 | Disposition: A | Discharge status: Home / Self Care | Payer: Medicare HMO | Source: Ambulatory Visit | Attending: Radiation Oncology | Admitting: Radiation Oncology

## 2022-07-15 ENCOUNTER — Other Ambulatory Visit: Payer: Self-pay

## 2022-07-15 DIAGNOSIS — Z510 Encounter for antineoplastic radiation therapy: Secondary | ICD-10-CM | POA: Diagnosis not present

## 2022-07-15 DIAGNOSIS — C61 Malignant neoplasm of prostate: Secondary | ICD-10-CM | POA: Diagnosis not present

## 2022-07-15 DIAGNOSIS — Z191 Hormone sensitive malignancy status: Secondary | ICD-10-CM | POA: Diagnosis not present

## 2022-07-15 LAB — RAD ONC ARIA SESSION SUMMARY
Course Elapsed Days: 30
Plan Fractions Treated to Date: 22
Plan Prescribed Dose Per Fraction: 2.5 Gy
Plan Total Fractions Prescribed: 28
Plan Total Prescribed Dose: 70 Gy
Reference Point Dosage Given to Date: 55 Gy
Reference Point Session Dosage Given: 2.5 Gy
Session Number: 22

## 2022-07-16 ENCOUNTER — Other Ambulatory Visit: Payer: Self-pay

## 2022-07-16 ENCOUNTER — Ambulatory Visit
Admission: RE | Admit: 2022-07-16 | Discharge: 2022-07-16 | Disposition: A | Discharge status: Home / Self Care | Payer: Medicare HMO | Source: Ambulatory Visit | Attending: Radiation Oncology | Admitting: Radiation Oncology

## 2022-07-16 DIAGNOSIS — Z191 Hormone sensitive malignancy status: Secondary | ICD-10-CM | POA: Diagnosis not present

## 2022-07-16 DIAGNOSIS — C61 Malignant neoplasm of prostate: Secondary | ICD-10-CM | POA: Diagnosis not present

## 2022-07-16 DIAGNOSIS — Z510 Encounter for antineoplastic radiation therapy: Secondary | ICD-10-CM | POA: Diagnosis not present

## 2022-07-16 LAB — RAD ONC ARIA SESSION SUMMARY
Course Elapsed Days: 31
Plan Fractions Treated to Date: 23
Plan Prescribed Dose Per Fraction: 2.5 Gy
Plan Total Fractions Prescribed: 28
Plan Total Prescribed Dose: 70 Gy
Reference Point Dosage Given to Date: 57.5 Gy
Reference Point Session Dosage Given: 2.5 Gy
Session Number: 23

## 2022-07-19 ENCOUNTER — Ambulatory Visit
Admission: RE | Admit: 2022-07-19 | Discharge: 2022-07-19 | Disposition: A | Discharge status: Home / Self Care | Payer: Medicare HMO | Source: Ambulatory Visit | Attending: Radiation Oncology | Admitting: Radiation Oncology

## 2022-07-19 ENCOUNTER — Inpatient Hospital Stay: Payer: Medicare HMO

## 2022-07-19 ENCOUNTER — Other Ambulatory Visit: Payer: Self-pay

## 2022-07-19 DIAGNOSIS — Z510 Encounter for antineoplastic radiation therapy: Secondary | ICD-10-CM | POA: Diagnosis not present

## 2022-07-19 DIAGNOSIS — C61 Malignant neoplasm of prostate: Secondary | ICD-10-CM | POA: Diagnosis not present

## 2022-07-19 DIAGNOSIS — Z191 Hormone sensitive malignancy status: Secondary | ICD-10-CM | POA: Diagnosis not present

## 2022-07-19 LAB — RAD ONC ARIA SESSION SUMMARY
Course Elapsed Days: 34
Plan Fractions Treated to Date: 24
Plan Prescribed Dose Per Fraction: 2.5 Gy
Plan Total Fractions Prescribed: 28
Plan Total Prescribed Dose: 70 Gy
Reference Point Dosage Given to Date: 60 Gy
Reference Point Session Dosage Given: 2.5 Gy
Session Number: 24

## 2022-07-20 ENCOUNTER — Other Ambulatory Visit: Payer: Self-pay

## 2022-07-20 ENCOUNTER — Inpatient Hospital Stay: Payer: Medicare HMO

## 2022-07-20 ENCOUNTER — Ambulatory Visit
Admission: RE | Admit: 2022-07-20 | Discharge: 2022-07-20 | Disposition: A | Discharge status: Home / Self Care | Payer: Medicare HMO | Source: Ambulatory Visit | Attending: Radiation Oncology | Admitting: Radiation Oncology

## 2022-07-20 DIAGNOSIS — Z510 Encounter for antineoplastic radiation therapy: Secondary | ICD-10-CM | POA: Diagnosis not present

## 2022-07-20 DIAGNOSIS — Z191 Hormone sensitive malignancy status: Secondary | ICD-10-CM | POA: Diagnosis not present

## 2022-07-20 DIAGNOSIS — C61 Malignant neoplasm of prostate: Secondary | ICD-10-CM | POA: Diagnosis not present

## 2022-07-20 LAB — RAD ONC ARIA SESSION SUMMARY
Course Elapsed Days: 35
Plan Fractions Treated to Date: 25
Plan Prescribed Dose Per Fraction: 2.5 Gy
Plan Total Fractions Prescribed: 28
Plan Total Prescribed Dose: 70 Gy
Reference Point Dosage Given to Date: 62.5 Gy
Reference Point Session Dosage Given: 2.5 Gy
Session Number: 25

## 2022-07-21 ENCOUNTER — Inpatient Hospital Stay: Payer: Medicare HMO

## 2022-07-21 ENCOUNTER — Other Ambulatory Visit: Payer: Self-pay

## 2022-07-21 ENCOUNTER — Ambulatory Visit
Admission: RE | Admit: 2022-07-21 | Discharge: 2022-07-21 | Disposition: A | Discharge status: Home / Self Care | Payer: Medicare HMO | Source: Ambulatory Visit | Attending: Radiation Oncology | Admitting: Radiation Oncology

## 2022-07-21 DIAGNOSIS — C61 Malignant neoplasm of prostate: Secondary | ICD-10-CM | POA: Diagnosis not present

## 2022-07-21 DIAGNOSIS — Z510 Encounter for antineoplastic radiation therapy: Secondary | ICD-10-CM | POA: Diagnosis not present

## 2022-07-21 DIAGNOSIS — Z191 Hormone sensitive malignancy status: Secondary | ICD-10-CM | POA: Diagnosis not present

## 2022-07-21 LAB — RAD ONC ARIA SESSION SUMMARY
Course Elapsed Days: 36
Plan Fractions Treated to Date: 26
Plan Prescribed Dose Per Fraction: 2.5 Gy
Plan Total Fractions Prescribed: 28
Plan Total Prescribed Dose: 70 Gy
Reference Point Dosage Given to Date: 65 Gy
Reference Point Session Dosage Given: 2.5 Gy
Session Number: 26

## 2022-07-22 ENCOUNTER — Inpatient Hospital Stay: Payer: Medicare HMO

## 2022-07-22 ENCOUNTER — Other Ambulatory Visit: Payer: Self-pay

## 2022-07-22 ENCOUNTER — Ambulatory Visit
Admission: RE | Admit: 2022-07-22 | Discharge: 2022-07-22 | Disposition: A | Discharge status: Home / Self Care | Payer: Medicare HMO | Source: Ambulatory Visit | Attending: Radiation Oncology | Admitting: Radiation Oncology

## 2022-07-22 DIAGNOSIS — Z510 Encounter for antineoplastic radiation therapy: Secondary | ICD-10-CM | POA: Diagnosis not present

## 2022-07-22 DIAGNOSIS — C61 Malignant neoplasm of prostate: Secondary | ICD-10-CM | POA: Diagnosis not present

## 2022-07-22 DIAGNOSIS — Z191 Hormone sensitive malignancy status: Secondary | ICD-10-CM | POA: Diagnosis not present

## 2022-07-22 LAB — RAD ONC ARIA SESSION SUMMARY
Course Elapsed Days: 37
Plan Fractions Treated to Date: 27
Plan Prescribed Dose Per Fraction: 2.5 Gy
Plan Total Fractions Prescribed: 28
Plan Total Prescribed Dose: 70 Gy
Reference Point Dosage Given to Date: 67.5 Gy
Reference Point Session Dosage Given: 2.5 Gy
Session Number: 27

## 2022-07-23 ENCOUNTER — Other Ambulatory Visit: Payer: Self-pay

## 2022-07-23 ENCOUNTER — Inpatient Hospital Stay: Payer: Medicare HMO

## 2022-07-23 ENCOUNTER — Ambulatory Visit
Admission: RE | Admit: 2022-07-23 | Discharge: 2022-07-23 | Disposition: A | Discharge status: Home / Self Care | Payer: Medicare HMO | Source: Ambulatory Visit | Attending: Radiation Oncology | Admitting: Radiation Oncology

## 2022-07-23 DIAGNOSIS — C61 Malignant neoplasm of prostate: Secondary | ICD-10-CM | POA: Diagnosis not present

## 2022-07-23 DIAGNOSIS — Z191 Hormone sensitive malignancy status: Secondary | ICD-10-CM | POA: Diagnosis not present

## 2022-07-23 DIAGNOSIS — Z510 Encounter for antineoplastic radiation therapy: Secondary | ICD-10-CM | POA: Diagnosis not present

## 2022-07-23 LAB — RAD ONC ARIA SESSION SUMMARY
Course Elapsed Days: 38
Plan Fractions Treated to Date: 28
Plan Prescribed Dose Per Fraction: 2.5 Gy
Plan Total Fractions Prescribed: 28
Plan Total Prescribed Dose: 70 Gy
Reference Point Dosage Given to Date: 70 Gy
Reference Point Session Dosage Given: 2.5 Gy
Session Number: 28

## 2022-07-26 ENCOUNTER — Encounter: Payer: Self-pay | Admitting: Internal Medicine

## 2022-07-26 ENCOUNTER — Ambulatory Visit (INDEPENDENT_AMBULATORY_CARE_PROVIDER_SITE_OTHER): Payer: Medicare HMO | Admitting: Internal Medicine

## 2022-07-26 VITALS — BP 124/78 | HR 75 | Temp 98.2°F | Ht 69.0 in | Wt 181.0 lb

## 2022-07-26 DIAGNOSIS — Z0001 Encounter for general adult medical examination with abnormal findings: Secondary | ICD-10-CM | POA: Diagnosis not present

## 2022-07-26 DIAGNOSIS — E7800 Pure hypercholesterolemia, unspecified: Secondary | ICD-10-CM | POA: Diagnosis not present

## 2022-07-26 DIAGNOSIS — E538 Deficiency of other specified B group vitamins: Secondary | ICD-10-CM | POA: Diagnosis not present

## 2022-07-26 DIAGNOSIS — R972 Elevated prostate specific antigen [PSA]: Secondary | ICD-10-CM | POA: Diagnosis not present

## 2022-07-26 DIAGNOSIS — E1165 Type 2 diabetes mellitus with hyperglycemia: Secondary | ICD-10-CM

## 2022-07-26 DIAGNOSIS — E559 Vitamin D deficiency, unspecified: Secondary | ICD-10-CM

## 2022-07-26 DIAGNOSIS — Z7984 Long term (current) use of oral hypoglycemic drugs: Secondary | ICD-10-CM | POA: Diagnosis not present

## 2022-07-26 LAB — LIPID PANEL
Cholesterol: 105 mg/dL (ref 0–200)
HDL: 46.5 mg/dL (ref >39.00)
LDL Cholesterol: 26 mg/dL (ref 0–99)
NonHDL: 58.72
Total CHOL/HDL Ratio: 2
Triglycerides: 166 mg/dL — ABNORMAL HIGH (ref 0.0–149.0)
VLDL: 33.2 mg/dL (ref 0.0–40.0)

## 2022-07-26 LAB — BASIC METABOLIC PANEL
BUN: 19 mg/dL (ref 6–23)
CO2: 26 mEq/L (ref 19–32)
Calcium: 9.9 mg/dL (ref 8.4–10.5)
Chloride: 104 mEq/L (ref 96–112)
Creatinine, Ser: 1.12 mg/dL (ref 0.40–1.50)
GFR: 63.82 mL/min (ref >60.00)
Glucose, Bld: 81 mg/dL (ref 70–99)
Potassium: 4.7 mEq/L (ref 3.5–5.1)
Sodium: 136 mEq/L (ref 135–145)

## 2022-07-26 LAB — CBC WITH DIFFERENTIAL/PLATELET
Basophils Absolute: 0 10*3/uL (ref 0.0–0.1)
Basophils Relative: 0.4 % (ref 0.0–3.0)
Eosinophils Absolute: 0.2 10*3/uL (ref 0.0–0.7)
Eosinophils Relative: 6.1 % — ABNORMAL HIGH (ref 0.0–5.0)
HCT: 38.9 % — ABNORMAL LOW (ref 39.0–52.0)
Hemoglobin: 12.4 g/dL — ABNORMAL LOW (ref 13.0–17.0)
Lymphocytes Relative: 16.7 % (ref 12.0–46.0)
Lymphs Abs: 0.6 10*3/uL — ABNORMAL LOW (ref 0.7–4.0)
MCHC: 32 g/dL (ref 30.0–36.0)
MCV: 87 fl (ref 78.0–100.0)
Monocytes Absolute: 0.6 10*3/uL (ref 0.1–1.0)
Monocytes Relative: 17 % — ABNORMAL HIGH (ref 3.0–12.0)
Neutro Abs: 2.2 10*3/uL (ref 1.4–7.7)
Neutrophils Relative %: 59.8 % (ref 43.0–77.0)
Platelets: 179 10*3/uL (ref 150.0–400.0)
RBC: 4.47 Mil/uL (ref 4.22–5.81)
RDW: 14.5 % (ref 11.5–15.5)
WBC: 3.7 10*3/uL — ABNORMAL LOW (ref 4.0–10.5)

## 2022-07-26 LAB — VITAMIN D 25 HYDROXY (VIT D DEFICIENCY, FRACTURES): VITD: 82.8 ng/mL (ref 30.00–100.00)

## 2022-07-26 LAB — HEPATIC FUNCTION PANEL
ALT: 14 U/L (ref 0–53)
AST: 18 U/L (ref 0–37)
Albumin: 4.2 g/dL (ref 3.5–5.2)
Alkaline Phosphatase: 73 U/L (ref 39–117)
Bilirubin, Direct: 0.1 mg/dL (ref 0.0–0.3)
Total Bilirubin: 0.3 mg/dL (ref 0.2–1.2)
Total Protein: 7.3 g/dL (ref 6.0–8.3)

## 2022-07-26 LAB — TSH: TSH: 1.5 u[IU]/mL (ref 0.35–5.50)

## 2022-07-26 LAB — PSA: PSA: 11.13 ng/mL — ABNORMAL HIGH (ref 0.10–4.00)

## 2022-07-26 LAB — MICROALBUMIN / CREATININE URINE RATIO
Creatinine,U: 51.7 mg/dL
Microalb Creat Ratio: 9.6 mg/g (ref 0.0–30.0)
Microalb, Ur: 5 mg/dL — ABNORMAL HIGH (ref 0.0–1.9)

## 2022-07-26 LAB — HEMOGLOBIN A1C: Hgb A1c MFr Bld: 7 % — ABNORMAL HIGH (ref 4.6–6.5)

## 2022-07-26 LAB — VITAMIN B12: Vitamin B-12: 271 pg/mL (ref 211–911)

## 2022-07-26 NOTE — Patient Instructions (Addendum)
Please have your Shingrix (shingles) shots done at your local pharmacy., and Tdap tetanus shot as well  Please continue all other medications as before, and refills have been done if requested.  Please have the pharmacy call with any other refills you may need.  Please continue your efforts at being more active, low cholesterol diet, and weight control.  You are otherwise up to date with prevention measures today.  Please keep your appointments with your specialists as you may have planned  Please go to the LAB at the blood drawing area for the tests to be done  You will be contacted by phone if any changes need to be made immediately.  Otherwise, you will receive a letter about your results with an explanation, but please check with MyChart first.  Please remember to sign up for MyChart if you have not done so, as this will be important to you in the future with finding out test results, communicating by private email, and scheduling acute appointments online when needed.  Please make an Appointment to return in 6 months, or sooner if needed

## 2022-07-26 NOTE — Radiation Completion Notes (Addendum)
  Radiation Oncology         [phone] ________________________________  Name: [name] MRN: [id number]  Date: 07/23/2022  DOB: 05-07-1945  End of Treatment Note  Patient Name: Vincent [name], Vincent [name] MRN: [id number] Date of Birth: 1945/10/24 Referring Physician: Jerilee Field, M.D. Date of Service: 2022-07-26 Radiation Oncologist: Margaretmary Bayley, M.D. [facility]     RADIATION ONCOLOGY END OF TREATMENT NOTE     Diagnosis: 77 y.o. gentleman with Stage T2a adenocarcinoma of the prostate with Gleason score of 4+3, and PSA of 27.8 (adjusted for finasteride).   Intent: Curative     ==========DELIVERED PLANS==========  First Treatment Date: 2022-06-15 - Last Treatment Date: 2022-07-23   Plan Name: Prostate Site: Prostate Technique: IMRT Mode: Photon Dose Per Fraction: 2.5 Gy Prescribed Dose (Delivered / Prescribed): 70 Gy / 70 Gy Prescribed Fxs (Delivered / Prescribed): 28 / 28     ==========ON TREATMENT VISIT DATES========== 2022-06-18, 2022-06-24, 2022-07-02, 2022-07-09, 2022-07-15, 2022-07-23     See weekly On Treatment Notes in Epic for details. The patient tolerated radiation treatment relatively well with only mild urinary symptoms and modest fatigue. He did report some dysuria that was managed with AZO and increased frequency and nocturia that was managed with Flomax daily.  The patient will receive a call in about one month from the radiation oncology department. He will continue follow up with Dr. Mena Goes as well.  ------------------------------------------------   Margaretmary Dys, MD [facility]  Radiation Oncology Direct Dial: [phone]  Fax: [phone] [email or web address]  Skype  LinkedIn

## 2022-07-26 NOTE — Progress Notes (Unsigned)
Patient ID: Vincent [name], male   DOB: February 27, 1945, 77 y.o.   MRN: [id number]         Chief Complaint:: wellness exam and dm, elevated psa, low vit d , hld       HPI:  Vincent [name] is a 77 y.o. male here for wellness exam; for shingrix and tdap at the pharmacy, pt to call for eye exam soon, o/w up to date                        Also due for f/u urology soon, just finished Xrt for prostate ca.   Could not tolerate farxiga due to making him sick.  Pt denies chest pain, increased sob or doe, wheezing, orthopnea, PND, increased LE swelling, palpitations, dizziness or syncope.   Pt denies polydipsia, polyuria, or new focal neuro s/s.    Pt denies fever, wt loss, night sweats, loss of appetite, or other constitutional symptoms  Denies urinary symptoms such as dysuria, frequency, urgency, flank pain, hematuria or n/v, fever, chills.     Wt Readings from Last 3 Encounters:  07/26/22 181 lb (82.1 kg)  05/26/22 180 lb 6.4 oz (81.8 kg)  04/09/22 172 lb (78 kg)   BP Readings from Last 3 Encounters:  07/26/22 124/78  05/26/22 (!) 147/86  11/16/21 120/82   Immunization History  Administered Date(s) Administered   Influenza, High Dose Seasonal PF 10/14/2018   Influenza,inj,Quad PF,6+ Mos 12/18/2019   Influenza-Unspecified 12/12/2009, 10/29/2020   Moderna SARS-COV2 Booster Vaccination 12/07/2019   Moderna Sars-Covid-2 Vaccination 03/20/2019, 04/18/2019   Pneumococcal Conjugate-13 02/13/2004, 08/01/2012   Pneumococcal Polysaccharide-23 09/10/2014   Td 06/15/2010   Zoster, Live 06/11/2009   Health Maintenance Due  Topic Date Due   Zoster Vaccines- Shingrix (1 of 2) Never done   OPHTHALMOLOGY EXAM  10/08/2018   COVID-19 Vaccine (3 - Moderna risk series) 01/04/2020   DTaP/Tdap/Td (2 - Tdap) 06/14/2020      Past Medical History:  Diagnosis Date   Allergic rhinitis, cause unspecified 02/08/2011   Anemia, unspecified 04/11/2011   hx of   Arthritis    knee   Blindness  02/06/2011   total   BPH (benign prostatic hypertrophy) 02/08/2011   Cataract    as a child, blind since 20 or 10 yrs old   Chronic LBP 02/08/2011   DDD (degenerative disc disease), cervical 04/11/2011   Diabetes mellitus type 2, diet-controlled (HCC)    on meds   Diverticulosis    DVT (deep venous thrombosis) (HCC)    RLE   Elevated PSA 02/08/2011   GERD (gastroesophageal reflux disease) 02/08/2011   with certain foods-on meds   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/2011   pt unaware   History of colon polyps    History of kidney stones    History of prostatitis 04/11/2011   Hyperlipidemia    on meds   Hypertension    on meds   Lumbar disc disease 02/06/2011   Vitamin D deficiency    Wears partial dentures    upper and lower   Past Surgical History:  Procedure Laterality Date   BACK SURGERY     x 3  L4-L5   COLONOSCOPY  2007   Dr Laural Benes   COLONOSCOPY  2017   SA-MAC-suprep(good)-SSP x 1   CYST EXCISION     GOLD SEED IMPLANT N/A 05/26/2022   Procedure: GOLD SEED IMPLANT;  Surgeon: Crista Elliot, MD;  Location: [facility];  Service: Urology;  Laterality: N/A;   POLYPECTOMY     SPP x 1   PROSTATE BIOPSY     SPACE OAR INSTILLATION N/A 05/26/2022   Procedure: SPACE OAR INSTILLATION;  Surgeon: Crista Elliot, MD;  Location: [facility];  Service: Urology;  Laterality: N/A;   THULIUM LASER TURP (TRANSURETHRAL RESECTION OF PROSTATE) N/A 10/27/2018   Procedure: THULIUM LASER TURP (TRANSURETHRAL RESECTION OF PROSTATE);  Surgeon: Jerilee Field, MD;  Location: [facility];  Service: Urology;  Laterality: N/A;   WISDOM TOOTH EXTRACTION      reports that he quit smoking about 59 years ago. His smoking use included cigarettes. He has never used smokeless tobacco. He reports that he does not drink alcohol and does not use drugs. family history includes Blindness in an other family member; Diabetes in his brother, mother, and  another family member; Heart disease in an other family member; Hypertension in an other family member; Kidney failure in his brother and mother; Mental illness in an other family member; Sudden death in an other family member. No Active Allergies Current Outpatient Medications on File Prior to Visit  Medication Sig Dispense Refill   amLODipine (NORVASC) 5 MG tablet TAKE 1 TABLET EVERY DAY 90 tablet 3   aspirin 81 MG EC tablet Take 1 tablet (81 mg total) by mouth daily. Swallow whole. 30 tablet 12   Blood Glucose Monitoring Suppl (PRODIGY VOICE BLOOD GLUCOSE) w/Device KIT 1 each by Does not apply route 2 (two) times daily. Use as directed twice daily E11.9 1 kit 0   COVID-19 mRNA Virus Vaccine (SPIKEVAX IM) SMARTSIG:IM     finasteride (PROSCAR) 5 MG tablet TK 1 T PO QHS 90 tablet 0   FLUAD QUADRIVALENT 0.5 ML injection      glucose blood (PRODIGY NO CODING BLOOD GLUC) test strip TEST BLOOD SUGAR TWICE DAILY AS DIRECTED 200 strip 3   HYDROcodone-acetaminophen (NORCO) 5-325 MG tablet Take 1 tablet by mouth every 4 (four) hours as needed for moderate pain. 6 tablet 0   metFORMIN (GLUCOPHAGE-XR) 500 MG 24 hr tablet TAKE 2 TABLETS(1000 MG) BY MOUTH DAILY WITH BREAKFAST 180 tablet 1   Prodigy Twist Top Lancets 28G MISC USE AS DIRECTED TO TEST TWO TIMES DAILY 200 each 3   rosuvastatin (CRESTOR) 20 MG tablet TAKE 1 TABLET EVERY DAY (NEED MD APPOINTMENT) 90 tablet 3   tamsulosin (FLOMAX) 0.4 MG CAPS capsule Take 0.4 mg by mouth every evening.   8   VITAMIN D PO Take 1,000 Units by mouth daily.     No current facility-administered medications on file prior to visit.        ROS:  All others reviewed and negative.  Objective        PE:  BP 124/78 (BP Location: Left Arm, Patient Position: Sitting, Cuff Size: Normal)   Pulse 75   Temp 98.2 F (36.8 C) (Oral)   Ht 5\' 9"  (1.753 m)   Wt 181 lb (82.1 kg)   SpO2 98%   BMI 26.73 kg/m                 Constitutional: Pt appears in NAD                HENT: Head: NCAT.                Right Ear: External ear normal.                 Left  Ear: External ear normal.                Eyes: . Pupils are equal, round, and reactive to light. Conjunctivae and EOM are normal               Nose: without d/c or deformity               Neck: Neck supple. Gross normal ROM               Cardiovascular: Normal rate and regular rhythm.                 Pulmonary/Chest: Effort normal and breath sounds without rales or wheezing.                Abd:  Soft, NT, ND, + BS, no organomegaly               Neurological: Pt is alert. At baseline orientation, motor grossly intact               Skin: Skin is warm. No rashes, no other new lesions, LE edema - none               Psychiatric: Pt behavior is normal without agitation   Micro: none  Cardiac tracings I have personally interpreted today:  none  Pertinent Radiological findings (summarize): none   Lab Results  Component Value Date   WBC 3.7 (L) 07/26/2022   HGB 12.4 (L) 07/26/2022   HCT 38.9 (L) 07/26/2022   PLT 179.0 07/26/2022   GLUCOSE 81 07/26/2022   CHOL 105 07/26/2022   TRIG 166.0 (H) 07/26/2022   HDL 46.50 07/26/2022   LDLCALC 26 07/26/2022   ALT 14 07/26/2022   AST 18 07/26/2022   NA 136 07/26/2022   K 4.7 07/26/2022   CL 104 07/26/2022   CREATININE 1.12 07/26/2022   BUN 19 07/26/2022   CO2 26 07/26/2022   TSH 1.50 07/26/2022   PSA 11.13 (H) 07/26/2022   HGBA1C 7.0 (H) 07/26/2022   MICROALBUR 5.0 (H) 07/26/2022   Assessment/Plan:  Vincent [name] is a 77 y.o. Black or African American [2] male with  has a past medical history of Allergic rhinitis, cause unspecified (02/08/2011), Anemia, unspecified (04/11/2011), Arthritis, Blindness (02/06/2011), BPH (benign prostatic hypertrophy) (02/08/2011), Cataract, Chronic LBP (02/08/2011), DDD (degenerative disc disease), cervical (04/11/2011), Diabetes mellitus type 2, diet-controlled (HCC), Diverticulosis, DVT (deep venous thrombosis) (HCC),  Elevated PSA (02/08/2011), GERD (gastroesophageal reflux disease) (02/08/2011), Hepatitis B antibody positive (04/11/2011), History of colon polyps, History of kidney stones, History of prostatitis (04/11/2011), Hyperlipidemia, Hypertension, Lumbar disc disease (02/06/2011), Vitamin D deficiency, and Wears partial dentures.  Encounter for well adult exam with abnormal findings Age and sex appropriate education and counseling updated with regular exercise and diet Referrals for preventative services - pt to call for eye exam soon Immunizations addressed - for shingrix and tdap at the pharmacy Smoking counseling  - none needed Evidence for depression or other mood disorder - none significant Most recent labs reviewed. I have personally reviewed and have noted: 1) the patient's medical and social history 2) The patient's current medications and supplements 3) The patient's height, weight, and BMI have been recorded in the chart   Elevated PSA Lab Results  Component Value Date   PSA 11.13 (H) 07/26/2022   PSA 13.53 (H) 10/01/2021   PSA 10.51 (H) 02/17/2021   Overall stable, cont surveillance  Diabetes [facility]) Lab Results  Component Value Date  HGBA1C 7.0 (H) 07/26/2022   Uncontrolled, goal A1c < 7, , pt to continue current medical treatment metfomrin ER 500 mg - 2 every day, declines change for now, for improved DM diet   Hyperlipidemia Lab Results  Component Value Date   LDLCALC 26 07/26/2022   Stable, pt to continue current statin crestor 20 qd   Vitamin D deficiency Last vitamin D Lab Results  Component Value Date   VD25OH 82.80 07/26/2022   Stable, cont oral replacement  Followup: Return in about 6 months (around 01/25/2023).  Oliver Barre, MD 07/28/2022 8:22 PM [facility]

## 2022-07-27 LAB — URINALYSIS, ROUTINE W REFLEX MICROSCOPIC
Bilirubin Urine: NEGATIVE
Hgb urine dipstick: NEGATIVE
Ketones, ur: NEGATIVE
Leukocytes,Ua: NEGATIVE
Nitrite: NEGATIVE
RBC / HPF: NONE SEEN (ref 0–?)
Specific Gravity, Urine: <=1.005 — AB (ref 1.000–1.030)
Total Protein, Urine: NEGATIVE
Urine Glucose: NEGATIVE
Urobilinogen, UA: 0.2 (ref 0.0–1.0)
pH: 6 (ref 5.0–8.0)

## 2022-07-27 NOTE — Progress Notes (Signed)
The test results show that your current treatment is OK, as the tests are stable.  Please continue the same plan.  There is no other need for change of treatment or further evaluation based on these results, at this time.  thanks 

## 2022-07-28 ENCOUNTER — Encounter: Payer: Self-pay | Admitting: Internal Medicine

## 2022-07-28 NOTE — Assessment & Plan Note (Signed)
Age and sex appropriate education and counseling updated with regular exercise and diet Referrals for preventative services - pt to call for eye exam soon Immunizations addressed - for shingrix and tdap at the pharmacy Smoking counseling  - none needed Evidence for depression or other mood disorder - none significant Most recent labs reviewed. I have personally reviewed and have noted: 1) the patient's medical and social history 2) The patient's current medications and supplements 3) The patient's height, weight, and BMI have been recorded in the chart

## 2022-07-28 NOTE — Assessment & Plan Note (Signed)
Last vitamin D Lab Results  Component Value Date   VD25OH 82.80 07/26/2022   Stable, cont oral replacement

## 2022-07-28 NOTE — Assessment & Plan Note (Signed)
Lab Results  Component Value Date   PSA 11.13 (H) 07/26/2022   PSA 13.53 (H) 10/01/2021   PSA 10.51 (H) 02/17/2021   Overall stable, cont surveillance

## 2022-07-28 NOTE — Assessment & Plan Note (Signed)
Lab Results  Component Value Date   LDLCALC 26 07/26/2022   Stable, pt to continue current statin crestor 20 qd

## 2022-07-28 NOTE — Assessment & Plan Note (Signed)
Lab Results  Component Value Date   HGBA1C 7.0 (H) 07/26/2022   Uncontrolled, goal A1c < 7, , pt to continue current medical treatment metfomrin ER 500 mg - 2 every day, declines change for now, for improved DM diet

## 2022-08-06 NOTE — Progress Notes (Signed)
Patient was a RadOnc Consult on 04/09/22 for his stage T2a adenocarcinoma of the prostate with Gleason score of 4+3, and PSA of 27.8 (adjusted for finasteride). Patient proceed with treatment recommendations of  5.5 week course of daily external beam radiation and had his final radiation treatment on 07/23/22.   Patient is scheduled for a post treatment nurse call on 09/07/22 and has his first post treatment PSA on 11/24/22 at [facility].    RN spoke with patient and informed him of PSA lab appointment on 10/23 @ 3:30 and MD follow up on 10/30 @ 3:30.  RN provided education on post treatment follow up's.  No additional needs at this time.

## 2022-08-20 ENCOUNTER — Other Ambulatory Visit: Payer: Self-pay | Admitting: Urology

## 2022-08-20 DIAGNOSIS — C61 Malignant neoplasm of prostate: Secondary | ICD-10-CM

## 2022-09-07 ENCOUNTER — Ambulatory Visit
Admission: RE | Admit: 2022-09-07 | Discharge: 2022-09-07 | Disposition: A | Discharge status: Home / Self Care | Payer: Medicare HMO | Source: Ambulatory Visit | Attending: Radiation Oncology | Admitting: Radiation Oncology

## 2022-09-07 NOTE — Progress Notes (Signed)
  Radiation Oncology         [phone] ________________________________  Name: Vincent [name] MRN: [id number]  Date of Service: 09/07/2022  DOB: Mar 07, 1945  Post Treatment Telephone Note  Diagnosis:  77 y.o. gentleman with Stage T2a adenocarcinoma of the prostate with Gleason score of 4+3, and PSA of 27.8 (adjusted for finasteride). (as documented in provider EOT note)   Pre Treatment IPSS Score: 3 (as documented in the provider consult note)   The patient was available for call today.   Symptoms of fatigue have improved since completing therapy.  Symptoms of bladder changes have improved since completing therapy. Current symptoms include none, and medications for bladder symptoms include Tamsulosin.  Symptoms of bowel changes have improved since completing therapy. Current symptoms include none, and medications for bowel symptoms include none.     Post Treatment IPSS Score: IPSS Questionnaire (AUA-7): Over the past month.   1)  How often have you had a sensation of not emptying your bladder completely after you finish urinating?  0 - Not at all  2)  How often have you had to urinate again less than two hours after you finished urinating? 0 - Not at all  3)  How often have you found you stopped and started again several times when you urinated?  0 - Not at all  4) How difficult have you found it to postpone urination?  0 - Not at all  5) How often have you had a weak urinary stream?  1 - Less than 1 time in 5  6) How often have you had to push or strain to begin urination?  0 - Not at all  7) How many times did you most typically get up to urinate from the time you went to bed until the time you got up in the morning?  2 - 2 times  Total score:  3. Which indicates mild symptoms  0-7 mildly symptomatic   8-19 moderately symptomatic   20-35 severely symptomatic    Patient (has a scheduled follow up visit with his urologist, Dr. Mena Goes , on 12/01/2022 for ongoing  surveillance. He was counseled that PSA levels will be drawn in the urology office, and was reassured that additional time is expected to improve bowel and bladder symptoms. He was encouraged to call back with concerns or questions regarding radiation.   This concludes the interaction.  Ruel Favors, LPN

## 2022-09-24 ENCOUNTER — Other Ambulatory Visit: Payer: Self-pay | Admitting: Internal Medicine

## 2022-10-12 ENCOUNTER — Encounter: Payer: Self-pay | Admitting: *Deleted

## 2022-10-12 ENCOUNTER — Inpatient Hospital Stay: Payer: Medicare HMO | Attending: Radiation Oncology | Admitting: *Deleted

## 2022-10-12 DIAGNOSIS — C61 Malignant neoplasm of prostate: Secondary | ICD-10-CM

## 2022-10-12 NOTE — Progress Notes (Signed)
SCP reviewed and completed. Pt will get PSA labs done on Oct.23rd at [facility].

## 2022-10-25 ENCOUNTER — Other Ambulatory Visit: Payer: Self-pay

## 2022-10-25 ENCOUNTER — Telehealth: Payer: Self-pay | Admitting: Internal Medicine

## 2022-10-25 ENCOUNTER — Other Ambulatory Visit: Payer: Self-pay | Admitting: Internal Medicine

## 2022-10-25 NOTE — Telephone Encounter (Signed)
Prescription Request  10/25/2022  LOV: 07/26/2022  What is the name of the medication or equipment? Ibuprofen 800 mg  Have you contacted your pharmacy to request a refill? Yes   Which pharmacy would you like this sent to?  [id number] [place], [place] - [phone] AT [facility] [address] [place] [address] Phone: [phone] Fax: [phone]  thin 2 business days.   Please advise at Mobile [phone] (mobile)

## 2022-10-26 ENCOUNTER — Other Ambulatory Visit: Payer: Self-pay | Admitting: Internal Medicine

## 2022-10-26 MED ORDER — DAPAGLIFLOZIN PROPANEDIOL 5 MG PO TABS: 5.0000 mg | ORAL_TABLET | Freq: Every day | ORAL | 3 refills | Status: AC

## 2022-10-26 MED ORDER — IBUPROFEN 800 MG PO TABS: 800.0000 mg | ORAL_TABLET | Freq: Three times a day (TID) | ORAL | 1 refills | Status: DC | PRN

## 2022-10-26 MED ORDER — DAPAGLIFLOZIN PROPANEDIOL 5 MG PO TABS
5.0000 mg | ORAL_TABLET | Freq: Every day | ORAL | 3 refills | Status: DC
Start: 2022-10-26 — End: 2022-10-26

## 2022-10-26 NOTE — Telephone Encounter (Signed)
Done erx

## 2022-10-26 NOTE — Telephone Encounter (Signed)
Ok , but a limited rx only is done, due to risk of GI side effects  Ok to let pt know, we would not want to take this med on a long term basis  I can change to something like mobic if he needs a similar med for more long term pain control    thanks

## 2022-11-24 DIAGNOSIS — C61 Malignant neoplasm of prostate: Secondary | ICD-10-CM | POA: Diagnosis not present

## 2022-12-01 DIAGNOSIS — C61 Malignant neoplasm of prostate: Secondary | ICD-10-CM | POA: Diagnosis not present

## 2022-12-01 DIAGNOSIS — N3000 Acute cystitis without hematuria: Secondary | ICD-10-CM | POA: Diagnosis not present

## 2022-12-06 ENCOUNTER — Other Ambulatory Visit: Payer: Self-pay | Admitting: Internal Medicine

## 2022-12-06 ENCOUNTER — Other Ambulatory Visit: Payer: Self-pay

## 2022-12-29 DIAGNOSIS — N3000 Acute cystitis without hematuria: Secondary | ICD-10-CM | POA: Diagnosis not present

## 2023-01-17 DIAGNOSIS — N3000 Acute cystitis without hematuria: Secondary | ICD-10-CM | POA: Diagnosis not present

## 2023-02-01 DIAGNOSIS — R972 Elevated prostate specific antigen [PSA]: Secondary | ICD-10-CM | POA: Diagnosis not present

## 2023-02-09 DIAGNOSIS — N401 Enlarged prostate with lower urinary tract symptoms: Secondary | ICD-10-CM | POA: Diagnosis not present

## 2023-02-09 DIAGNOSIS — C61 Malignant neoplasm of prostate: Secondary | ICD-10-CM | POA: Diagnosis not present

## 2023-02-09 DIAGNOSIS — R3915 Urgency of urination: Secondary | ICD-10-CM | POA: Diagnosis not present

## 2023-02-16 ENCOUNTER — Telehealth: Payer: Self-pay | Admitting: Internal Medicine

## 2023-02-16 NOTE — Telephone Encounter (Signed)
 Copied from CRM [id number]. Topic: General - Other >> Feb 16, 2023 11:23 AM Albertha Alosa wrote: Reason for CRM: Patient called in regarding form to be signed for re certification for transportation, Stated his wife will be by this afternoon to drop it off.  ----  Forms have been received and placed in providers box.  Please fax when complete.

## 2023-02-18 NOTE — Telephone Encounter (Signed)
Form placed on provider desk.

## 2023-02-23 NOTE — Telephone Encounter (Signed)
Forms have been faxed 

## 2023-02-25 NOTE — Telephone Encounter (Unsigned)
Copied from CRM [id number]. Topic: General - Other >> Feb 25, 2023  1:59 PM Corin V wrote: Reason for CRM: Patient called regarding the transportation paperwork faxed on 1/22. The company only received 1 page of the fax. Please resend fax

## 2023-02-28 ENCOUNTER — Telehealth: Payer: Self-pay

## 2023-02-28 NOTE — Telephone Encounter (Signed)
Forms have been refaxed

## 2023-02-28 NOTE — Telephone Encounter (Signed)
Patient requesting call back [phone].

## 2023-02-28 NOTE — Telephone Encounter (Unsigned)
Copied from CRM [id number]. Topic: General - Other >> Feb 28, 2023 12:54 PM Fredrich Romans wrote: Reason for CRM: patient called in stating that the transportation forms for certification that were faxed over,all of them did not come over ,she would like to know if they could be refaxed.

## 2023-03-01 NOTE — Telephone Encounter (Signed)
Forms have been faxed again.

## 2023-03-02 ENCOUNTER — Telehealth: Payer: Self-pay | Admitting: Internal Medicine

## 2023-03-02 NOTE — Telephone Encounter (Signed)
Copied from CRM [id number]. Topic: General - Other >> Mar 02, 2023 12:16 PM Taleah C wrote: Reason for CRM: Jennetta from [employer] called and stated that someone from the clinic is trying to fax documents to them for services of transportation for ADA eligibility for the Patient. She explained that there was no cover letter of who is sending it to them and only 1-2 pages is sending and its very distorted. Please callback and advise at [phone] (direct line).

## 2023-03-02 NOTE — Telephone Encounter (Signed)
Copied from CRM [id number]. Topic: General - Other >> Mar 02, 2023 10:12 AM Ernst Spell wrote: Reason for CRM: pt called and stated that his brother Artis Delay, will be coming into the office to pick up the application for transportation re-certification, that he dropped off. Please advise.

## 2023-03-03 NOTE — Telephone Encounter (Signed)
Will place up front for pick up.

## 2023-03-03 NOTE — Telephone Encounter (Signed)
Copied from CRM [id number]. Topic: General - Other >> Mar 03, 2023  1:21 PM Irine Seal wrote: Reason for CRM: pt called in to state that [employer] received the transportation eligibility ADA re certification, but the fax is missing page 2 of part B, patient is requesting that they been sent as soon as possible, per previous note Jennetta at [facility]  direct line is [phone]

## 2023-03-03 NOTE — Telephone Encounter (Signed)
Pt has called and stated he will pick up forms due to them not all going thru after faxing several times.

## 2023-03-08 NOTE — Telephone Encounter (Signed)
Forms faxed again

## 2023-03-17 ENCOUNTER — Other Ambulatory Visit: Payer: Self-pay

## 2023-03-17 ENCOUNTER — Other Ambulatory Visit: Payer: Self-pay | Admitting: Internal Medicine

## 2023-04-25 DIAGNOSIS — R310 Gross hematuria: Secondary | ICD-10-CM | POA: Diagnosis not present

## 2023-04-25 DIAGNOSIS — R8271 Bacteriuria: Secondary | ICD-10-CM | POA: Diagnosis not present

## 2023-04-25 DIAGNOSIS — N3000 Acute cystitis without hematuria: Secondary | ICD-10-CM | POA: Diagnosis not present

## 2023-05-02 ENCOUNTER — Other Ambulatory Visit: Payer: Self-pay

## 2023-05-02 ENCOUNTER — Other Ambulatory Visit: Payer: Self-pay | Admitting: Internal Medicine

## 2023-05-04 DIAGNOSIS — C61 Malignant neoplasm of prostate: Secondary | ICD-10-CM | POA: Diagnosis not present

## 2023-05-11 ENCOUNTER — Other Ambulatory Visit: Payer: Self-pay | Admitting: Internal Medicine

## 2023-05-11 DIAGNOSIS — N400 Enlarged prostate without lower urinary tract symptoms: Secondary | ICD-10-CM | POA: Diagnosis not present

## 2023-05-11 DIAGNOSIS — N3000 Acute cystitis without hematuria: Secondary | ICD-10-CM | POA: Diagnosis not present

## 2023-05-11 DIAGNOSIS — C61 Malignant neoplasm of prostate: Secondary | ICD-10-CM | POA: Diagnosis not present

## 2023-05-12 ENCOUNTER — Ambulatory Visit (INDEPENDENT_AMBULATORY_CARE_PROVIDER_SITE_OTHER): Admitting: Internal Medicine

## 2023-05-12 ENCOUNTER — Encounter: Payer: Self-pay | Admitting: Internal Medicine

## 2023-05-12 VITALS — BP 120/72 | HR 73 | Temp 98.3°F | Resp 18 | Ht 69.0 in | Wt 179.2 lb

## 2023-05-12 DIAGNOSIS — E559 Vitamin D deficiency, unspecified: Secondary | ICD-10-CM | POA: Diagnosis not present

## 2023-05-12 DIAGNOSIS — Z7984 Long term (current) use of oral hypoglycemic drugs: Secondary | ICD-10-CM | POA: Diagnosis not present

## 2023-05-12 DIAGNOSIS — E7800 Pure hypercholesterolemia, unspecified: Secondary | ICD-10-CM | POA: Diagnosis not present

## 2023-05-12 DIAGNOSIS — E1165 Type 2 diabetes mellitus with hyperglycemia: Secondary | ICD-10-CM

## 2023-05-12 MED ORDER — PRODIGY NO CODING BLOOD GLUC VI STRP: ORAL_STRIP | 3 refills | Status: AC

## 2023-05-12 NOTE — Assessment & Plan Note (Signed)
 Lab Results  Component Value Date   LDLCALC 26 07/26/2022   Stable, pt to continue current statin crestor 20 mg qd

## 2023-05-12 NOTE — Assessment & Plan Note (Signed)
 Lab Results  Component Value Date   HGBA1C 7.0 (H) 07/26/2022   Stable, pt to continue current medical treatment farxiga 5 mg every day, metformin ER 500 mg - 2 qd

## 2023-05-12 NOTE — Patient Instructions (Signed)
Your ears were irrigated of wax today  Please continue all other medications as before, and refills have been done if requested.  Please have the pharmacy call with any other refills you may need.  Please continue your efforts at being more active, low cholesterol diet, and weight control.  Please keep your appointments with your specialists as you may have planned   

## 2023-05-12 NOTE — Assessment & Plan Note (Signed)
Last vitamin D Lab Results  Component Value Date   VD25OH 82.80 07/26/2022   Stable, cont oral replacement

## 2023-05-12 NOTE — Progress Notes (Signed)
 Patient ID: Vincent [name], male   DOB: 1946-01-27, 78 y.o.   MRN: [id number]        Chief Complaint: follow up bilateral ear wax impactions, dm, htn, low vit d       HPI:  Vincent [name] is a 78 y.o. male here overall doing ok,  has reduced hearing bilateral with wax impactions.  No pain, or vertigo or tinnitus.  Pt denies chest pain, increased sob or doe, wheezing, orthopnea, PND, increased LE swelling, palpitations, dizziness or syncope.   Pt denies polydipsia, polyuria, or new focal neuro s/s.    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   Chronic LBP 02/08/2011   DDD (degenerative disc disease), cervical 04/11/2011   Diabetes mellitus type 2, diet-controlled (HCC)    on meds   Diverticulosis    DVT (deep venous thrombosis) (HCC)    RLE   Elevated PSA 02/08/2011   GERD (gastroesophageal reflux disease) 02/08/2011   with certain foods-on meds   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/2011   pt unaware   History of colon polyps    History of kidney stones    History of prostatitis 04/11/2011   Hyperlipidemia    on meds   Hypertension    on meds   Lumbar disc disease 02/06/2011   Vitamin D deficiency    Wears partial dentures    upper and lower   Past Surgical History:  Procedure Laterality Date   BACK SURGERY     x 3  L4-L5   COLONOSCOPY  2007   Dr Laural Benes   COLONOSCOPY  2017    SA-MAC-suprep(good)-SSP x 1   CYST EXCISION     GOLD SEED IMPLANT N/A 05/26/2022   Procedure: GOLD SEED IMPLANT;  Surgeon: Crista Elliot, MD;  Location: [facility] [place];  Service: Urology;  Laterality: N/A;   POLYPECTOMY     SPP x 1   PROSTATE BIOPSY     SPACE OAR INSTILLATION N/A 05/26/2022   Procedure: SPACE OAR INSTILLATION;  Surgeon: Crista Elliot, MD;  Location: [facility];  Service: Urology;  Laterality: N/A;   THULIUM LASER TURP (TRANSURETHRAL RESECTION OF PROSTATE) N/A 10/27/2018   Procedure: THULIUM LASER TURP (TRANSURETHRAL RESECTION OF PROSTATE);  Surgeon: Jerilee Field, MD;  Location: [facility];  Service: Urology;  Laterality: N/A;   WISDOM TOOTH EXTRACTION      reports that he quit smoking about 60 years ago. His smoking use included cigarettes. He has never used smokeless tobacco. He reports that he does not drink alcohol and does not use drugs. family history includes Blindness in an other family member; Diabetes in his brother, mother, and another family member; Heart disease in an other family member; Hypertension in an  other family member; Kidney failure in his brother and mother; Mental illness in an other family member; Sudden death in an other family member. No Active Allergies Current Outpatient Medications on File Prior to Visit  Medication Sig Dispense Refill   amLODipine (NORVASC) 5 MG tablet TAKE 1 TABLET EVERY DAY 90 tablet 3   aspirin 81 MG EC tablet Take 1 tablet (81 mg total) by mouth daily. Swallow whole. 30 tablet 12   Blood Glucose Monitoring Suppl (PRODIGY VOICE BLOOD GLUCOSE) w/Device KIT 1 each by Does not apply route 2 (two) times daily. Use as directed twice daily E11.9 1 kit 0   dapagliflozin propanediol (FARXIGA) 5 MG TABS tablet Take 1 tablet (5 mg total) by mouth daily before breakfast. 90 tablet 3   finasteride (PROSCAR) 5 MG tablet TK 1 T PO QHS 90 tablet 0   ibuprofen (ADVIL) 800 MG tablet Take  1 tablet (800 mg total) by mouth every 8 (eight) hours as needed. 30 tablet 1   metFORMIN (GLUCOPHAGE-XR) 500 MG 24 hr tablet TAKE 2 TABLETS EVERY DAY WITH BREAKFAST 180 tablet 3   Prodigy Twist Top Lancets 28G MISC USE AS DIRECTED TO TEST TWO TIMES DAILY 200 each 3   rosuvastatin (CRESTOR) 20 MG tablet TAKE 1 TABLET EVERY DAY (NEED MD APPOINTMENT) 90 tablet 3   tamsulosin (FLOMAX) 0.4 MG CAPS capsule Take 0.4 mg by mouth every evening.   8   VITAMIN D PO Take 1,000 Units by mouth daily.     No current facility-administered medications on file prior to visit.        ROS:  All others reviewed and negative.  Objective        PE:  BP 120/72 (BP Location: Left Arm, Patient Position: Sitting, Cuff Size: Normal)   Pulse 73   Temp 98.3 F (36.8 C) (Temporal)   Resp 18   Ht 5\' 9"  (1.753 m)   Wt 179 lb 3.2 oz (81.3 kg)   SpO2 99%   BMI 26.46 kg/m                 Constitutional: Pt appears in NAD               HENT: Head: NCAT.                Right Ear: External ear normal.                 Left Ear: External ear normal.  Bilateral wax impactions resolved               Eyes: . Pupils are equal, round, and reactive to light. Conjunctivae and EOM are normal               Nose: without d/c or deformity               Neck: Neck supple. Gross normal ROM               Cardiovascular: Normal rate and regular rhythm.                 Pulmonary/Chest: Effort normal and breath sounds without rales or wheezing.                Abd:  Soft, NT, ND, + BS, no organomegaly               Neurological: Pt is alert. At baseline orientation, motor grossly intact  Skin: Skin is warm. No rashes, no other new lesions, LE edema - none               Psychiatric: Pt behavior is normal without agitation   Micro: none  Cardiac tracings I have personally interpreted today:  none  Pertinent Radiological findings (summarize): none   Lab Results  Component Value Date   WBC 3.7 (L) 07/26/2022   HGB  12.4 (L) 07/26/2022   HCT 38.9 (L) 07/26/2022   PLT 179.0 07/26/2022   GLUCOSE 81 07/26/2022   CHOL 105 07/26/2022   TRIG 166.0 (H) 07/26/2022   HDL 46.50 07/26/2022   LDLCALC 26 07/26/2022   ALT 14 07/26/2022   AST 18 07/26/2022   NA 136 07/26/2022   K 4.7 07/26/2022   CL 104 07/26/2022   CREATININE 1.12 07/26/2022   BUN 19 07/26/2022   CO2 26 07/26/2022   TSH 1.50 07/26/2022   PSA 11.13 (H) 07/26/2022   HGBA1C 7.0 (H) 07/26/2022   MICROALBUR 5.0 (H) 07/26/2022   Assessment/Plan:  Anthonio Mizzell is a 78 y.o. Black or African American [2] male with  has a past medical history of Allergic rhinitis, cause unspecified (02/08/2011), Anemia, unspecified (04/11/2011), Arthritis, Blindness (02/06/2011), BPH (benign prostatic hypertrophy) (02/08/2011), Cataract, Chronic LBP (02/08/2011), DDD (degenerative disc disease), cervical (04/11/2011), Diabetes mellitus type 2, diet-controlled (HCC), Diverticulosis, DVT (deep venous thrombosis) (HCC), Elevated PSA (02/08/2011), GERD (gastroesophageal reflux disease) (02/08/2011), Hepatitis B antibody positive (04/11/2011), History of colon polyps, History of kidney stones, History of prostatitis (04/11/2011), Hyperlipidemia, Hypertension, Lumbar disc disease (02/06/2011), Vitamin D deficiency, and Wears partial dentures.  Diabetes (HCC) Lab Results  Component Value Date   HGBA1C 7.0 (H) 07/26/2022   Stable, pt to continue current medical treatment farxiga 5 mg every day, metformin ER 500 mg - 2 qd   Hyperlipidemia Lab Results  Component Value Date   LDLCALC 26 07/26/2022   Stable, pt to continue current statin crestor 20 mg qd   Vitamin D deficiency Last vitamin D Lab Results  Component Value Date   VD25OH 82.80 07/26/2022   Stable, cont oral replacement  Followup: Return in about 11 weeks (around 07/28/2023).  Oliver Barre, MD 05/12/2023 3:19 PM [facility]

## 2023-06-01 ENCOUNTER — Ambulatory Visit

## 2023-06-01 VITALS — Ht 69.0 in | Wt 179.0 lb

## 2023-06-01 DIAGNOSIS — Z0000 Encounter for general adult medical examination without abnormal findings: Secondary | ICD-10-CM | POA: Diagnosis not present

## 2023-06-01 NOTE — Progress Notes (Signed)
 Subjective:   Vincent [name] is a 78 y.o. who presents for a Medicare Wellness preventive visit.  Visit Complete: Virtual I connected with  Vincent [name] on 06/01/23 by a audio enabled telemedicine application and verified that I am speaking with the correct person using two identifiers.  Patient Location: Home  Provider Location: Home Office  I discussed the limitations of evaluation and management by telemedicine. The patient expressed understanding and agreed to proceed.  Vital Signs: Because this visit was a virtual/telehealth visit, some criteria may be missing or patient reported. Any vitals not documented were not able to be obtained and vitals that have been documented are patient reported.  VideoDeclined- This patient declined Librarian, academic. Therefore the visit was completed with audio only.  Persons Participating in Visit: Patient.  AWV Questionnaire: No: Patient Medicare AWV questionnaire was not completed prior to this visit.  Cardiac Risk Factors include: advanced age (>57men, >33 women);male gender;diabetes mellitus     Objective:    Today's Vitals   06/01/23 0928  Weight: 179 lb (81.2 kg)  Height: 5\' 9"  (1.753 m)   Body mass index is 26.43 kg/m.     06/01/2023    9:38 AM 05/26/2022    6:46 AM 04/09/2022    1:27 PM 09/30/2021   10:51 AM 02/01/2021    7:23 PM 01/13/2019    1:57 AM 10/27/2018    3:18 PM  Advanced Directives  Does Patient Have a Medical Advance Directive? Yes Yes Yes Yes No No Yes  Type of Estate agent of [place];Living will Living will Healthcare Power of [place];Living will Healthcare Power of [place];Living will   Living will;Healthcare Power of Attorney  Does patient want to make changes to medical advance directive?       No - Patient declined  Copy of Healthcare Power of Attorney in Chart? No - copy requested   No - copy requested   No - copy requested  Would patient  like information on creating a medical advance directive?     No - Patient declined No - Patient declined     Current Medications (verified) Outpatient Encounter Medications as of 06/01/2023  Medication Sig   amLODipine  (NORVASC ) 5 MG tablet TAKE 1 TABLET EVERY DAY   aspirin  81 MG EC tablet Take 1 tablet (81 mg total) by mouth daily. Swallow whole.   Blood Glucose Monitoring Suppl (PRODIGY VOICE BLOOD GLUCOSE) w/Device KIT 1 each by Does not apply route 2 (two) times daily. Use as directed twice daily E11.9   dapagliflozin  propanediol (FARXIGA ) 5 MG TABS tablet Take 1 tablet (5 mg total) by mouth daily before breakfast.   finasteride  (PROSCAR ) 5 MG tablet TK 1 T PO QHS   glucose blood (PRODIGY NO CODING BLOOD GLUC) test strip TEST BLOOD SUGAR TWO TIMES DAILY AS DIRECTED   ibuprofen  (ADVIL ) 800 MG tablet Take 1 tablet (800 mg total) by mouth every 8 (eight) hours as needed.   metFORMIN  (GLUCOPHAGE -XR) 500 MG 24 hr tablet TAKE 2 TABLETS EVERY DAY WITH BREAKFAST   Prodigy Twist Top Lancets 28G MISC USE AS DIRECTED TO TEST TWO TIMES DAILY   rosuvastatin  (CRESTOR ) 20 MG tablet TAKE 1 TABLET EVERY DAY (NEED MD APPOINTMENT)   tamsulosin  (FLOMAX ) 0.4 MG CAPS capsule Take 0.4 mg by mouth every evening.    VITAMIN D  PO Take 1,000 Units by mouth daily.   No facility-administered encounter medications on file as of 06/01/2023.    Allergies (verified) Patient has  no active allergies.   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   Chronic LBP 02/08/2011   DDD (degenerative disc disease), cervical 04/11/2011   Diabetes mellitus type 2, diet-controlled (HCC)    on meds   Diverticulosis    DVT (deep venous thrombosis) (HCC)    RLE   Elevated PSA  02/08/2011   GERD (gastroesophageal reflux disease) 02/08/2011   with certain foods-on meds   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/2011   pt unaware   History of colon polyps    History of kidney stones    History of prostatitis 04/11/2011   Hyperlipidemia    on meds   Hypertension    on meds   Lumbar disc disease 02/06/2011   Vitamin D  deficiency    Wears partial dentures    upper and lower   Past Surgical History:  Procedure Laterality Date   BACK SURGERY     x 3  L4-L5   COLONOSCOPY  2007   Dr Lincoln Renshaw   COLONOSCOPY  2017   SA-MAC-suprep(good)-SSP x 1   CYST EXCISION     GOLD SEED IMPLANT N/A 05/26/2022   Procedure: GOLD SEED IMPLANT;  Surgeon: Samson Croak, MD;  Location: [facility];  Service: Urology;  Laterality: N/A;   POLYPECTOMY     SPP x 1   PROSTATE BIOPSY     SPACE OAR INSTILLATION N/A 05/26/2022   Procedure: SPACE OAR INSTILLATION;  Surgeon: Samson Croak, MD;  Location: [facility];  Service: Urology;  Laterality: N/A;   THULIUM LASER TURP (TRANSURETHRAL RESECTION OF PROSTATE) N/A 10/27/2018   Procedure: THULIUM LASER TURP (TRANSURETHRAL RESECTION OF PROSTATE);  Surgeon: Christina Coyer, MD;  Location: [facility];  Service: Urology;  Laterality: N/A;   WISDOM TOOTH EXTRACTION     Family History  Problem Relation Age of Onset   Diabetes Mother    Kidney failure Mother    Kidney failure Brother    Diabetes Brother    Blindness Other    Heart disease Other    Hypertension Other    Diabetes Other    Mental illness Other    Sudden death Other    Colon cancer Neg Hx    Esophageal cancer Neg Hx    Stomach cancer Neg Hx    Rectal cancer Neg Hx    Colon polyps Neg Hx    Social History   Socioeconomic History   Marital status: Married    Spouse name: Ivin Marrow   Number of children: Not on file   Years of education: Not on file   Highest education level: Not on file  Occupational History    Occupation: RETIRED  Tobacco Use   Smoking status: Former    Current packs/day: 0.00    Types: Cigarettes    Quit date: 05/21/1963    Years since quitting: 60.0   Smokeless tobacco: Never  Vaping Use   Vaping status: Never Used  Substance and Sexual Activity   Alcohol use: No    Alcohol/week: 0.0 standard drinks of alcohol   Drug use: No   Sexual activity: Yes  Other Topics Concern   Not on  file  Social History Narrative   Lives with wife/2025   Social Drivers of Health   Financial Resource Strain: Low Risk  (06/01/2023)   Overall Financial Resource Strain (CARDIA)    Difficulty of Paying Living Expenses: Not hard at all  Food Insecurity: No Food Insecurity (06/01/2023)   Hunger Vital Sign    Worried About Running Out of Food in the Last Year: Never true    Ran Out of Food in the Last Year: Never true  Transportation Needs: No Transportation Needs (06/01/2023)   PRAPARE - Administrator, Civil Service (Medical): No    Lack of Transportation (Non-Medical): No  Physical Activity: Sufficiently Active (06/01/2023)   Exercise Vital Sign    Days of Exercise per Week: 5 days    Minutes of Exercise per Session: 40 min  Stress: No Stress Concern Present (06/01/2023)   [employer] of Occupational Health - Occupational Stress Questionnaire    Feeling of Stress : Not at all  Social Connections: Socially Integrated (06/01/2023)   Social Connection and Isolation Panel [NHANES]    Frequency of Communication with Friends and Family: More than three times a week    Frequency of Social Gatherings with Friends and Family: Once a week    Attends Religious Services: More than 4 times per year    Active Member of [employer] or Organizations: Yes    Attends Banker Meetings: Never    Marital Status: Married    Tobacco Counseling Counseling given: Not Answered    Clinical Intake:  Pre-visit preparation completed: Yes        BMI - recorded: 26.43 Nutritional  Status: BMI 25 -29 Overweight Nutritional Risks: None Diabetes: Yes CBG done?: No CBG resulted in Enter/ Edit results?: No Did pt. bring in CBG monitor from home?: No  Lab Results  Component Value Date   HGBA1C 7.0 (H) 07/26/2022   HGBA1C 7.2 (H) 10/01/2021   HGBA1C 7.1 (A) 04/07/2021     How often do you need to have someone help you when you read instructions, pamphlets, or other written materials from your doctor or pharmacy?: 5 - Always  Interpreter Needed?: No  Information entered by :: Delshawn Stech, RMA   Activities of Daily Living     06/01/2023    9:28 AM  In your present state of health, do you have any difficulty performing the following activities:  Hearing? 1  Comment Per pt-Rt ear build up of wax  Vision? 0  Difficulty concentrating or making decisions? 0  Walking or climbing stairs? 0  Dressing or bathing? 0  Doing errands, shopping? 0  Comment wife drives him around  Preparing Food and eating ? N  Using the Toilet? N  In the past six months, have you accidently leaked urine? Y  Do you have problems with loss of bowel control? N  Managing your Medications? N  Managing your Finances? N  Housekeeping or managing your Housekeeping? N    Patient Care Team: Roslyn Coombe, MD as PCP - General (Internal Medicine) Elna Haggis, MD as Consulting Physician (Neurosurgery) Armbruster, Lendon Queen, MD as Consulting Physician (Gastroenterology) Christina Coyer, MD as Consulting Physician (Urology) Albert Huff, MD as Consulting Physician (Ophthalmology) Katheleen Palmer, RN as Oncology Nurse Navigator Neda Balk, RN as Registered Nurse [facility]  Indicate any recent Medical Services you may have received from other than [facility] providers in the past year (date may be approximate).     Assessment:  This is a routine wellness examination for [place].  Hearing/Vision screen Hearing Screening - Comments:: Per pt-Rt ear build up of  wax Vision Screening - Comments::  Blindness     Goals Addressed             This Visit's Progress    To maintain my health.   On track      Depression Screen     06/01/2023    9:41 AM 07/26/2022   11:11 AM 11/16/2021    1:29 PM 10/08/2021   11:19 AM 09/30/2021   11:01 AM 02/17/2021    8:17 AM 04/22/2020    9:55 AM  PHQ 2/9 Scores  PHQ - 2 Score 0 0 0 0 0 0 1  PHQ- 9 Score 1  0 0       Fall Risk     06/01/2023    9:38 AM 07/26/2022   11:11 AM 11/16/2021    1:28 PM 10/08/2021   11:19 AM 09/30/2021   10:52 AM  Fall Risk   Falls in the past year? 0 0 0 0 0  Number falls in past yr: 0 0 0  0  Injury with Fall? 0 0 0 0 0  Risk for fall due to : No Fall Risks No Fall Risks No Fall Risks No Fall Risks No Fall Risks  Follow up Falls prevention discussed;Falls evaluation completed Falls evaluation completed Falls evaluation completed Falls evaluation completed Falls prevention discussed    MEDICARE RISK AT HOME:  Medicare Risk at Home Any stairs in or around the home?: Yes (outside in front) If so, are there any without handrails?: Yes Home free of loose throw rugs in walkways, pet beds, electrical cords, etc?: Yes Adequate lighting in your home to reduce risk of falls?: Yes Life alert?: No Use of a cane, walker or w/c?: Yes (cane) Grab bars in the bathroom?: Yes Shower chair or bench in shower?: No Elevated toilet seat or a handicapped toilet?: No  TIMED UP AND GO:  Was the test performed?  No  Cognitive Function: 6CIT completed        06/01/2023    9:43 AM 09/30/2021   11:03 AM  6CIT Screen  What Year? 0 points 0 points  What month? 0 points 0 points  What time? 0 points 0 points  Count back from 20 0 points 0 points  Months in reverse 0 points 0 points  Repeat phrase 0 points 0 points  Total Score 0 points 0 points    Immunizations Immunization History  Administered Date(s) Administered   Influenza, High Dose Seasonal PF 10/14/2018   Influenza,inj,Quad  PF,6+ Mos 12/18/2019   Influenza-Unspecified 12/12/2009, 10/29/2020   Moderna SARS-COV2 Booster Vaccination 12/07/2019   Moderna Sars-Covid-2 Vaccination 03/20/2019, 04/18/2019   Pneumococcal Conjugate-13 02/13/2004, 08/01/2012   Pneumococcal Polysaccharide-23 09/10/2014   Td 06/15/2010   Zoster, Live 06/11/2009    Screening Tests Health Maintenance  Topic Date Due   Zoster Vaccines- Shingrix (1 of 2) 01[birth year]24/1965   OPHTHALMOLOGY EXAM  10/08/2018   COVID-19 Vaccine (3 - Moderna risk series) 01/04/2020   DTaP/Tdap/Td (2 - Tdap) 06/14/2020   HEMOGLOBIN A1C  01/25/2023   Diabetic kidney evaluation - eGFR measurement  07/26/2023   Diabetic kidney evaluation - Urine ACR  07/26/2023   FOOT EXAM  07/26/2023   INFLUENZA VACCINE  09/02/2023   Medicare Annual Wellness (AWV)  05/31/2024   Colonoscopy  12/31/2025   Pneumonia Vaccine 98+ Years old  Completed   Hepatitis  C Screening  Completed   HPV VACCINES  Aged Out   Meningococcal B Vaccine  Aged Out    Health Maintenance  Health Maintenance Due  Topic Date Due   Zoster Vaccines- Shingrix (1 of 2) 01[birth year]24/1965   OPHTHALMOLOGY EXAM  10/08/2018   COVID-19 Vaccine (3 - Moderna risk series) 01/04/2020   DTaP/Tdap/Td (2 - Tdap) 06/14/2020   HEMOGLOBIN A1C  01/25/2023   Health Maintenance Items Addressed: A1C, See Nurse Notes  Additional Screening:  Vision Screening: Recommended annual ophthalmology exams for early detection of glaucoma and other disorders of the eye.  Dental Screening: Recommended annual dental exams for proper oral hygiene  Community Resource Referral / Chronic Care Management: CRR required this visit?  No   CCM required this visit?  No     Plan:     I have personally reviewed and noted the following in the patient's chart:   Medical and social history Use of alcohol, tobacco or illicit drugs  Current medications and supplements including opioid prescriptions. Patient is not currently taking opioid  prescriptions. Functional ability and status Nutritional status Physical activity Advanced directives List of other physicians Hospitalizations, surgeries, and ER visits in previous 12 months Vitals Screenings to include cognitive, depression, and falls Referrals and appointments  In addition, I have reviewed and discussed with patient certain preventive protocols, quality metrics, and best practice recommendations. A written personalized care plan for preventive services as well as general preventive health recommendations were provided to patient.     Ryeleigh Santore L Olney Monier, CMA   06/01/2023   After Visit Summary: (MyChart) Due to this being a telephonic visit, the after visit summary with patients personalized plan was offered to patient via MyChart   Notes: Please refer to Routing Comments.

## 2023-06-01 NOTE — Patient Instructions (Signed)
 Vincent [name] , Thank you for taking time to come for your Medicare Wellness Visit. I appreciate your ongoing commitment to your health goals. Please review the following plan we discussed and let me know if I can assist you in the future.   Referrals/Orders/Follow-Ups/Clinician Recommendations: It was nice talking with you today  This is a list of the screening recommended for you and due dates:  Health Maintenance  Topic Date Due   Zoster (Shingles) Vaccine (1 of 2) 01/24/1965   Eye exam for diabetics  10/08/2018   COVID-19 Vaccine (3 - Moderna risk series) 01/04/2020   DTaP/Tdap/Td vaccine (2 - Tdap) 06/14/2020   Medicare Annual Wellness Visit  10/01/2022   Hemoglobin A1C  01/25/2023   Yearly kidney function blood test for diabetes  07/26/2023   Yearly kidney health urinalysis for diabetes  07/26/2023   Complete foot exam   07/26/2023   Flu Shot  09/02/2023   Colon Cancer Screening  12/31/2025   Pneumonia Vaccine  Completed   Hepatitis C Screening  Completed   HPV Vaccine  Aged Out   Meningitis B Vaccine  Aged Out    Advanced directives: (Copy Requested) Please bring a copy of your health care power of attorney and living will to the office to be added to your chart at your convenience. You can mail to [facility] [address] [place], [place] [address] or email to [email or web address]  Next Medicare Annual Wellness Visit scheduled for next year: Yes

## 2023-07-14 DIAGNOSIS — H16143 Punctate keratitis, bilateral: Secondary | ICD-10-CM | POA: Diagnosis not present

## 2023-07-14 DIAGNOSIS — H547 Unspecified visual loss: Secondary | ICD-10-CM | POA: Diagnosis not present

## 2023-07-28 ENCOUNTER — Ambulatory Visit: Payer: Self-pay | Admitting: Internal Medicine

## 2023-07-28 ENCOUNTER — Ambulatory Visit: Payer: Medicare HMO | Admitting: Internal Medicine

## 2023-07-28 ENCOUNTER — Encounter: Payer: Self-pay | Admitting: Internal Medicine

## 2023-07-28 VITALS — BP 124/72 | HR 70 | Temp 97.9°F | Ht 69.0 in | Wt 174.0 lb

## 2023-07-28 DIAGNOSIS — E1165 Type 2 diabetes mellitus with hyperglycemia: Secondary | ICD-10-CM | POA: Diagnosis not present

## 2023-07-28 DIAGNOSIS — Z7984 Long term (current) use of oral hypoglycemic drugs: Secondary | ICD-10-CM | POA: Diagnosis not present

## 2023-07-28 DIAGNOSIS — E559 Vitamin D deficiency, unspecified: Secondary | ICD-10-CM | POA: Diagnosis not present

## 2023-07-28 DIAGNOSIS — R972 Elevated prostate specific antigen [PSA]: Secondary | ICD-10-CM | POA: Diagnosis not present

## 2023-07-28 DIAGNOSIS — Z0001 Encounter for general adult medical examination with abnormal findings: Secondary | ICD-10-CM

## 2023-07-28 DIAGNOSIS — E538 Deficiency of other specified B group vitamins: Secondary | ICD-10-CM | POA: Diagnosis not present

## 2023-07-28 DIAGNOSIS — Z0000 Encounter for general adult medical examination without abnormal findings: Secondary | ICD-10-CM | POA: Diagnosis not present

## 2023-07-28 DIAGNOSIS — E7800 Pure hypercholesterolemia, unspecified: Secondary | ICD-10-CM

## 2023-07-28 LAB — MICROALBUMIN / CREATININE URINE RATIO
Creatinine,U: 220.8 mg/dL
Microalb Creat Ratio: 230.8 mg/g — ABNORMAL HIGH (ref 0.0–30.0)
Microalb, Ur: 51 mg/dL — ABNORMAL HIGH (ref 0.0–1.9)

## 2023-07-28 LAB — HEPATIC FUNCTION PANEL
ALT: 12 U/L (ref 0–53)
AST: 16 U/L (ref 0–37)
Albumin: 4.3 g/dL (ref 3.5–5.2)
Alkaline Phosphatase: 69 U/L (ref 39–117)
Bilirubin, Direct: 0.1 mg/dL (ref 0.0–0.3)
Total Bilirubin: 0.3 mg/dL (ref 0.2–1.2)
Total Protein: 7.1 g/dL (ref 6.0–8.3)

## 2023-07-28 LAB — URINALYSIS, ROUTINE W REFLEX MICROSCOPIC
Bilirubin Urine: NEGATIVE
Hgb urine dipstick: NEGATIVE
Nitrite: NEGATIVE
RBC / HPF: NONE SEEN (ref 0–?)
Specific Gravity, Urine: >=1.03 — AB (ref 1.000–1.030)
Total Protein, Urine: 100 — AB
Urine Glucose: NEGATIVE
Urobilinogen, UA: 0.2 (ref 0.0–1.0)
pH: 6 (ref 5.0–8.0)

## 2023-07-28 LAB — LIPID PANEL
Cholesterol: 101 mg/dL (ref 0–200)
HDL: 48.4 mg/dL (ref >39.00)
LDL Cholesterol: 24 mg/dL (ref 0–99)
NonHDL: 52.75
Total CHOL/HDL Ratio: 2
Triglycerides: 145 mg/dL (ref 0.0–149.0)
VLDL: 29 mg/dL (ref 0.0–40.0)

## 2023-07-28 LAB — BASIC METABOLIC PANEL WITH GFR
BUN: 17 mg/dL (ref 6–23)
CO2: 27 meq/L (ref 19–32)
Calcium: 10.1 mg/dL (ref 8.4–10.5)
Chloride: 105 meq/L (ref 96–112)
Creatinine, Ser: 1.06 mg/dL (ref 0.40–1.50)
GFR: 67.7 mL/min (ref >60.00)
Glucose, Bld: 82 mg/dL (ref 70–99)
Potassium: 4.6 meq/L (ref 3.5–5.1)
Sodium: 138 meq/L (ref 135–145)

## 2023-07-28 LAB — CBC WITH DIFFERENTIAL/PLATELET
Basophils Absolute: 0 10*3/uL (ref 0.0–0.1)
Basophils Relative: 0.4 % (ref 0.0–3.0)
Eosinophils Absolute: 0.2 10*3/uL (ref 0.0–0.7)
Eosinophils Relative: 4.1 % (ref 0.0–5.0)
HCT: 37.8 % — ABNORMAL LOW (ref 39.0–52.0)
Hemoglobin: 12 g/dL — ABNORMAL LOW (ref 13.0–17.0)
Lymphocytes Relative: 24.6 % (ref 12.0–46.0)
Lymphs Abs: 1.1 10*3/uL (ref 0.7–4.0)
MCHC: 31.9 g/dL (ref 30.0–36.0)
MCV: 83.7 fl (ref 78.0–100.0)
Monocytes Absolute: 0.4 10*3/uL (ref 0.1–1.0)
Monocytes Relative: 9.7 % (ref 3.0–12.0)
Neutro Abs: 2.6 10*3/uL (ref 1.4–7.7)
Neutrophils Relative %: 61.2 % (ref 43.0–77.0)
Platelets: 195 10*3/uL (ref 150.0–400.0)
RBC: 4.52 Mil/uL (ref 4.22–5.81)
RDW: 14.7 % (ref 11.5–15.5)
WBC: 4.3 10*3/uL (ref 4.0–10.5)

## 2023-07-28 LAB — PSA: PSA: 2.22 ng/mL (ref 0.10–4.00)

## 2023-07-28 LAB — HEMOGLOBIN A1C: Hgb A1c MFr Bld: 6.9 % — ABNORMAL HIGH (ref 4.6–6.5)

## 2023-07-28 LAB — VITAMIN B12: Vitamin B-12: 341 pg/mL (ref 211–911)

## 2023-07-28 LAB — TSH: TSH: 1.31 u[IU]/mL (ref 0.35–5.50)

## 2023-07-28 LAB — VITAMIN D 25 HYDROXY (VIT D DEFICIENCY, FRACTURES): VITD: 91.24 ng/mL (ref 30.00–100.00)

## 2023-07-28 NOTE — Progress Notes (Signed)
 Patient ID: Vincent [name], male   DOB: Jul 04, 1945, 78 y.o.   MRN: [id number]         Chief Complaint:: wellness exam and low vit d, hld, elevated psa, dm,        HPI:  Vincent [name] is a 78 y.o. male here for wellness exam, saw optho 2 wks ago Dr Octavia; for shingrix and tdap at pharmacy, o/w up to date                        Also Pt denies chest pain, increased sob or doe, wheezing, orthopnea, PND, increased LE swelling, palpitations, dizziness or syncope.   Pt denies polydipsia, polyuria, or new focal neuro s/s.    Pt denies fever, wt loss, night sweats, loss of appetite, or other constitutional symptoms  Denies urinary symptoms such as dysuria, frequency, urgency, flank pain, hematuria or n/v, fever, chills.   Wt Readings from Last 3 Encounters:  07/28/23 174 lb (78.9 kg)  06/01/23 179 lb (81.2 kg)  05/12/23 179 lb 3.2 oz (81.3 kg)   BP Readings from Last 3 Encounters:  07/28/23 124/72  05/12/23 120/72  07/26/22 124/78   Immunization History  Administered Date(s) Administered   Influenza, High Dose Seasonal PF 10/14/2018   Influenza,inj,Quad PF,6+ Mos 12/18/2019   Influenza-Unspecified 12/12/2009, 10/29/2020   Moderna SARS-COV2 Booster Vaccination 12/07/2019   Moderna Sars-Covid-2 Vaccination 03/20/2019, 04/18/2019   Pneumococcal Conjugate-13 02/13/2004, 08/01/2012   Pneumococcal Polysaccharide-23 09/10/2014   Td 06/15/2010   Zoster, Live 06/11/2009   Health Maintenance Due  Topic Date Due   Zoster Vaccines- Shingrix (1 of 2) 01/24/1965   DTaP/Tdap/Td (2 - Tdap) 06/14/2020      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, 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   Chronic LBP 02/08/2011   DDD (degenerative disc disease), cervical 04/11/2011   Diabetes  mellitus type 2, diet-controlled (HCC)    on meds   Diverticulosis    DVT (deep venous thrombosis) (HCC)    RLE   Elevated PSA 02/08/2011   GERD (gastroesophageal reflux disease) 02/08/2011   with certain foods-on meds   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/2011   pt unaware   History of colon polyps    History of kidney stones    History of prostatitis 04/11/2011   Hyperlipidemia    on meds   Hypertension    on meds   Lumbar disc disease 02/06/2011   Vitamin D  deficiency    Wears partial dentures    upper and lower   Past Surgical History:  Procedure Laterality Date   BACK SURGERY     x 3  L4-L5   COLONOSCOPY  2007   Dr Vicci   COLONOSCOPY  2017   SA-MAC-suprep(good)-SSP x 1   CYST EXCISION     GOLD SEED IMPLANT N/A 05/26/2022   Procedure: GOLD SEED IMPLANT;  Surgeon: Carolee Sherwood JONETTA DOUGLAS, MD;  Location: [facility];  Service: Urology;  Laterality: N/A;   POLYPECTOMY     SPP x 1   PROSTATE BIOPSY     SPACE OAR INSTILLATION N/A 05/26/2022   Procedure: SPACE OAR INSTILLATION;  Surgeon: Carolee Sherwood JONETTA DOUGLAS, MD;  Location: [facility]  [place];  Service: Urology;  Laterality: N/A;   THULIUM LASER TURP (TRANSURETHRAL RESECTION OF PROSTATE) N/A 10/27/2018   Procedure: THULIUM LASER TURP (TRANSURETHRAL RESECTION OF PROSTATE);  Surgeon: Nieves Cough, MD;  Location: [facility];  Service: Urology;  Laterality: N/A;   WISDOM TOOTH EXTRACTION      reports that he quit smoking about 60 years ago. His smoking use included cigarettes. He has never used smokeless tobacco. He reports that he does not drink alcohol and does not use drugs. family history includes Blindness in an other family member; Diabetes in his brother, mother, and another family member; Heart disease in an other family member; Hypertension in an other family member; Kidney failure in his brother and mother; Mental illness in an other family member; Sudden death in an other family  member. No Active Allergies Current Outpatient Medications on File Prior to Visit  Medication Sig Dispense Refill   amLODipine  (NORVASC ) 5 MG tablet TAKE 1 TABLET EVERY DAY 90 tablet 3   aspirin  81 MG EC tablet Take 1 tablet (81 mg total) by mouth daily. Swallow whole. 30 tablet 12   Blood Glucose Monitoring Suppl (PRODIGY VOICE BLOOD GLUCOSE) w/Device KIT 1 each by Does not apply route 2 (two) times daily. Use as directed twice daily E11.9 1 kit 0   dapagliflozin  propanediol (FARXIGA ) 5 MG TABS tablet Take 1 tablet (5 mg total) by mouth daily before breakfast. 90 tablet 3   finasteride  (PROSCAR ) 5 MG tablet TK 1 T PO QHS 90 tablet 0   glucose blood (PRODIGY NO CODING BLOOD GLUC) test strip TEST BLOOD SUGAR TWO TIMES DAILY AS DIRECTED 200 strip 3   ibuprofen  (ADVIL ) 800 MG tablet Take 1 tablet (800 mg total) by mouth every 8 (eight) hours as needed. 30 tablet 1   metFORMIN  (GLUCOPHAGE -XR) 500 MG 24 hr tablet TAKE 2 TABLETS EVERY DAY WITH BREAKFAST 180 tablet 3   Prodigy Twist Top Lancets 28G MISC USE AS DIRECTED TO TEST TWO TIMES DAILY 200 each 3   rosuvastatin  (CRESTOR ) 20 MG tablet TAKE 1 TABLET EVERY DAY (NEED MD APPOINTMENT) 90 tablet 3   tamsulosin  (FLOMAX ) 0.4 MG CAPS capsule Take 0.4 mg by mouth every evening.   8   VITAMIN D  PO Take 1,000 Units by mouth daily.     No current facility-administered medications on file prior to visit.        ROS:  All others reviewed and negative.  Objective        PE:  BP 124/72 (BP Location: Right Arm, Patient Position: Sitting, Cuff Size: Normal)   Pulse 70   Temp 97.9 F (36.6 C) (Oral)   Ht 5' 9 (1.753 m)   Wt 174 lb (78.9 kg)   SpO2 98%   BMI 25.70 kg/m                 Constitutional: Pt appears in NAD               HENT: Head: NCAT.                Right Ear: External ear normal.                 Left Ear: External ear normal.                Eyes: . Pupils are equal, round, and reactive to light. Conjunctivae and EOM are normal  Nose: without d/c or deformity               Neck: Neck supple. Gross normal ROM               Cardiovascular: Normal rate and regular rhythm.                 Pulmonary/Chest: Effort normal and breath sounds without rales or wheezing.                Abd:  Soft, NT, ND, + BS, no organomegaly               Neurological: Pt is alert. At baseline orientation, motor grossly intact               Skin: Skin is warm. No rashes, no other new lesions, LE edema - none               Psychiatric: Pt behavior is normal without agitation   Micro: none  Cardiac tracings I have personally interpreted today:  none  Pertinent Radiological findings (summarize): none   Lab Results  Component Value Date   WBC 4.3 07/28/2023   HGB 12.0 (L) 07/28/2023   HCT 37.8 (L) 07/28/2023   PLT 195.0 07/28/2023   GLUCOSE 82 07/28/2023   CHOL 101 07/28/2023   TRIG 145.0 07/28/2023   HDL 48.40 07/28/2023   LDLCALC 24 07/28/2023   ALT 12 07/28/2023   AST 16 07/28/2023   NA 138 07/28/2023   K 4.6 07/28/2023   CL 105 07/28/2023   CREATININE 1.06 07/28/2023   BUN 17 07/28/2023   CO2 27 07/28/2023   TSH 1.31 07/28/2023   PSA 2.22 07/28/2023   HGBA1C 6.9 (H) 07/28/2023   MICROALBUR 51.0 (H) 07/28/2023   Assessment/Plan:  Vincent [name] is a 78 y.o. Black or African American [2] male with  has a past medical history of Allergic rhinitis, cause unspecified (02/08/2011), Anemia, unspecified (04/11/2011), Arthritis, Blindness (02/06/2011), BPH (benign prostatic hypertrophy) (02/08/2011), Cataract, Chronic LBP (02/08/2011), DDD (degenerative disc disease), cervical (04/11/2011), Diabetes mellitus type 2, diet-controlled (HCC), Diverticulosis, DVT (deep venous thrombosis) (HCC), Elevated PSA (02/08/2011), GERD (gastroesophageal reflux disease) (02/08/2011), Hepatitis B antibody positive (04/11/2011), History of colon polyps, History of kidney stones, History of prostatitis (04/11/2011), Hyperlipidemia,  Hypertension, Lumbar disc disease (02/06/2011), Vitamin D  deficiency, and Wears partial dentures.  Encounter for well adult exam with abnormal findings Age and sex appropriate education and counseling updated with regular exercise and diet Referrals for preventative services - none needed Immunizations addressed - for shingrix and tdap at pharmacy Smoking counseling  - none needed Evidence for depression or other mood disorder - none significant Most recent labs reviewed. I have personally reviewed and have noted: 1) the patient's medical and social history 2) The patient's current medications and supplements 3) The patient's height, weight, and BMI have been recorded in the chart   Vitamin D  deficiency Last vitamin D  Lab Results  Component Value Date   VD25OH 91.24 07/28/2023   Stable, cont oral replacement   Hyperlipidemia Lab Results  Component Value Date   LDLCALC 24 07/28/2023   Stable, pt to continue current statin crestor  20 qd   Elevated PSA Also for f/u psa  Diabetes (HCC) Lab Results  Component Value Date   HGBA1C 6.9 (H) 07/28/2023   Stable, pt to continue current medical treatment farxiga  5 every day, metformin  ER 500 mg - 2 qd  Followup: Return in about 1 year (around 07/27/2024).  Vincent Rush, MD 07/31/2023 2:31 PM [facility]

## 2023-07-28 NOTE — Progress Notes (Signed)
 The test results show that your current treatment is OK, as the tests are stable.  Please continue the same plan.  There is no other need for change of treatment or further evaluation based on these results, at this time.  thanks

## 2023-07-28 NOTE — Patient Instructions (Addendum)
 Please have your Shingrix (shingles) shots done at your local pharmacy., and the Tdap tetanus shot as well  Please continue all other medications as before, and refills have been done if requested.  Please have the pharmacy call with any other refills you may need.  Please continue your efforts at being more active, low cholesterol diet, and weight control.  You are otherwise up to date with prevention measures today.  Please keep your appointments with your specialists as you may have planned  Please go to the LAB at the blood drawing area for the tests to be done  You will be contacted by phone if any changes need to be made immediately.  Otherwise, you will receive a letter about your results with an explanation, but please check with MyChart first.  Please make an Appointment to return for your 1 year visit, or sooner if needed

## 2023-07-31 ENCOUNTER — Encounter: Payer: Self-pay | Admitting: Internal Medicine

## 2023-07-31 NOTE — Assessment & Plan Note (Signed)
Age and sex appropriate education and counseling updated with regular exercise and diet Referrals for preventative services - none needed Immunizations addressed - for shingrix and tdap at pharmacy Smoking counseling  - none needed Evidence for depression or other mood disorder - none significant Most recent labs reviewed. I have personally reviewed and have noted: 1) the patient's medical and social history 2) The patient's current medications and supplements 3) The patient's height, weight, and BMI have been recorded in the chart  

## 2023-07-31 NOTE — Assessment & Plan Note (Signed)
Also for f/u psa 

## 2023-07-31 NOTE — Assessment & Plan Note (Signed)
 Lab Results  Component Value Date   LDLCALC 24 07/28/2023   Stable, pt to continue current statin crestor  20 qd

## 2023-07-31 NOTE — Assessment & Plan Note (Signed)
 Lab Results  Component Value Date   HGBA1C 6.9 (H) 07/28/2023   Stable, pt to continue current medical treatment farxiga  5 every day, metformin  ER 500 mg - 2 qd

## 2023-07-31 NOTE — Assessment & Plan Note (Signed)
 Last vitamin D  Lab Results  Component Value Date   VD25OH 91.24 07/28/2023   Stable, cont oral replacement

## 2023-09-01 IMAGING — CR DG FOOT COMPLETE 3+V*R*
3 series · 3 of 3 positions shown · non-contrast
Comparison: None.

CLINICAL DATA: Right foot swelling

EXAM:
RIGHT FOOT COMPLETE - 3+ VIEW

[x foot ap right]
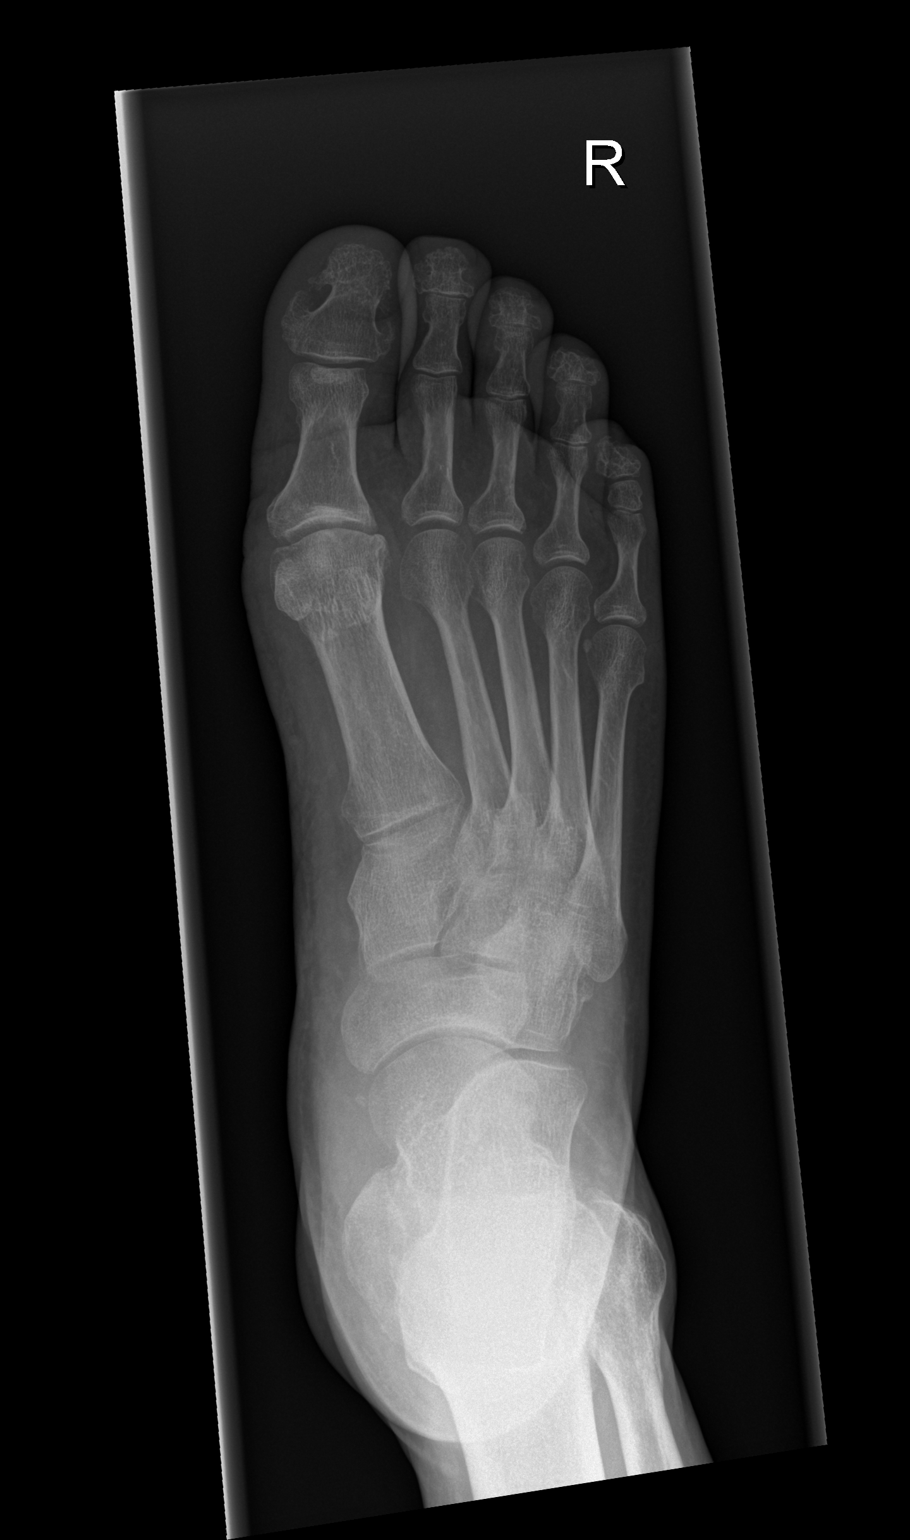

[x foot obl right]
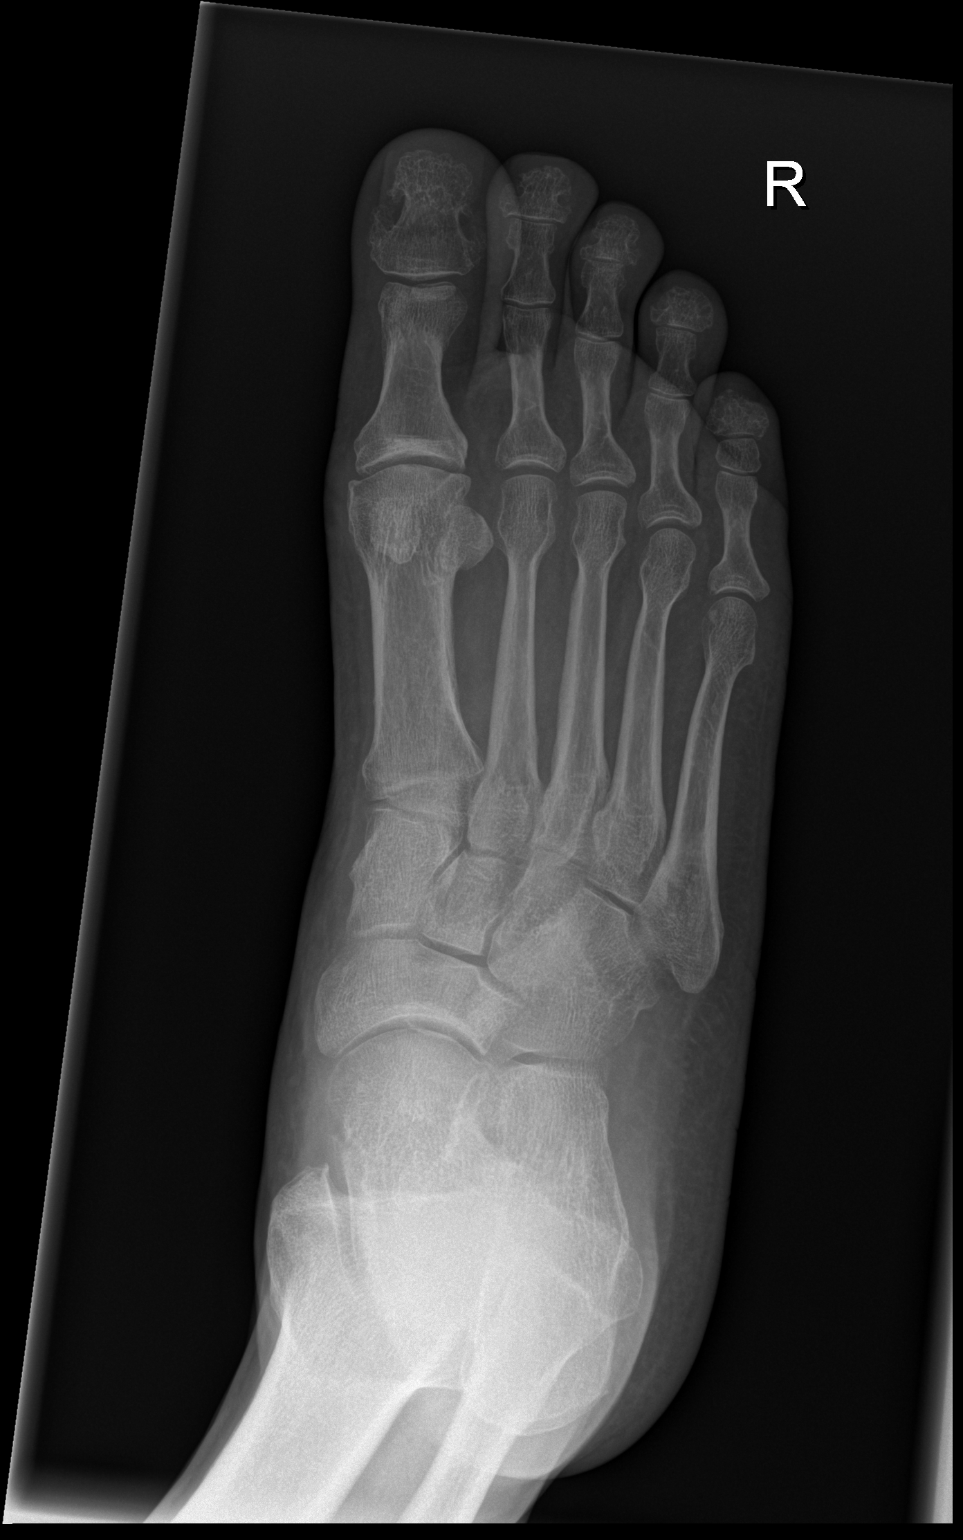

[x foot lat right]
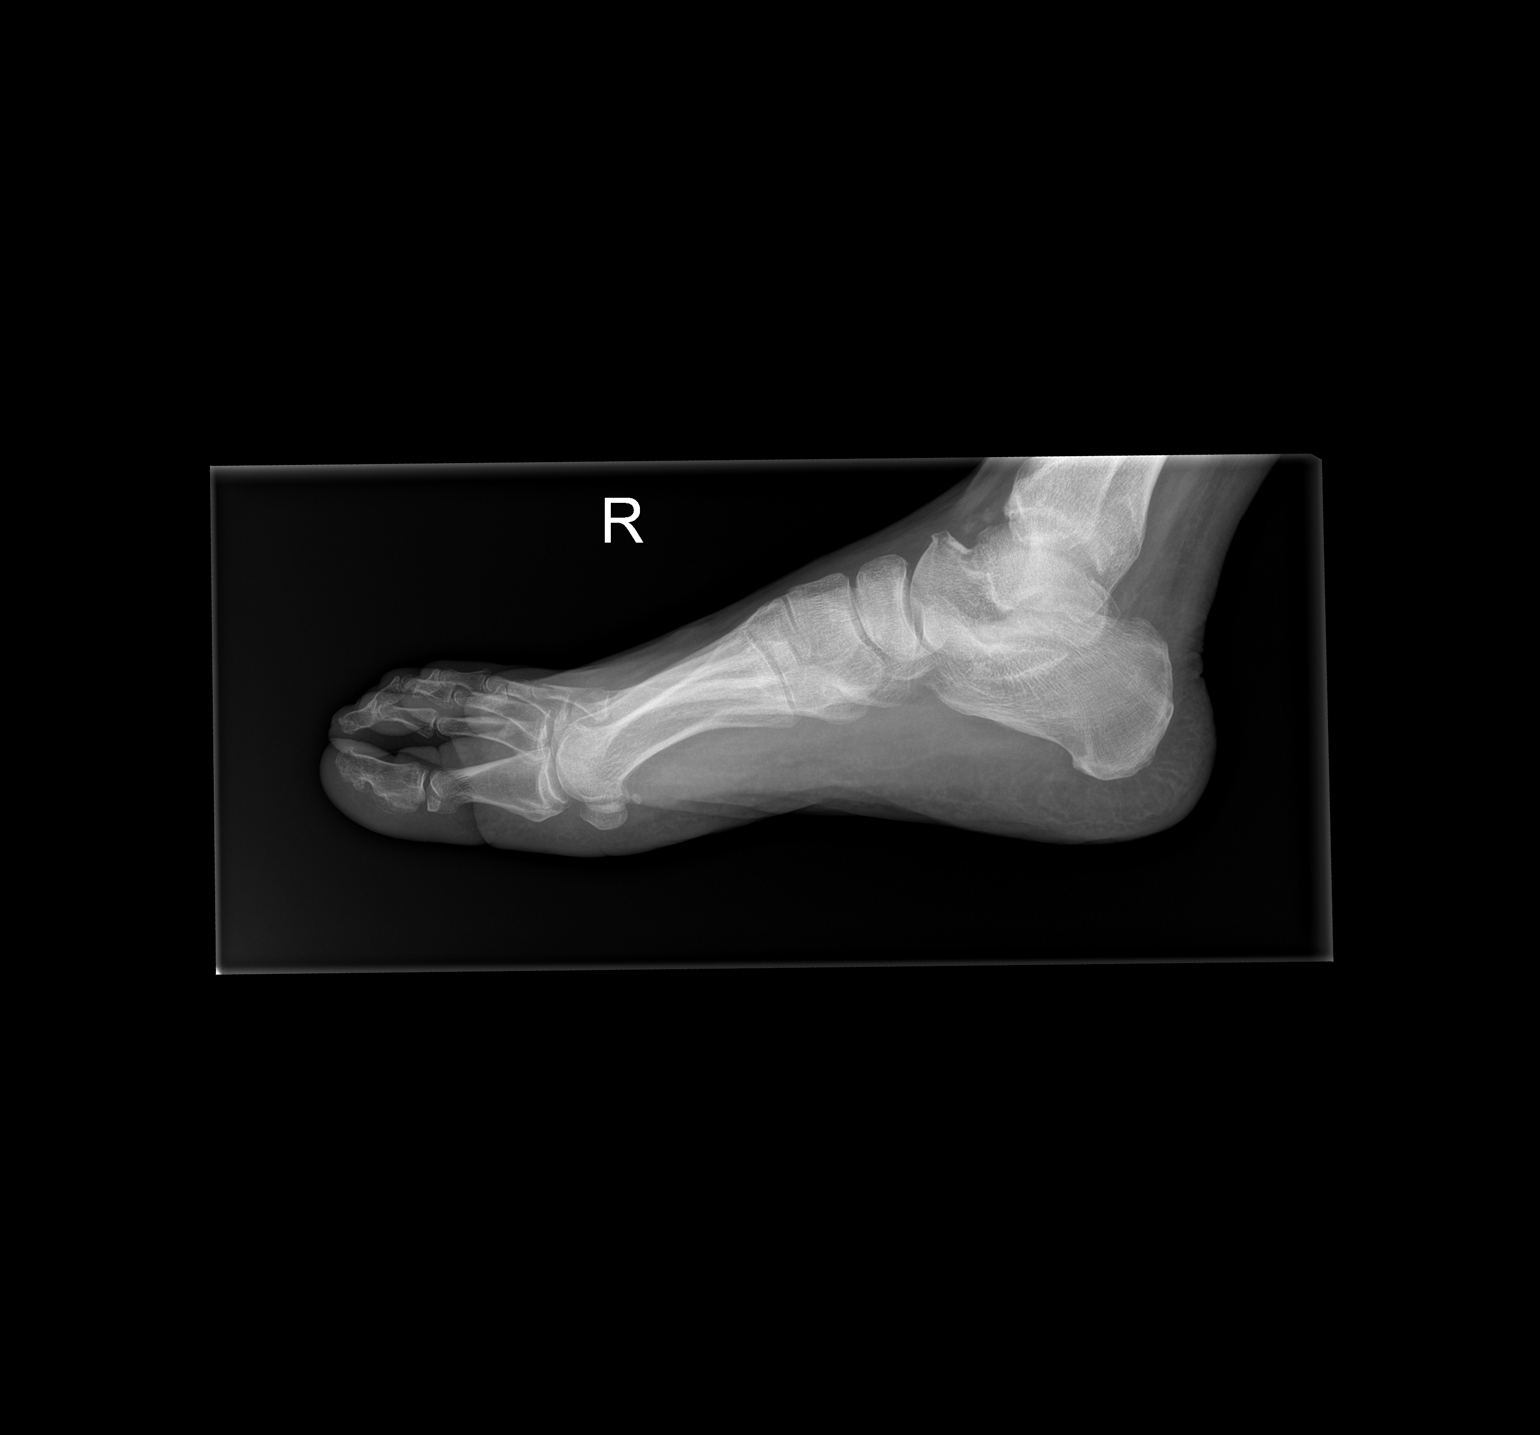

[3 of 3 positions shown; findings below may reference images not displayed]

FINDINGS: Normal alignment. No acute fracture or dislocation. Joint spaces are
preserved. No erosions. Soft tissues are unremarkable.
IMPRESSION: Negative.

## 2023-09-25 ENCOUNTER — Other Ambulatory Visit: Payer: Self-pay | Admitting: Internal Medicine

## 2023-10-27 DIAGNOSIS — C61 Malignant neoplasm of prostate: Secondary | ICD-10-CM | POA: Diagnosis not present

## 2023-11-03 DIAGNOSIS — Z8546 Personal history of malignant neoplasm of prostate: Secondary | ICD-10-CM | POA: Diagnosis not present

## 2023-11-03 DIAGNOSIS — N401 Enlarged prostate with lower urinary tract symptoms: Secondary | ICD-10-CM | POA: Diagnosis not present

## 2023-11-03 DIAGNOSIS — R350 Frequency of micturition: Secondary | ICD-10-CM | POA: Diagnosis not present

## 2023-11-03 DIAGNOSIS — R399 Unspecified symptoms and signs involving the genitourinary system: Secondary | ICD-10-CM | POA: Diagnosis not present

## 2024-01-07 ENCOUNTER — Other Ambulatory Visit: Payer: Self-pay | Admitting: Internal Medicine

## 2024-02-07 ENCOUNTER — Other Ambulatory Visit: Payer: Self-pay | Admitting: Internal Medicine

## 2024-02-07 NOTE — Telephone Encounter (Unsigned)
 Copied from CRM [id number]. Topic: Clinical - Medication Refill >> Feb 07, 2024  2:34 PM Lauren C wrote: Medication: ibuprofen  (ADVIL ) 800 MG tablet  Has the patient contacted their pharmacy? No  This is the patient's preferred pharmacy:  [id number] [address], [place] - [address] AT [facility] & [address] [place] [address] Phone: [phone] Fax: [phone]  Is this the correct pharmacy for this prescription? Yes  Has the prescription been filled recently? Yes  Is the patient out of the medication? No, few left  Has the patient been seen for an appointment in the last year OR does the patient have an upcoming appointment? Yes, last seen 07/28/23  Can we respond through MyChart? Yes  Agent: Please be advised that Rx refills may take up to 3 business days. We ask that you follow-up with your pharmacy.

## 2024-02-08 MED ORDER — IBUPROFEN 800 MG PO TABS: 800.0000 mg | ORAL_TABLET | Freq: Three times a day (TID) | ORAL | 1 refills | Status: AC | PRN

## 2024-02-19 ENCOUNTER — Other Ambulatory Visit: Payer: Self-pay | Admitting: Internal Medicine

## 2024-02-20 ENCOUNTER — Other Ambulatory Visit: Payer: Self-pay

## 2024-06-01 ENCOUNTER — Ambulatory Visit
